# Patient Record
Sex: Female | Born: 1970 | Race: White | Hispanic: No | Smoking: Former smoker
Health system: Southern US, Community
[De-identification: ages and names within clinical notes are randomized; demographics above are authoritative.]

## PROBLEM LIST (undated history)

## (undated) DIAGNOSIS — R55 Syncope and collapse: Secondary | ICD-10-CM

## (undated) DIAGNOSIS — M1990 Unspecified osteoarthritis, unspecified site: Secondary | ICD-10-CM

## (undated) DIAGNOSIS — F32A Depression, unspecified: Secondary | ICD-10-CM

## (undated) DIAGNOSIS — R413 Other amnesia: Secondary | ICD-10-CM

## (undated) DIAGNOSIS — J449 Chronic obstructive pulmonary disease, unspecified: Secondary | ICD-10-CM

## (undated) DIAGNOSIS — R519 Headache, unspecified: Secondary | ICD-10-CM

## (undated) DIAGNOSIS — G9050 Complex regional pain syndrome I, unspecified: Secondary | ICD-10-CM

## (undated) DIAGNOSIS — K449 Diaphragmatic hernia without obstruction or gangrene: Secondary | ICD-10-CM

## (undated) DIAGNOSIS — J189 Pneumonia, unspecified organism: Secondary | ICD-10-CM

## (undated) DIAGNOSIS — R32 Unspecified urinary incontinence: Secondary | ICD-10-CM

## (undated) DIAGNOSIS — I517 Cardiomegaly: Secondary | ICD-10-CM

## (undated) DIAGNOSIS — F4310 Post-traumatic stress disorder, unspecified: Secondary | ICD-10-CM

## (undated) DIAGNOSIS — J45909 Unspecified asthma, uncomplicated: Secondary | ICD-10-CM

## (undated) DIAGNOSIS — M179 Osteoarthritis of knee, unspecified: Secondary | ICD-10-CM

## (undated) DIAGNOSIS — Z8709 Personal history of other diseases of the respiratory system: Secondary | ICD-10-CM

## (undated) DIAGNOSIS — E785 Hyperlipidemia, unspecified: Secondary | ICD-10-CM

## (undated) DIAGNOSIS — G4730 Sleep apnea, unspecified: Secondary | ICD-10-CM

## (undated) DIAGNOSIS — R296 Repeated falls: Secondary | ICD-10-CM

## (undated) DIAGNOSIS — F0781 Postconcussional syndrome: Secondary | ICD-10-CM

## (undated) DIAGNOSIS — K589 Irritable bowel syndrome without diarrhea: Secondary | ICD-10-CM

## (undated) DIAGNOSIS — M1710 Unilateral primary osteoarthritis, unspecified knee: Secondary | ICD-10-CM

## (undated) DIAGNOSIS — Z87442 Personal history of urinary calculi: Secondary | ICD-10-CM

## (undated) DIAGNOSIS — G4700 Insomnia, unspecified: Secondary | ICD-10-CM

## (undated) DIAGNOSIS — R0600 Dyspnea, unspecified: Secondary | ICD-10-CM

## (undated) DIAGNOSIS — F329 Major depressive disorder, single episode, unspecified: Secondary | ICD-10-CM

## (undated) DIAGNOSIS — G319 Degenerative disease of nervous system, unspecified: Secondary | ICD-10-CM

## (undated) DIAGNOSIS — E039 Hypothyroidism, unspecified: Secondary | ICD-10-CM

## (undated) DIAGNOSIS — R569 Unspecified convulsions: Secondary | ICD-10-CM

## (undated) HISTORY — PX: ABDOMINAL HYSTERECTOMY: SHX81

## (undated) HISTORY — PX: KNEE ARTHROSCOPY: SUR90

## (undated) HISTORY — PX: APPENDECTOMY: SHX54

## (undated) HISTORY — PX: REPAIR TENDONS LEG: SUR1210

---
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
## 1998-01-22 HISTORY — PX: LAPAROSCOPIC HYSTERECTOMY: SHX1926

## 1998-04-13 ENCOUNTER — Other Ambulatory Visit: Admission: RE | Admit: 1998-04-13 | Discharge: 1998-04-13 | Payer: Self-pay | Admitting: Obstetrics and Gynecology

## 1998-05-27 ENCOUNTER — Other Ambulatory Visit: Admission: RE | Admit: 1998-05-27 | Discharge: 1998-05-27 | Payer: Self-pay | Admitting: Obstetrics and Gynecology

## 1998-06-14 ENCOUNTER — Encounter: Payer: Self-pay | Admitting: Surgery

## 1998-06-14 ENCOUNTER — Ambulatory Visit (HOSPITAL_COMMUNITY): Admission: RE | Admit: 1998-06-14 | Discharge: 1998-06-14 | Payer: Self-pay | Admitting: Surgery

## 1998-06-15 ENCOUNTER — Ambulatory Visit (HOSPITAL_COMMUNITY): Admission: RE | Admit: 1998-06-15 | Discharge: 1998-06-16 | Payer: Self-pay | Admitting: Surgery

## 1998-09-05 ENCOUNTER — Other Ambulatory Visit: Admission: RE | Admit: 1998-09-05 | Discharge: 1998-09-05 | Payer: Self-pay | Admitting: Obstetrics and Gynecology

## 1998-09-28 ENCOUNTER — Other Ambulatory Visit: Admission: RE | Admit: 1998-09-28 | Discharge: 1998-09-28 | Payer: Self-pay | Admitting: Obstetrics and Gynecology

## 1998-09-28 ENCOUNTER — Encounter (INDEPENDENT_AMBULATORY_CARE_PROVIDER_SITE_OTHER): Payer: Self-pay

## 1999-03-10 ENCOUNTER — Other Ambulatory Visit: Admission: RE | Admit: 1999-03-10 | Discharge: 1999-03-10 | Payer: Self-pay | Admitting: Obstetrics and Gynecology

## 1999-05-02 ENCOUNTER — Encounter (INDEPENDENT_AMBULATORY_CARE_PROVIDER_SITE_OTHER): Payer: Self-pay | Admitting: Specialist

## 1999-05-02 ENCOUNTER — Other Ambulatory Visit: Admission: RE | Admit: 1999-05-02 | Discharge: 1999-05-02 | Payer: Self-pay | Admitting: *Deleted

## 1999-07-02 ENCOUNTER — Emergency Department (HOSPITAL_COMMUNITY): Admission: EM | Admit: 1999-07-02 | Discharge: 1999-07-02 | Payer: Self-pay | Admitting: Emergency Medicine

## 1999-08-10 ENCOUNTER — Observation Stay (HOSPITAL_COMMUNITY): Admission: RE | Admit: 1999-08-10 | Discharge: 1999-08-11 | Payer: Self-pay | Admitting: Obstetrics and Gynecology

## 1999-08-10 ENCOUNTER — Encounter (INDEPENDENT_AMBULATORY_CARE_PROVIDER_SITE_OTHER): Payer: Self-pay

## 1999-10-26 ENCOUNTER — Other Ambulatory Visit: Admission: RE | Admit: 1999-10-26 | Discharge: 1999-10-26 | Payer: Self-pay | Admitting: Obstetrics and Gynecology

## 2000-01-23 HISTORY — PX: FOOT SURGERY: SHX648

## 2000-05-02 ENCOUNTER — Encounter: Admission: RE | Admit: 2000-05-02 | Discharge: 2000-05-02 | Payer: Self-pay | Admitting: General Practice

## 2000-05-02 ENCOUNTER — Encounter: Payer: Self-pay | Admitting: General Practice

## 2000-05-10 ENCOUNTER — Encounter: Payer: Self-pay | Admitting: Urology

## 2000-05-10 ENCOUNTER — Encounter: Admission: RE | Admit: 2000-05-10 | Discharge: 2000-05-10 | Payer: Self-pay | Admitting: Urology

## 2000-12-06 ENCOUNTER — Other Ambulatory Visit: Admission: RE | Admit: 2000-12-06 | Discharge: 2000-12-06 | Payer: Self-pay | Admitting: Obstetrics and Gynecology

## 2001-01-16 ENCOUNTER — Encounter: Admission: RE | Admit: 2001-01-16 | Discharge: 2001-01-16 | Payer: Self-pay | Admitting: Family Medicine

## 2001-01-16 ENCOUNTER — Encounter: Payer: Self-pay | Admitting: Family Medicine

## 2002-05-26 ENCOUNTER — Ambulatory Visit (HOSPITAL_BASED_OUTPATIENT_CLINIC_OR_DEPARTMENT_OTHER): Admission: RE | Admit: 2002-05-26 | Discharge: 2002-05-26 | Payer: Self-pay | Admitting: Orthopedic Surgery

## 2005-01-26 ENCOUNTER — Ambulatory Visit (HOSPITAL_BASED_OUTPATIENT_CLINIC_OR_DEPARTMENT_OTHER): Admission: RE | Admit: 2005-01-26 | Discharge: 2005-01-26 | Payer: Self-pay | Admitting: Orthopedic Surgery

## 2005-01-31 ENCOUNTER — Ambulatory Visit (HOSPITAL_COMMUNITY): Admission: RE | Admit: 2005-01-31 | Discharge: 2005-01-31 | Payer: Self-pay | Admitting: Orthopedic Surgery

## 2005-02-15 ENCOUNTER — Ambulatory Visit (HOSPITAL_BASED_OUTPATIENT_CLINIC_OR_DEPARTMENT_OTHER): Admission: RE | Admit: 2005-02-15 | Discharge: 2005-02-16 | Payer: Self-pay | Admitting: Orthopedic Surgery

## 2006-05-12 ENCOUNTER — Emergency Department (HOSPITAL_COMMUNITY): Admission: EM | Admit: 2006-05-12 | Discharge: 2006-05-12 | Payer: Self-pay | Admitting: Emergency Medicine

## 2006-10-13 ENCOUNTER — Emergency Department (HOSPITAL_COMMUNITY): Admission: EM | Admit: 2006-10-13 | Discharge: 2006-10-13 | Payer: Self-pay | Admitting: Emergency Medicine

## 2006-11-05 ENCOUNTER — Ambulatory Visit: Payer: Self-pay | Admitting: Physical Medicine & Rehabilitation

## 2006-11-05 ENCOUNTER — Encounter
Admission: RE | Admit: 2006-11-05 | Discharge: 2007-02-03 | Payer: Self-pay | Admitting: Physical Medicine & Rehabilitation

## 2006-12-09 ENCOUNTER — Encounter: Admission: RE | Admit: 2006-12-09 | Discharge: 2007-03-09 | Payer: Self-pay | Admitting: Anesthesiology

## 2006-12-10 ENCOUNTER — Ambulatory Visit: Payer: Self-pay | Admitting: Anesthesiology

## 2006-12-23 ENCOUNTER — Ambulatory Visit: Payer: Self-pay | Admitting: Physical Medicine & Rehabilitation

## 2007-01-29 ENCOUNTER — Encounter
Admission: RE | Admit: 2007-01-29 | Discharge: 2007-04-07 | Payer: Self-pay | Admitting: Physical Medicine & Rehabilitation

## 2007-01-29 ENCOUNTER — Ambulatory Visit: Payer: Self-pay | Admitting: Physical Medicine & Rehabilitation

## 2007-02-11 ENCOUNTER — Ambulatory Visit: Payer: Self-pay | Admitting: Anesthesiology

## 2008-03-20 ENCOUNTER — Encounter: Admission: RE | Admit: 2008-03-20 | Discharge: 2008-03-20 | Payer: Self-pay | Admitting: Family Medicine

## 2008-10-04 ENCOUNTER — Encounter: Admission: RE | Admit: 2008-10-04 | Discharge: 2008-10-04 | Payer: Self-pay | Admitting: Emergency Medicine

## 2009-03-02 ENCOUNTER — Emergency Department (HOSPITAL_COMMUNITY): Admission: EM | Admit: 2009-03-02 | Discharge: 2009-03-02 | Payer: Self-pay | Admitting: Emergency Medicine

## 2009-12-28 ENCOUNTER — Emergency Department (HOSPITAL_COMMUNITY)
Admission: EM | Admit: 2009-12-28 | Discharge: 2009-12-29 | Payer: Self-pay | Source: Home / Self Care | Admitting: Emergency Medicine

## 2010-01-22 HISTORY — PX: HIP SURGERY: SHX245

## 2010-02-12 ENCOUNTER — Encounter: Payer: Self-pay | Admitting: Orthopaedic Surgery

## 2010-04-04 LAB — POCT I-STAT, CHEM 8
Chloride: 103 mEq/L (ref 96–112)
Creatinine, Ser: 0.7 mg/dL (ref 0.4–1.2)
HCT: 40 % (ref 36.0–46.0)
Potassium: 3.3 mEq/L — ABNORMAL LOW (ref 3.5–5.1)
Sodium: 139 mEq/L (ref 135–145)

## 2010-04-14 LAB — URINALYSIS, ROUTINE W REFLEX MICROSCOPIC
Glucose, UA: NEGATIVE mg/dL
Specific Gravity, Urine: 1.025 (ref 1.005–1.030)
Urobilinogen, UA: 0.2 mg/dL (ref 0.0–1.0)
pH: 6.5 (ref 5.0–8.0)

## 2010-04-14 LAB — DIFFERENTIAL
Basophils Absolute: 0 10*3/uL (ref 0.0–0.1)
Basophils Relative: 0 % (ref 0–1)
Eosinophils Absolute: 0.3 10*3/uL (ref 0.0–0.7)
Eosinophils Relative: 2 % (ref 0–5)
Lymphs Abs: 2 10*3/uL (ref 0.7–4.0)
Neutrophils Relative %: 73 % (ref 43–77)

## 2010-04-14 LAB — CBC
HCT: 45.3 % (ref 36.0–46.0)
MCHC: 34.7 g/dL (ref 30.0–36.0)
Platelets: 158 10*3/uL (ref 150–400)
RDW: 13.5 % (ref 11.5–15.5)

## 2010-04-14 LAB — URINE MICROSCOPIC-ADD ON

## 2010-04-14 LAB — BASIC METABOLIC PANEL
BUN: 5 mg/dL — ABNORMAL LOW (ref 6–23)
Chloride: 104 mEq/L (ref 96–112)
Potassium: 3.4 mEq/L — ABNORMAL LOW (ref 3.5–5.1)
Sodium: 136 mEq/L (ref 135–145)

## 2010-06-06 NOTE — Procedures (Signed)
NAMEREVIA, NGHIEM NO.:  [id number]   MEDICAL RECORD NO.:  [id number]          PATIENT TYPE:  REC   LOCATION:  [facility]                          FACILITY:  [facility]   PHYSICIAN:  Celene Kras, MD        DATE OF BIRTH:  [birth year]   DATE OF PROCEDURE:  02/11/2007  DATE OF DISCHARGE:                               OPERATIVE REPORT   PATIENT:  Katie Neal   DATE OF BIRTH:  [birth year]   SURGEON:  Jewel Baize. Stevphen Rochester, M.D.   [facility].  I evaluated her  via health and history form and 14-point review of systems.   I spent a pretty considerable amount of time discussing treatment  limitations and alternative options with her.  I reviewed the chart,  progress to date, and overall directed care approach.  We had trialled a  cervical facet medial branch intervention with modest relief cycling,  although she does have evidence of mechanical pain, exam and historical  features suggest.  I do not think that that was a rewarding injection  for [place], and I now going to go to the central compartment, that being  cervical epidural, to attenuate the suprascapular, levator scapular and  paracervical myofascial disorder, which I think is probably contributory  to her oral and neck pain.  She has no advancing neurological features.  I have used models and discussed in lay terms.  Questions were answered  with no barrier to communication.  I do not plan another intervention  after this.  I have discussed it with Dr. Wynn Banker, and he will follow  up with her.  She is requesting substantial escalation of controlled  substances, fentanyl, OxyContin and the like.  I will defer to Dr.  Wynn Banker, but as I relate to her and we have a conversation about this,  narcotics at her age a pretty much a dead end, particularly with this  issue.  We want to emphasize nonnarcotic medication alternatives and  multimodality approaches to treating her  pain, and she is an individual  I think would benefit from psychiatric or psychological support system.  States no wish to harm self or others.  She and her husband are  attempting to quit smoking as a positive outcome-predictive element.  That will be mandatory for best outcome.  Questions were answered,  discussed in lay terms.  I have used models.  No barrier to  communication.   OBJECTIVE:  Diffuse paracervical, suprascapular myofascial with a  positive cervical facetal compression test left to right.  Levator  scapular pain noted.  Her typical pain without any significant change in  neurological or musculoskeletal perspective.   IMPRESSION:  1. 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  3. TMJ.  Situational impression.   PROCEDURE:  Cervical epidural, fluoroscopically guided.  She is  consented.   The patient taken to the fluoroscopy suite and placed in the prone  position, the back prepped and draped in  the usual fashion.  Using a  Hustead needle I advance to the C7-T1 interspace without any evidence of  CSF, heme or paresthesia.  Test block uneventfully.  I follow with 40 mg  of Aristocort and flush the needle.   Tolerated the procedure well.  No complications from our procedure.   PLAN:  Will assess within the context of activities of daily living.  Expectations reviewed.  Questions were answered.           ______________________________  Celene Kras, MD     HH/MEDQ  D:  02/11/2007 11:05:33  T:  02/11/2007 13:33:15  Job:  [id number]

## 2010-06-06 NOTE — Assessment & Plan Note (Signed)
Katie Neal returns today, 40 year old female, with chronic  postoperative pain.  She has rather diffuse pain but no significant  fibromyalgia tender points or trigger points.  She has had a C-spine MRI  showing no evidence of compressive lesion or other significant  abnormalities.  She did have cervical facet injections which really did  not help her.  She has had some exacerbation with pain but this started  more than a week after the injections.  She has had some relief with  trigger point injections in the left upper trapezius area.  She has had  jaw pain and has been recently referred to [facility] for further  evaluation.   Her pain distribution is in the lower jaw area, posterior occiput, mid  back, bilateral knees ankles.  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  She  does indicate need for assistance with ADLs, bathing, meal preparation,  household duties and shopping.  Her review of systems is positive for  numbness, trouble walking, confusion, depression, anxiety, diarrhea,  constipation, abdominal pain.   VITAL SIGNS:  Her blood pressure is 134/75, pulse 107, respiratory rate  18, O2 saturation is 96% on room air.  GENERAL:  In no acute distress.  Mood and affect appropriate but mildly  anxious.  Gait shows no toe drag or knee instability.  She is able to  toe-walk and heel-walk.  Deep tendon reflexes are normal. Cranial nerves  II-XII are normal.  NECK:  Tenderness to palpation to left base of occiput as well as upper  trapezius more towards the medial aspect 3-4 cm lateral C7 spinous  process.  NEURO:  Deep tendon reflexes are 3+ bilateral upper and lower  extremities.   IMPRESSION:  1. Myofascial pain syndrome.  2. Diffuse complaints of widespread body pain. Will check arthritis      panel and TSH to see if she has any evidence  of hypothyroidism or      inflammatory arthropathy although I have never seen any type of hot      or swollen joints.  3. weakness, episodic in the legs.  Will check EMG.  No obvious      findings on neuro exam.  C-spine MRI is normal.   Will wean off of Lyrica given she does not feel any significant relief  with that. Will initiate Topamax, she does have some headaches, this may  be helpful and some prophylaxis, start at 25 q.h.s. while she is weaning  down on her Lyrica and then we can go up higher to 25 b.i.d.      [name], M.D.  Electronically Signed     AEK/MedQ  D:  12/24/2006 16:17:59  T:  12/24/2006 20:24:55  Job #:  [id number]   cc:   Celene Kras, MD  Fax: [phone]   Marcos Eke. Hal Hope, M.D.  Fax: [phone]

## 2010-06-06 NOTE — Assessment & Plan Note (Signed)
HISTORY:  [name] is a 40 year old female who returns today with  pain primarily in the back, headache pain and right knee pain at this  point.  She has had pain at other visits in other areas such as the  hands and the feet, but these are not as problematic at this point.  She  has had an ACL repair of the right knee on February 15, 2005 and she  feels like she is having some increased clicking on that area.   At last visit, I performed an EMG of the bilateral peroneal and tibial  nerves, as well as sural and superficial peroneal nerves and left lower  extremity needle EMG which was normal.  Because of the weakness type of  symptoms that she has in her lower extremities, we went ahead and did an  MRI of the lumbar spine which was normal on January 20, 2007.   She has followed up with Dr. Vickey Huger in the interval time and she is  recommending a fentanyl patch per the patient's report, as well as  continuation of Topamax and Robaxin.   On the last visit, we also tried switching her off of the Effexor and  putting her on the Cymbalta.  We weaned down on the Effexor dose as we  were going up on the Cymbalta dose.  She was on 30 mg of Cymbalta and 75  of Effexor.  She states that the Cymbalta helped some with the pain,  however, did not help with the anxiety as much as the Effexor.  She has  also seen Dr. Stevphen Rochester who added some Klonopin last visit.   Furthermore, she is scheduled for a sleepy study this month with Dr.  Vickey Huger.   CURRENT MEDICATIONS:  1. She went back on her Effexor 25 mg by mouth daily per her primary      care physician.  She claims to have called here five times, but I      only have one recorded and this is in regards to another issue.  2. Lyrica 150 mg 3 times a day.  3. Topamax 25 mg twice a day.  4. Darvocet-N 100 twice a day.  5. Robaxin 500 mg 4 times a day.   Her average pain is 4 out of 10, and mainly headache, low back and right  knee.  The pain is  worse with both inactivity and activity.  The relief  with medications is fair to good.  She will walk for 20 minutes at a  time.  She avoids steps.  She drives.  She works 55-60 hours a week.  She indicates that she needs dressing, bathing, meal prep, household  duty and shopping help, but this is intermittent and depends on the day.   She has numbness, trouble walking, spasms, tingling,__________  anxiety  and diarrhea, as well as some weight loss over the last couple of  months.   PHYSICAL EXAMINATION:  VITAL SIGNS:  Blood pressure is 111/75, pulse 86,  respirations 18, O2 saturation 98% on room air.  GENERAL:  The patient is in no acute distress.  Mood and affect are  flat.  NECK:  Neck range of motion is full.  She has some mild tenderness to palpation over the bilateral upper  trapezius and lumbar and thoracic paraspinals, as well as over the  gluteus medius and greater trochanter area.  Her upper and lower  extremity range of motion is good.  I do not palpate any  Baker's cysts  in the popliteal fossa and there is no evidence of knee effusion.  Deep tendon reflexes are 2+ in the bilateral biceps, triceps, brachial  radialis, patellar and Achilles.  Her strength is 5-/5 in the bilateral  deltoid, biceps, triceps, wrists, hip flexion, ankle dorsiflexion, knee  extension.   IMPRESSION:  1. Chronic pain syndrome.  She has features consistent with      fibromyalgia syndrome, although not all of the classic tender      points are active.  Certainly, there is a central sensitization of      pain and I think that she should be treated similarly.  2. Anxiety/depression.  I think that this is impacting her pain and      certainly intertwined and she can benefit from Psychiatry      evaluation for that.  3. Poor sleep, awaiting sleep study.  She will follow up with Dr.      Vickey Huger.  In addition, the patient would like to follow the      suggestions of some medication changes with Dr.  Vickey Huger and she      will follow up with her on that.  4. In terms of her exercise level, I think that she would benefit from      aquatic therapy, fibromyalgia program or low impact program that      she could do at her local [facility] in [place].   We will continue the following medications:  1. Lyrica 150 mg 3 times a day.  2. Topamax 25 mg twice a day.  3. Darvocet-N 100 twice a day.  4. Robaxin 500 mg 4 times a day.   I have given her three refills on each, with the exception of the  Darvocet.  I will see her on a p.r.n. basis.  She will see how she does  with the other physicians who will see her.  I am also awaiting input  from Dentistry.      [name], M.D.  Electronically Signed     AEK/MedQ  D:  01/30/2007 11:27:56  T:  01/30/2007 12:45:49  Job #:  [id number]   cc:   Melvyn Novas, M.D.  Fax: [phone]   Marcos Eke. Hal Hope, M.D.  Fax: [phone]   Marlyn Corporal, DDS  [facility]  [address]  [place], [name] [address]

## 2010-06-06 NOTE — Procedures (Signed)
Katie Neal, Katie Neal            ACCOUNT NO.:  [id number]   MEDICAL RECORD NO.:  [id number]          PATIENT TYPE:  REC   LOCATION:  [facility]                          FACILITY:  [facility]   PHYSICIAN:  [name], M.D.DATE OF BIRTH:  1970/02/22   DATE OF PROCEDURE:  DATE OF DISCHARGE:                               OPERATIVE REPORT   This is a right upper trapezius injection as well as right splenius  capitis injection.   The area was marked and prepped with Betadine.  Entered with a 25 gauge  1-1/2 inch needle, inserted to 1/2 inch depth, and 0.5 cc of 1%  lidocaine injected in each site.  Patient tolerated the procedure well.  Injection was done after negative drawback of blood.   Post injection instructions given.      [name], M.D.  Electronically Signed     AEK/MEDQ  D:  12/24/2006 16:19:09  T:  12/24/2006 22:11:05  Job:  [id number]

## 2010-06-06 NOTE — Assessment & Plan Note (Signed)
Katie Neal returns today.  I saw her in initial consultation November 05, 2006.  She is a 40 year old female, who has had Achilles tendon  rupture in the past, history of peroneus longus tendinitis, status post  tenosynovectomy per Dr. Lestine Box, left sural neurolysis, May 26, 2002.  She has had chondroplasty, left medial femoral condyle, right knee.  She  has had ACL repair of right knee, as well.   She also has TMJ syndrome.  We increased her Lyrica to 150 t.i.d.  She  is not sure whether this really helped her much.  We sent her for a C-  spine MRI, given the history of neck pain and left-arm pain.  She has  had some increased deep tendon reflexes as well.   Her pain is about 8/10 in the head area, as well as neck, less so in the  hands and feet, but probably next worst part is her knees.  Her sleep is  poor.  Her pain is worse with walking, prolonged bending, sitting and  standing.  She can walk five minutes at a time.  She drives.  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  She has some difficulty on  certain days with dressing, household duties and shopping.   REVIEW OF SYSTEMS:  Positive for numbness, tremor, tingling, trouble  walking, spasms, dizziness, confusion, depression, anxiety, nausea,  diarrhea, constipation.  She states she has sleep apnea.  She has  frequent daytime falling-asleep episodes, but has never had a sleep  study.   EXAMINATION:  Blood pressure 120/69, pulse 100, respiratory rate is 18,  O2 sat 97% on room air.  GENERAL:  No acute distress.  MOOD AND AFFECT:  Flat and tired.  She walks slowly, but has no evidence of toe-drag or knee instability.  Upper extremity and lower extremity strength are normal.  Deep tendon  reflexes are normal.  Sensation diminished, left lateral ankle.  Spine shows no evidence of scoliosis.  Lumbar range of motion is good,  but she does have pain at end-range flexion/extension of the lumbar  spine.  She has some  tenderness over the left TMJ, not over the right TMJ.  She  can open up her jaw somewhat asymmetrically.  Dentition appears intact.   IMPRESSION:  1. Chronic diffuse pain.  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  Today, she      has more signs of fibromyalgia syndrome in terms of being painful      in all the fibromyalgia tender points, but also tender in other      areas.  2. TMJ type pain.  3. Reviewed C-spine MRI.  Basically no evidence of disc herniation or      central stenosis.  She has some mild spondylosis at C4-5 and C5-6,      but overall not too impressive, and I impressed upon her that this      is really fairly normal for her age.   She has not followed through with physical therapy yet.  I have asked  her to go there for TENS evaluation.   In terms of medication, would avoid narcotic analgesics.  She already  has problems with falling asleep during the daytime hours.  I will send  her for a sleep study to neurology.  She is okay on her medications  right now.  I have recommended we switch from Robaxin to Skelaxin and  she will be on 800 t.i.d.   I will see  her back in followup and see how she is doing on Skelaxin and  then, at that point, start weaning down on the diazepam.      [name], M.D.  Electronically Signed     AEK/MedQ  D:  11/19/2006 16:53:07  T:  11/20/2006 09:48:33  Job #:  [id number]   cc:   Clydie Braun L. Hal Hope, M.D.  Fax: [phone]   Melvyn Novas, M.D.  Fax: [phone]

## 2010-06-06 NOTE — Procedures (Signed)
[name], CHIM NO.:  [id number]   MEDICAL RECORD NO.:  [id number]          PATIENT TYPE:  REC   LOCATION:  [facility]                          FACILITY:  [facility]   PHYSICIAN:  Celene Kras, MD        DATE OF BIRTH:  1970-08-01   DATE OF PROCEDURE:  DATE OF DISCHARGE:                               OPERATIVE REPORT   1. [facility]. I      evaluated and reviewed her health and history form and 14-point      review of systems.  I reviewed the chart, progress to date, imaging      reports and overall directive care approach.  She is complaining of      right lateralizing paracervical discomfort that extends up to the      jaw region, with a mixed mechanical diskogenic overlay modest      spondylosis, but I do think exam and historical features support a      facetal overlay, that could explain her paracervical and myofascial      pain, and possibly some of her facial pain.  I think the least      invasive, in which the risk reward benefit remains in her favor      would be to block diagnostic therapeutic facet medial branch.  This      would be at 3, 4, 5 and 6 with contributory innervation addressed.      She has a suprascapular, levator scapular extension, and pain with      side bending that suggests this.  2. Other modifiable features in the health profile are going to be      mandatory for best outcome and that includes cigarette cessation      and she will see her family doctor regarding Chantix and other      options.  3. Assessment in context of activities of daily living.  Consider      adding adjuncts such as Lyrica and Lidoderm, and she may have tried      these,  she is not sure.  We will see how she does here.  She will      maintain contact with primary care and our physiatry colleagues.      We may go on to a facial intervention, as the surgery is being      contemplated at [facility] and she wants to exhaust  conservative      management.  That is reasonable, we will do what we can to help      her.   Objectively diffuse paracervical myofascial discomfort with positive  cervical facetal compression test right greater than left, suboccipital  compression test positive.  Suprascapular and levator scapular  extension.  She has a modest trigger to her TMJ affected side, right  side, but nothing new neurologically.   IMPRESSION:  Degenerative spine disease cervical spine and cervical  facet syndrome, probable temporomandibular joint with myofascial  extension from both components.   PLAN:  As outlined above.  Discharge instructions reviewed.  I  have used  models and discussed in lay terms.  Questions were answered, and we will  go ahead and proceed today diagnostic and therapeutic. She will let us  know how she does and consider further intervention based on need and  overall response.  She is consented for today's procedure.   The patient taken to the fluoroscopy suite and placed in the supine  position, neck prepped and draped in the usual fashion. Using a 25-gauge  needle, I advanced to the cervical facet at the medial branch and third  occipital nerve, 3, 4, 5 and 6 with contributory innervation addressed.  Right side.  Under local anesthetic, no CSF, heme or paresthesia.  Test  block uneventfully followed with 1/2 mL lidocaine 1% MPF at each level  with a total of 40 mg of Aristocort in divided dose.   She tolerated the procedure well. No complications from our procedure.  Discharge instructions given. Assess within context of activities of  daily living.           ______________________________  Celene Kras, MD     HH/MEDQ  D:  12/10/2006 10:47:04  T:  12/10/2006 16:14:33  Job:  [id number]

## 2010-06-06 NOTE — Assessment & Plan Note (Signed)
[facility].  I evaluated her via  health and  history form , 14-point review of systems.  1. We considered going onto another facet medial branch intervention,      but I am going to hold off.  I think I next logical step would be      to consider cervical epidural as a broad brush stroke to diminish      the amplification of her pain, but at this time she has quite a bit      of situational anxiety, many questions about her pain control, and      I have a discussion with her with Dr. Wynn Banker.  I am going to      defer to his judgement regarding pain control, and he has sent a      letter to our colleagues at [facility], we await evaluation for her TMJ      intervention.  2. I will go ahead and give her some Klonopin to use sparingly.  She      is to use 1, at b.i.d. dosing interval or 1 q.day, based on need,      and I think anxiety seems to be our overlying component.  3. I talked frankly and openly about modifiable features and health      profile such as cigarette cessation which is going to be mandatory      for best outcome.   OBJECTIVELY:  GENERAL:  She does have her triggers, diffuse paracervical  myofascial, particularly left greater than right with side bending,  super scapular, beta scapular mixed with mechanical pain.  TMJ pain she  has an oral device in place.  NEURO:  I do not see anything new neurologically.   IMPRESSION:  Degenerative spine disease, cervical spine with facet  overlay.  Mechanical pain, temporomandibular joint.   PLAN:  Conservative management.  Consider epidural in followup.  We want  her to see the folks at [facility].  I will add Klonopin to assist in  anxiolysis.  Maintain contact with Dr. Wynn Banker.  Modifiable features  in health profile will also be discussed with primary care.           ______________________________  Katie Kras, MD     HH/MedQ  D:  01/07/2007 10:46:39  T:  01/07/2007 11:32:32  Job #:   [id number]
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
## 2010-06-09 NOTE — Op Note (Signed)
[facility]  Patient:    Katie Neal, Katie Neal                   MRN: [id number] Proc. Date: 08/10/99 Adm. Date:  [id number] Disc. Date: [id number] Attending:  Lenoard Aden CC:         Lenoard Aden, M.D.                           Operative Report  PREOPERATIVE DIAGNOSIS:       Chronic right lower quadrant pain, with                               persistent dysplasia, secondary dysmenorrhea                               and menorrhagia, refractory to hormonal therapy,                               and a desire for elective sterilization.  POSTOPERATIVE DIAGNOSIS:      Chronic right lower quadrant pain, with                               persistent dysplasia, secondary to dysmenorrhea,                               and menorrhagia, refractory to hormonal therapy,                               and a desire for elective sterilization.  PROCEDURE:                    Laparoscopically-assisted vaginal hysterectomy.  SURGEON:                      Lenoard Aden, M.D.  ASSISTANT:                    Marina Gravel, M.D.  ANESTHESIA:                   General.  ESTIMATED BLOOD LOSS:         100 cc.  COMPLICATIONS:                None.  URINE OUTPUT:                 400 cc.  CRYSTALLOID:                  2400 cc in.  The patient to the recovery room in good condition.  All counts are correct.  DESCRIPTION OF PROCEDURE:     The risks of anesthesia, infection, bleeding, injury to the abdominal wall, with need for repair.  Possible risks to include bowel and bladder injury with need for repair.  The patient is brought to the operating room. She is also noted a possible inability to cure pain.  At this time she is brought to the operating room where she is administered a general anesthetic without complications.  She is prepped and draped in the usual  sterile fashion.  Her feet are placed in the Vernon stirrups.  Examination under anesthesia  reveals a boggy  retroflexed uterus.  No adnexal masses.  The Hulka tenaculum is placed per vagina after the patient is prepped and draped in the usual sterile fashion.  The Foley catheter is placed.  The infraumbilical incision is made with the scalpel.  The  Veress needle placed.  The opening pressure of -2 noted.  Three L of CO2 insufflated without difficulty.  The trocar entry is atraumatic.  Pictures are taken.  Absent appendix.  Normal liver, gallbladder bed noted.  The pelvis appears to have a boggy retroflexed uterus.  Normal tubes and ovaries.  At this time the 5 mm trocar entry is made bilaterally in the lower quadrants atraumatically under  direct visualization.  Tripolar cautery.  Entered, and the round ligaments are taken down bilaterally, coagulated, and cut.  The tubo-ovarian round ligament complexes bilaterally cut and coagulated.  Progressive desiccation, coagulation, and cutting of tissue along the broad ligaments down to the area of the uterine  vessels is taken after identifying both ureters bilaterally.  The bladder flap s developed sharply.  Attention is turned to the vagina whereby Christella Hartigan tenaculum is placed on the anterior and posterior lip of the cervix.  The cervical-vaginal junction is infiltrated using a dilute Pitressin solution, and scored using a scalpel.  The  posterior cul-de-sac is entry made atraumatically.  A long weighted speculum is  placed.  The uterosacral ligaments are bilaterally clamped, suture ligated, and  transfixed to the vaginal cuff.  The anterior cul-de-sac entry made atraumatically. The Deaver retractor placed.   The uterine vessels bilaterally clamped and suture ligated.  The Cardinal ligaments bilaterally clamped and suture ligated.  The specimen is removed.  The McCullough culdoplasty stitch placed using a #0 Vicryl suture and tied.  The vagina was closed using #0 Vicryl sutures in an interrupted fashion.  Good  hemostasis established.  Packing coated with Premarin cream placed, and attention then turned to the abdominal portion of the procedure.   All pedicles were inspected and found to be hemostatic.  Pictures were taken.  The instruments were removed.  The incisions were closed using #0 and #4-0 Vicryl.  Dilute Marcaine solution placed.  The patient tolerated the procedure well and went to the recovery room in good condition. DD:  08/10/99 TD:  08/12/99 Job: [id number]

## 2010-06-09 NOTE — Op Note (Signed)
NAMEDELEAH, Katie Neal            ACCOUNT NO.:  [id number]   MEDICAL RECORD NO.:  [id number]          PATIENT TYPE:  AMB   LOCATION:  DSC                          FACILITY:  [facility]   PHYSICIAN:  Rodney A. Mortenson, M.D.DATE OF BIRTH:  [birth year]   DATE OF PROCEDURE:  01/26/2005  DATE OF DISCHARGE:                                 OPERATIVE REPORT   PREOPERATIVE DIAGNOSIS:  Painful right knee.   POSTOPERATIVE DIAGNOSIS:  Loose  anterior cruciate ligament, chondromalacia  medial femoral condyle.   OPERATION:  Diagnostic arthroscopy; chondroplasty medial femoral condyle of  the right knee.   SURGEON:  Lenard Galloway. Chaney Malling, M.D.   ANESTHESIA:  MAC.   PATHOLOGY:  With patient asleep, stability of the knee was tested.  Patient  does have a positive Lachman on the right, not on the left.  Pivot shift in  AP drawer, however, are negative.   With the arthroscope in the knee, very careful examination of the  knee was  undertaken.  The articular cartilage on the posterior aspect of the patella  and the femoral notch absolutely normal.  Both medial and lateral  compartments were visualized and the articular cartilage and tibia plateau  on both sides was normal. Both medial and lateral meniscus was normal.  Articular cartilage over the lateral femoral condyle was normal.  On the  medial femoral condyle was some grade I cartilage changes.  Initially the  ACL appeared very normal but it was tapered at its origin on the lateral  femoral condyle with traction on the ACL with a probe.  There was some  slight looseness but when a drawer sign was done, the tibia definitely slid  forward significantly.  It appeared this ACL has been stretched, although  still has some functional ability but is not normal.   DESCRIPTION OF PROCEDURE:  Patient placed on the operating table in the  supine position.  Pneumatic tourniquet about the right upper thigh.  Right  leg is placed in leg holder.  Right lower  extremity is prepped with DuraPrep  and draped out in the usual manner.  An infusion cannula is placed in the  superior medial pouch.  An anterior medial and anterior lateral portal is  made and the arthroscope was introduced.  The findings described as above.  The only significant surgical pathology was some chondromalacia over the  anterior segment of medial femoral condyle.  This debrided lightly with the  chondroplasty shaver.  Again, the drawer sign was quite positive on the  right and the diameter of the ACL at its origin over the lateral femoral  condyle was about half its normal diameter.  There is a definite drawer  present when the tibia was pulled forward on the femur.  No other  significant pathology was seen.  Again, both menisci appeared normal.  Marcaine was then placed in the knee and a large bulky  pressure dressing applied.  The patient then returned to the recovery room  in excellent condition.  Technically, this procedure went extremely well.   FOLLOW UP CARE:  To my office on Wednesday.  ______________________________  Lenard Galloway Chaney Malling, M.D.     RAM/MEDQ  D:  01/26/2005  T:  01/26/2005  Job:  [id number]

## 2010-06-09 NOTE — H&P (Signed)
[facility]  Patient:    Katie Neal, Katie Neal                     MRN: [id number] Adm. Date:  [id number] Disc. Date: [id number] Attending:  Annamarie Dawley CC:         [facility]                         History and Physical  CHIEF COMPLAINT:              Persistent dysplasia, worsening menorrhagia, dysmenorrhea, chronic right-sided pain.  HISTORY OF PRESENT ILLNESS:   The patient is a 40 year old white female, G2, P2, who has completed childbearing who presents with aforementioned symptoms. She has a history of persistent dysplasia documented on colposcopy.  She is status post laparoscopic appendectomy in May 2000 at which time she had a negative pelvic visualization by Dr. Ovidio Kin.  She has had chronic dysfunctional uterine bleeding with a normal work-up unresponsive to therapy and in addition she wishes definitive therapy at this time.  PAST MEDICAL HISTORY:         Her past medical history is remarkable for a history of two spontaneous vaginal deliveries, an Achilles tendon repair.  No history of PID or ectopic pregnancies.  MEDICATIONS:                  Include Ventolin, Vancenase p.r.n., Claritin for allergies.  ALLERGIES:                    She is allergic to CODEINE as well as various environmental agents to include pollen environmental allergies.  FAMILY HISTORY:               She has a family history of heart disease, cardiovascular disease, hypertension and lung cancer.  SOCIAL HISTORY:               She is a smoker.  She denies domestic or violence.  She denies alcohol abuse.  PHYSICAL EXAMINATION:  GENERAL:                      She is a well-developed, well-nourished white female in no apparent distress.  HEENT:                        Normal.  LUNGS:                        Clear.  HEART:                        Regular rhythm.  ABDOMEN:                      Soft, scaphoid and nontender.  PELVIC:                        Examination reveals a normal sized but retroflexed uterus.   No adnexal masses are noted.  EXTREMITIES:                  Reveals no cords.  NEUROLOGICAL:                 Examination is nonfocal.  IMPRESSION:  1. Persistent dysplasia, for definitive                                  therapy.                               2. Chronic right lower quadrant pain,                                  status post normal laparoscopy.                               3. Worsening dysmenorrhea and menorrhagia with                                  no response to hormonal medication.                               4. Dysfunctional uterine bleeding.  PLAN:                         To proceed with laparoscopic-assisted vaginal hysterectomy.  Risks of anesthesia including, infection, bleeding, injury to abdominal organs and need for repair is discussed.  Failure or inability to cure pain are noted.  Delayed versus immediate complications to include bowel or bladder injury with possible need for repair is noted.  The patient acknowledges and desires to proceed. DD:  08/10/99 TD:  08/10/99 Job: [id number]

## 2010-06-09 NOTE — Op Note (Signed)
Katie Neal, Katie Neal            ACCOUNT NO.:  [id number]   MEDICAL RECORD NO.:  [id number]          PATIENT TYPE:  AMB   LOCATION:  DSC                          FACILITY:  [facility]   PHYSICIAN:  Rodney A. Mortenson, M.D.DATE OF BIRTH:  1970-04-10   DATE OF PROCEDURE:  02/15/2005  DATE OF DISCHARGE:                                 OPERATIVE REPORT   PREOPERATIVE DIAGNOSIS:  Ruptured anterior cruciate ligament, right knee.   POSTOPERATIVE DIAGNOSIS:  Ruptured anterior cruciate ligament, right knee.   PROCEDURE:  Allograft ACL reconstruction, right knee.   SURGEON:  Lenard Galloway. Chaney Malling, M.D.   ASSISTANT:  Arlys John D. Petrarca, P.A.-C.   ANESTHESIA:  General.   PATHOLOGY:  With the scope in the knee, very careful examination of the knee  was undertaken.  The patellofemoral junction, both femoral condyles, and  both tibial plateaus were absolutely normal.  Both medial and lateral  meniscus were visualized very carefully, and these were normal.  The ACL was  torn off of its femoral origin laterally.  It was thinned down and loose  proximally and nonfunctional.   DESCRIPTION OF PROCEDURE:  The patient was placed on the operating table in  the supine position with a pneumatic tourniquet about the right upper thigh.  The right leg was placed in a leg holder and the entire right lower  extremity prepped with DuraPrep and draped out in the usual manner.  The leg  was wrapped out with an Esmarch, and an infusion cannula was placed in the  superomedial pouch.   Anteromedial, anterolateral, and midline patella portals were made, and the  arthroscope was introduced.  The findings were as described above.  The  interarticular shaver was introduced, and the ACL was then debrided back  down to a stump on the tibial spine.  Soft tissue of the lateral femoral  condyle was cleaned off meticulously.  A high-speed bur was then inserted,  and a generous lateral notchplasty was done.  The ArthroCare  wand was also  used to control some of the soft tissue.  A great deal of time was spent  doing a very nice notchplasty.  This was extended laterally and anteriorly.  On full extension, there was still room for the graft to fit through.  This  was smoothed off with a rasp.  Simultaneously on the back table, an  allograft was fashioned with 10-mm bony plugs at either end which measured  30 mm, and the graft itself was tubed using absorbable sutures.  Drill holes  were placed through the bony plugs and then placed in a wet saline sponge.  Once the prep of the knee was finished, the external tibial guide was  inserted up against the posterior cruciate ligament.  This was held in  position, and a guidepin was passed up through the external guide and  brought out dead center in the tibial spine in the middle of the anterior  cruciate ligament stump.  This was felt to be anatomic.  An incision was  made about the guidepin.  A 10-mm cannulated reamer was then passed up  through the  tibia.  The interarticular shaver was introduced, and all debris  was removed from the tibial tunnel.  With the knee flexed about 90 degrees,  the femoral guide was passed through the tibial tunnel and placed through  the posterolateral femoral condyle and held in position.  A long Beath  needle was then passed up through the femoral guide and brought out the  anterior aspect of the femur.  In a similar manner, a 10-mm cannulated  reamer was passed over the guidepin through the tibial tunnel up against the  lateral femoral condyle.  A hole was then made which measured 35 mm in  length.  This cannulated reamer was then removed, and the arthroscope was  passed up in the femoral tunnel.  There was good capture of bone at 360  degrees.  No debris was found in the femoral tunnel.  The arthroscope was  then removed.  The graft was taken from the back table, sutures passed  through the eye of the Beath needle, and the graft was  pulled up through the  tibial tunnel up into the femoral tunnel, and this seated very nicely.  The  graft was the perfect length.  Through the anterior midline patella portal,  the flexible guidewire was passed up along the bony plug in the femur.  A 7  x 25-mm cannulated metal screw with a shield was then passed up through the  midline portal over the guide, and this was driven into the lateral femoral  condyle.  An excellent interference fit of the bony plug was achieved.  In a  similar manner, with the knee flexed about 30 degrees, traction placed on  the tibial bony plug.  The guidepin was passed up along the bony plug, and a  7 x 25-mm metal screw was driven in.  Again, an excellent interference fit  was achieved.  There was an extra piece of bone sticking out of the tibial  tunnel, and an ACL saw was then used to remove this bony piece.  Once this  was accomplished, there was good stability of the graft throughout full  range of motion.  Palpation with the probe showed the graft to be very  stable an snug.  The skin was closed with subcuticular stitches.  Sterile  dressings were applied.   The patient returned to the recovery room in excellent condition.  He  tolerated the procedure extremely well.  There were no drains and no  complications.  I was very pleased with the final surgical outcome.           ______________________________  Lenard Galloway. Chaney Malling, M.D.     RAM/MEDQ  D:  02/15/2005  T:  02/15/2005  Job:  [id number]

## 2010-06-09 NOTE — Op Note (Signed)
Katie Neal, Katie Neal                      ACCOUNT NO.:  [id number]   MEDICAL RECORD NO.:  [id number]                   PATIENT TYPE:  AMB   LOCATION:  DSC                                  FACILITY:  [facility]   PHYSICIAN:  Leonides Grills, M.D.                  DATE OF BIRTH:  1970/07/18   DATE OF PROCEDURE:  05/26/2002  DATE OF DISCHARGE:                                 OPERATIVE REPORT   PREOPERATIVE DIAGNOSES:  1. Left peroneus longus tendinitis.  2. Left peroneus brevis tendinitis.  3. Left calcaneal spur.   POSTOPERATIVE DIAGNOSES:  1. Left peroneus longus tendinitis.  2. Left peroneus brevis tendinitis.  3. Left calcaneal spur.   OPERATION:  1. Left peroneus longus tenosynovectomy.  2. Left peroneus  brevis tenosynovectomy.  3. Left excision of peroneal tubercle calcaneous.  4. Left sural nerve neurolysis.   ANESTHESIA:  General endotracheal anesthesia.   SURGEON:  Leonides Grills, M.D.   ASSISTANT:  Lianne Cure, P.A.-C.   ESTIMATED BLOOD LOSS:  Minimal.   TOURNIQUET TIME:  Approximately 45 minutes.   COMPLICATIONS:  None.   DISPOSITION:  Stable to the recovery room.   INDICATIONS FOR PROCEDURE:  This is a 40 year old female who has had  persistent pain over the lateral aspect of her left hindfoot over the  peroneal tubercle. She  has tried several methods of conservative management  without any relief. She was consented for  the above procedure. All  risks  which include infection, neurovascular injury, persistent pain, worsening  pain, neuroma, stiffness were all explained. Questions were encouraged and  answered.   DESCRIPTION OF PROCEDURE:  The patient was brought to the operating room and  placed initially in the supine position. After adequate general endotracheal  tube anesthesia was administered as well as Ancef 1 gm IV piggyback, the  patient was placed in the lateral decubitus position with the left  side up  on a bean bag. An axillary roll was  placed. All bony prominences were well  padded. The left lower extremity was then prepped and draped in the sterile  manner over a proximally placed thigh tourniquet. The limb was gravity  exsanguinated and the tourniquet was elevated to 290 mmHg.   A large dermal incision in the line of the peroneal tendons was then  made  over the peroneal tubercle. Dissection was carried through the skin. The  sural nerve was identified and neurolysed over this area and protected for  the main portion of the procedure.   The retinaculum over the peroneus brevis had been opened. There was a large  amount of synovitis in this area. The synovectomy was then performed. The  retinaculum over the peroneus  longus was then opened and again a formal  tenosynovectomy was performed through this tendon as well.   The tendons were inspected and both were without any evidence of tear. There  was a very large  prominent peroneal tubercle. The soft tissues were sharply  elevated over the peroneal tubercle with a curved 1/4 inch osteotome. The  tubercle was then removed. With a rongeur this was then rounded off.  It was  nice and smooth at the end of the procedure.   The wound was copiously irrigated with normal saline. The subcu was closed  with 3-0 Vicryl. The skin was closed with 4-0 nylon. A sterile dressing was  applied. A Cam Walker boot was applied. The patient was stable to the  recovery room.                                               Leonides Grills, M.D.    PB/MEDQ  D:  05/26/2002  T:  05/26/2002  Job:  [id number]

## 2010-11-02 LAB — SEDIMENTATION RATE: Sed Rate: 14

## 2010-11-02 LAB — POCT CARDIAC MARKERS
CKMB, poc: <1 — ABNORMAL LOW
Myoglobin, poc: 41.8
Troponin i, poc: <0.05

## 2010-11-02 LAB — CK: Total CK: 58

## 2011-01-09 ENCOUNTER — Ambulatory Visit (INDEPENDENT_AMBULATORY_CARE_PROVIDER_SITE_OTHER): Payer: Self-pay | Admitting: Surgery

## 2011-01-11 ENCOUNTER — Encounter (HOSPITAL_COMMUNITY): Payer: Self-pay | Admitting: Pharmacist

## 2011-01-24 ENCOUNTER — Encounter (HOSPITAL_COMMUNITY)
Admission: RE | Admit: 2011-01-24 | Discharge: 2011-01-24 | Disposition: A | Discharge status: Home / Self Care | Payer: Managed Care, Other (non HMO) | Source: Ambulatory Visit | Attending: Obstetrics and Gynecology | Admitting: Obstetrics and Gynecology

## 2011-01-24 ENCOUNTER — Encounter (HOSPITAL_COMMUNITY): Payer: Self-pay

## 2011-01-24 LAB — CBC
HCT: 42.4 % (ref 36.0–46.0)
MCH: 32.7 pg (ref 26.0–34.0)
MCHC: 34.2 g/dL (ref 30.0–36.0)
RDW: 13 % (ref 11.5–15.5)

## 2011-01-24 LAB — SURGICAL PCR SCREEN: MRSA, PCR: NEGATIVE

## 2011-01-24 NOTE — Patient Instructions (Addendum)
20 Katie Neal  01/24/2011   Your procedure is scheduled on:  02/02/11  Enter through the Main Entrance of [facility] at 1130 AM.  Pick up the phone at the desk and dial [phone].   Call this number if you have problems the morning of surgery: [phone]   Remember:   Do not eat food:After Midnight.  Do not drink clear liquids: After Midnight.  Take these medicines the morning of surgery with A SIP OF WATER: Effexor   Do not wear jewelry, make-up or nail polish.  Do not wear lotions, powders, or perfumes. You may wear deodorant.  Do not shave 48 hours prior to surgery.  Do not bring valuables to the hospital.  Contacts, dentures or bridgework may not be worn into surgery.  Leave suitcase in the car. After surgery it may be brought to your room.  For patients admitted to the hospital, checkout time is 11:00 AM the day of discharge.   Patients discharged the day of surgery will not be allowed to drive home.  Name and phone number of your driver: NA  Special Instructions: CHG Shower Use Special Wash: 1/2 bottle night before surgery and 1/2 bottle morning of surgery.   Please read over the following fact sheets that you were given: MRSA Information

## 2011-01-26 ENCOUNTER — Ambulatory Visit (INDEPENDENT_AMBULATORY_CARE_PROVIDER_SITE_OTHER): Payer: Self-pay | Admitting: Surgery

## 2011-01-29 ENCOUNTER — Other Ambulatory Visit: Payer: Self-pay | Admitting: Obstetrics and Gynecology

## 2011-01-29 ENCOUNTER — Encounter (INDEPENDENT_AMBULATORY_CARE_PROVIDER_SITE_OTHER): Payer: Self-pay | Admitting: Surgery

## 2011-02-02 ENCOUNTER — Ambulatory Visit (HOSPITAL_COMMUNITY)
Admission: RE | Admit: 2011-02-02 | Discharge: 2011-02-02 | Disposition: A | Discharge status: Home / Self Care | Payer: Managed Care, Other (non HMO) | Source: Ambulatory Visit | Attending: Obstetrics and Gynecology | Admitting: Obstetrics and Gynecology

## 2011-02-02 ENCOUNTER — Encounter (HOSPITAL_COMMUNITY): Payer: Self-pay | Admitting: Anesthesiology

## 2011-02-02 ENCOUNTER — Other Ambulatory Visit: Payer: Self-pay | Admitting: Obstetrics and Gynecology

## 2011-02-02 ENCOUNTER — Encounter (HOSPITAL_COMMUNITY): Payer: Self-pay | Admitting: *Deleted

## 2011-02-02 ENCOUNTER — Ambulatory Visit (HOSPITAL_COMMUNITY): Payer: Managed Care, Other (non HMO) | Admitting: Anesthesiology

## 2011-02-02 ENCOUNTER — Encounter (HOSPITAL_COMMUNITY)
Admission: RE | Disposition: A | Discharge status: Home / Self Care | Payer: Self-pay | Source: Ambulatory Visit | Attending: Obstetrics and Gynecology

## 2011-02-02 DIAGNOSIS — N831 Corpus luteum cyst of ovary, unspecified side: Secondary | ICD-10-CM | POA: Insufficient documentation

## 2011-02-02 DIAGNOSIS — N9489 Other specified conditions associated with female genital organs and menstrual cycle: Secondary | ICD-10-CM | POA: Insufficient documentation

## 2011-02-02 DIAGNOSIS — N803 Endometriosis of pelvic peritoneum, unspecified: Secondary | ICD-10-CM | POA: Insufficient documentation

## 2011-02-02 DIAGNOSIS — N949 Unspecified condition associated with female genital organs and menstrual cycle: Secondary | ICD-10-CM | POA: Insufficient documentation

## 2011-02-02 DIAGNOSIS — N8300 Follicular cyst of ovary, unspecified side: Secondary | ICD-10-CM | POA: Insufficient documentation

## 2011-02-02 SURGERY — ROBOTIC ASSISTED BILATERAL SALPINGO OOPHORECTOMY
Anesthesia: General | Site: Abdomen | Wound class: Clean Contaminated

## 2011-02-02 MED ORDER — CEFAZOLIN SODIUM 1-5 GM-% IV SOLN
INTRAVENOUS | Status: AC
  Administered 2011-02-02: 1 g via INTRAVENOUS
  Filled 2011-02-02: qty 50

## 2011-02-02 MED ORDER — LIDOCAINE HCL (CARDIAC) 20 MG/ML IV SOLN
INTRAVENOUS | Status: AC
  Filled 2011-02-02: qty 5

## 2011-02-02 MED ORDER — TRAMADOL HCL 50 MG PO TABS: 50.0000 mg | ORAL_TABLET | Freq: Four times a day (QID) | ORAL | Status: AC | PRN

## 2011-02-02 MED ORDER — FENTANYL CITRATE 0.05 MG/ML IJ SOLN
INTRAMUSCULAR | Status: DC | PRN
  Administered 2011-02-02: 100 ug via INTRAVENOUS
  Administered 2011-02-02: 50 ug via INTRAVENOUS
  Administered 2011-02-02: 100 ug via INTRAVENOUS

## 2011-02-02 MED ORDER — MIDAZOLAM HCL 5 MG/5ML IJ SOLN
INTRAMUSCULAR | Status: DC | PRN
  Administered 2011-02-02: 2 mg via INTRAVENOUS

## 2011-02-02 MED ORDER — NEOSTIGMINE METHYLSULFATE 1 MG/ML IJ SOLN
INTRAMUSCULAR | Status: AC
  Filled 2011-02-02: qty 10

## 2011-02-02 MED ORDER — CEFAZOLIN SODIUM 1-5 GM-% IV SOLN
INTRAVENOUS | Status: AC
  Filled 2011-02-02: qty 50

## 2011-02-02 MED ORDER — MIDAZOLAM HCL 2 MG/2ML IJ SOLN
INTRAMUSCULAR | Status: AC
  Filled 2011-02-02: qty 2

## 2011-02-02 MED ORDER — ROCURONIUM BROMIDE 100 MG/10ML IV SOLN
INTRAVENOUS | Status: DC | PRN
  Administered 2011-02-02: 10 mg via INTRAVENOUS
  Administered 2011-02-02: 40 mg via INTRAVENOUS

## 2011-02-02 MED ORDER — ONDANSETRON HCL 4 MG/2ML IJ SOLN
INTRAMUSCULAR | Status: DC | PRN
  Administered 2011-02-02: 4 mg via INTRAVENOUS

## 2011-02-02 MED ORDER — GLYCOPYRROLATE 0.2 MG/ML IJ SOLN
INTRAMUSCULAR | Status: AC
  Filled 2011-02-02: qty 3

## 2011-02-02 MED ORDER — LIDOCAINE HCL (CARDIAC) 20 MG/ML IV SOLN
INTRAVENOUS | Status: DC | PRN
  Administered 2011-02-02: 80 mg via INTRAVENOUS

## 2011-02-02 MED ORDER — PHENYLEPHRINE HCL 10 MG/ML IJ SOLN
INTRAMUSCULAR | Status: DC | PRN
  Administered 2011-02-02: 80 ug via INTRAVENOUS

## 2011-02-02 MED ORDER — KETOROLAC TROMETHAMINE 60 MG/2ML IM SOLN
INTRAMUSCULAR | Status: AC
  Filled 2011-02-02: qty 2

## 2011-02-02 MED ORDER — KETOROLAC TROMETHAMINE 30 MG/ML IJ SOLN
INTRAMUSCULAR | Status: DC | PRN
  Administered 2011-02-02: 30 mg via INTRAVENOUS

## 2011-02-02 MED ORDER — FENTANYL CITRATE 0.05 MG/ML IJ SOLN: 25.0000 ug | INTRAMUSCULAR | Status: DC | PRN

## 2011-02-02 MED ORDER — BUPIVACAINE HCL (PF) 0.25 % IJ SOLN
INTRAMUSCULAR | Status: DC | PRN
  Administered 2011-02-02: 5 mL

## 2011-02-02 MED ORDER — DEXAMETHASONE SODIUM PHOSPHATE 10 MG/ML IJ SOLN
INTRAMUSCULAR | Status: AC
  Filled 2011-02-02: qty 1

## 2011-02-02 MED ORDER — PROPOFOL 10 MG/ML IV EMUL
INTRAVENOUS | Status: AC
  Filled 2011-02-02: qty 20

## 2011-02-02 MED ORDER — ROCURONIUM BROMIDE 50 MG/5ML IV SOLN
INTRAVENOUS | Status: AC
  Filled 2011-02-02: qty 2

## 2011-02-02 MED ORDER — ACETAMINOPHEN 10 MG/ML IV SOLN
1000.0000 mg | Freq: Once | INTRAVENOUS | Status: DC
  Filled 2011-02-02: qty 100

## 2011-02-02 MED ORDER — KETOROLAC TROMETHAMINE 60 MG/2ML IM SOLN
INTRAMUSCULAR | Status: DC | PRN
  Administered 2011-02-02: 30 mg via INTRAMUSCULAR

## 2011-02-02 MED ORDER — SODIUM CHLORIDE 0.9 % IJ SOLN
INTRAMUSCULAR | Status: DC | PRN
  Administered 2011-02-02: 10 mL

## 2011-02-02 MED ORDER — KETOROLAC TROMETHAMINE 30 MG/ML IJ SOLN: 15.0000 mg | Freq: Once | INTRAMUSCULAR | Status: DC | PRN

## 2011-02-02 MED ORDER — PROPOFOL 10 MG/ML IV EMUL
INTRAVENOUS | Status: DC | PRN
  Administered 2011-02-02: 200 mg via INTRAVENOUS

## 2011-02-02 MED ORDER — ARTIFICIAL TEARS OP OINT
TOPICAL_OINTMENT | OPHTHALMIC | Status: DC | PRN
  Administered 2011-02-02: 1 via OPHTHALMIC

## 2011-02-02 MED ORDER — MEPERIDINE HCL 25 MG/ML IJ SOLN: 6.2500 mg | INTRAMUSCULAR | Status: DC | PRN

## 2011-02-02 MED ORDER — NEOSTIGMINE METHYLSULFATE 1 MG/ML IJ SOLN
INTRAMUSCULAR | Status: DC | PRN
  Administered 2011-02-02: 4 mg via INTRAVENOUS

## 2011-02-02 MED ORDER — ONDANSETRON HCL 4 MG/2ML IJ SOLN
INTRAMUSCULAR | Status: AC
  Filled 2011-02-02: qty 2

## 2011-02-02 MED ORDER — METOCLOPRAMIDE HCL 5 MG/ML IJ SOLN: 10.0000 mg | Freq: Once | INTRAMUSCULAR | Status: DC | PRN

## 2011-02-02 MED ORDER — ONDANSETRON HCL 4 MG/2ML IJ SOLN: 4.0000 mg | Freq: Once | INTRAMUSCULAR | Status: DC | PRN

## 2011-02-02 MED ORDER — HYDROMORPHONE HCL PF 1 MG/ML IJ SOLN
INTRAMUSCULAR | Status: AC
  Filled 2011-02-02: qty 1

## 2011-02-02 MED ORDER — OXYCODONE-ACETAMINOPHEN 5-325 MG PO TABS: 1.0000 | ORAL_TABLET | ORAL | Status: AC | PRN

## 2011-02-02 MED ORDER — LACTATED RINGERS IV SOLN
INTRAVENOUS | Status: DC
  Administered 2011-02-02: 125 mL/h via INTRAVENOUS

## 2011-02-02 MED ORDER — FENTANYL CITRATE 0.05 MG/ML IJ SOLN
INTRAMUSCULAR | Status: AC
  Filled 2011-02-02: qty 5

## 2011-02-02 MED ORDER — PHENYLEPHRINE 40 MCG/ML (10ML) SYRINGE FOR IV PUSH (FOR BLOOD PRESSURE SUPPORT)
PREFILLED_SYRINGE | INTRAVENOUS | Status: AC
  Filled 2011-02-02: qty 5

## 2011-02-02 MED ORDER — LACTATED RINGERS IV SOLN
INTRAVENOUS | Status: DC | PRN
  Administered 2011-02-02 (×2): via INTRAVENOUS

## 2011-02-02 MED ORDER — DEXAMETHASONE SODIUM PHOSPHATE 10 MG/ML IJ SOLN
INTRAMUSCULAR | Status: DC | PRN
  Administered 2011-02-02: 10 mg via INTRAVENOUS

## 2011-02-02 MED ORDER — SCOPOLAMINE 1 MG/3DAYS TD PT72
1.0000 | MEDICATED_PATCH | Freq: Once | TRANSDERMAL | Status: DC
  Administered 2011-02-02: 1.5 mg via TRANSDERMAL

## 2011-02-02 MED ORDER — HYDROMORPHONE HCL PF 1 MG/ML IJ SOLN
INTRAMUSCULAR | Status: DC | PRN
  Administered 2011-02-02: 1 mg via INTRAVENOUS

## 2011-02-02 MED ORDER — ACETAMINOPHEN 10 MG/ML IV SOLN
INTRAVENOUS | Status: DC | PRN
  Administered 2011-02-02: 1000 mg via INTRAVENOUS

## 2011-02-02 MED ORDER — GLYCOPYRROLATE 0.2 MG/ML IJ SOLN
INTRAMUSCULAR | Status: DC | PRN
  Administered 2011-02-02: .8 mg via INTRAVENOUS
  Administered 2011-02-02: 0.1 mg via INTRAVENOUS

## 2011-02-02 MED ORDER — SCOPOLAMINE 1 MG/3DAYS TD PT72
MEDICATED_PATCH | TRANSDERMAL | Status: AC
  Administered 2011-02-02: 1.5 mg via TRANSDERMAL
  Filled 2011-02-02: qty 1

## 2011-02-02 SURGICAL SUPPLY — 72 items
BAG URINE DRAINAGE (UROLOGICAL SUPPLIES) ×4 IMPLANT
BARRIER ADHS 3X4 INTERCEED (GAUZE/BANDAGES/DRESSINGS) IMPLANT
BLADELESS LONG 8MM (BLADE) IMPLANT
CABLE HIGH FREQUENCY MONO STRZ (ELECTRODE) ×4 IMPLANT
CATH FOLEY 3WAY  5CC 16FR (CATHETERS) ×1
CATH FOLEY 3WAY 5CC 16FR (CATHETERS) ×3 IMPLANT
CHLORAPREP W/TINT 26ML (MISCELLANEOUS) ×4 IMPLANT
CLOTH BEACON ORANGE TIMEOUT ST (SAFETY) ×4 IMPLANT
CONT PATH 16OZ SNAP LID 3702 (MISCELLANEOUS) ×4 IMPLANT
COVER MAYO STAND STRL (DRAPES) ×4 IMPLANT
COVER TABLE BACK 60X90 (DRAPES) ×8 IMPLANT
COVER TIP SHEARS 8 DVNC (MISCELLANEOUS) ×3 IMPLANT
COVER TIP SHEARS 8MM DA VINCI (MISCELLANEOUS) ×1
DECANTER SPIKE VIAL GLASS SM (MISCELLANEOUS) ×4 IMPLANT
DERMABOND ADVANCED (GAUZE/BANDAGES/DRESSINGS) ×4
DERMABOND ADVANCED .7 DNX12 (GAUZE/BANDAGES/DRESSINGS) ×12 IMPLANT
DRAPE HUG U DISPOSABLE (DRAPE) ×4 IMPLANT
DRAPE HYSTEROSCOPY (DRAPE) IMPLANT
DRAPE LG THREE QUARTER DISP (DRAPES) ×8 IMPLANT
DRAPE MONITOR DA VINCI (DRAPE) IMPLANT
DRAPE WARM FLUID 44X44 (DRAPE) ×4 IMPLANT
ELECT REM PT RETURN 9FT ADLT (ELECTROSURGICAL) ×4
ELECTRODE REM PT RTRN 9FT ADLT (ELECTROSURGICAL) ×3 IMPLANT
EVACUATOR SMOKE 8.L (FILTER) ×4 IMPLANT
GAUZE VASELINE 3X9 (GAUZE/BANDAGES/DRESSINGS) IMPLANT
GLOVE BIO SURGEON STRL SZ7.5 (GLOVE) ×12 IMPLANT
GOWN PREVENTION PLUS XLARGE (GOWN DISPOSABLE) ×4 IMPLANT
GOWN STRL REIN XL XLG (GOWN DISPOSABLE) ×24 IMPLANT
GYRUS RUMI II 2.5CM BLUE (DISPOSABLE)
GYRUS RUMI II 3.5CM BLUE (DISPOSABLE)
GYRUS RUMI II 4.0CM BLUE (DISPOSABLE)
KIT ACCESSORY DA VINCI DISP (KITS) ×1
KIT ACCESSORY DVNC DISP (KITS) ×3 IMPLANT
KIT DISP ACCESSORY 4 ARM (KITS) IMPLANT
NEEDLE INSUFFLATION 14GA 120MM (NEEDLE) ×4 IMPLANT
PACK LAVH (CUSTOM PROCEDURE TRAY) ×4 IMPLANT
PAD PREP 24X48 CUFFED NSTRL (MISCELLANEOUS) ×8 IMPLANT
PLUG CATH AND CAP STER (CATHETERS) ×4 IMPLANT
POSITIONER SURGICAL ARM (MISCELLANEOUS) ×8 IMPLANT
POUCH SPECIMEN RETRIEVAL 10MM (ENDOMECHANICALS) ×4 IMPLANT
RUMI II 3.0CM BLUE KOH-EFFICIE (DISPOSABLE) IMPLANT
RUMI II GYRUS 2.5CM BLUE (DISPOSABLE) IMPLANT
RUMI II GYRUS 3.5CM BLUE (DISPOSABLE) IMPLANT
RUMI II GYRUS 4.0CM BLUE (DISPOSABLE) IMPLANT
SET IRRIG TUBING LAPAROSCOPIC (IRRIGATION / IRRIGATOR) ×4 IMPLANT
SOLUTION ELECTROLUBE (MISCELLANEOUS) ×4 IMPLANT
SPONGE LAP 18X18 X RAY DECT (DISPOSABLE) IMPLANT
SUT VIC AB 0 CT1 27 (SUTURE)
SUT VIC AB 0 CT1 27XBRD ANBCTR (SUTURE) IMPLANT
SUT VIC AB 0 CT1 27XBRD ANTBC (SUTURE) IMPLANT
SUT VIC AB 0 CT2 27 (SUTURE) IMPLANT
SUT VICRYL 0 27 CT2 27 ABS (SUTURE) IMPLANT
SUT VICRYL 0 UR6 27IN ABS (SUTURE) ×4 IMPLANT
SUT VICRYL RAPIDE 4/0 PS 2 (SUTURE) ×8 IMPLANT
SYR 50ML LL SCALE MARK (SYRINGE) ×4 IMPLANT
SYRINGE 10CC LL (SYRINGE) ×4 IMPLANT
SYSTEM CONVERTIBLE TROCAR (TROCAR) IMPLANT
TIP UTERINE 5.1X6CM LAV DISP (MISCELLANEOUS) IMPLANT
TIP UTERINE 6.7X10CM GRN DISP (MISCELLANEOUS) IMPLANT
TIP UTERINE 6.7X6CM WHT DISP (MISCELLANEOUS) IMPLANT
TIP UTERINE 6.7X8CM BLUE DISP (MISCELLANEOUS) IMPLANT
TOWEL OR 17X24 6PK STRL BLUE (TOWEL DISPOSABLE) ×12 IMPLANT
TROCAR DISP BLADELESS 8 DVNC (TROCAR) ×3 IMPLANT
TROCAR DISP BLADELESS 8MM (TROCAR) ×1
TROCAR XCEL 12X100 BLDLESS (ENDOMECHANICALS) IMPLANT
TROCAR XCEL NON-BLD 5MMX100MML (ENDOMECHANICALS) ×4 IMPLANT
TROCAR Z-THREAD 12X150 (TROCAR) ×4 IMPLANT
TROCAR Z-THREAD BLADED 12X100M (TROCAR) IMPLANT
TROCAR Z-THREAD FIOS 12X100MM (TROCAR) IMPLANT
TUBING FILTER THERMOFLATOR (ELECTROSURGICAL) ×4 IMPLANT
WARMER LAPAROSCOPE (MISCELLANEOUS) ×4 IMPLANT
WATER STERILE IRR 1000ML POUR (IV SOLUTION) ×12 IMPLANT

## 2011-02-02 NOTE — Transfer of Care (Signed)
Immediate Anesthesia Transfer of Care Note  Patient: Katie Neal  Procedure(s) Performed:  ROBOTIC ASSISTED BILATERAL SALPINGO OOPHERECTOMY  Patient Location: PACU  Anesthesia Type: General  Level of Consciousness: awake, alert  and oriented  Airway & Oxygen Therapy: Patient Spontanous Breathing and Patient connected to face mask oxygen  Post-op Assessment: Report given to PACU RN and Post -op Vital signs reviewed and stable  Post vital signs: Reviewed and stable Filed Vitals:   02/02/11 1500  BP: 117/76  Pulse: 90  Temp:   Resp: 16    Complications: No apparent anesthesia complications

## 2011-02-02 NOTE — Anesthesia Procedure Notes (Signed)
Procedure Name: Intubation Date/Time: 02/02/2011 1:32 PM Performed by: Karleen Dolphin Pre-anesthesia Checklist: Emergency Drugs available, Timeout performed, Suction available, Patient identified and Patient being monitored Patient Re-evaluated:Patient Re-evaluated prior to inductionOxygen Delivery Method: Circle System Utilized Preoxygenation: Pre-oxygenation with 100% oxygen Intubation Type: IV induction and Cricoid Pressure applied Ventilation: Mask ventilation with difficulty and Oral airway inserted - appropriate to patient size Laryngoscope Size: Mac and 3 Grade View: Grade I Tube type: Oral Tube size: 6.0 mm Number of attempts: 2 (succesfully intubated first attempt with 7.0 but too tight - couldn't advance cuff past cords. Mask venilated and reintubaletd with 6.0 ETT. Criciod pressure needed to visualize cords.) Airway Equipment and Method: stylet Placement Confirmation: ETT inserted through vocal cords under direct vision,  breath sounds checked- equal and bilateral and positive ETCO2 Secured at: 20 cm Tube secured with: Tape Dental Injury: Teeth and Oropharynx as per pre-operative assessment  Difficulty Due To: Difficulty was unanticipated

## 2011-02-02 NOTE — Anesthesia Postprocedure Evaluation (Signed)
Anesthesia Post Note  Patient: Katie Neal  Procedure(s) Performed:  ROBOTIC ASSISTED BILATERAL SALPINGO OOPHERECTOMY  Anesthesia type: General  Patient location: PACU  Post pain: Pain level controlled  Post assessment: Post-op Vital signs reviewed  Last Vitals:  Filed Vitals:   02/02/11 1530  BP: 106/67  Pulse: 80  Temp:   Resp: 16    Post vital signs: Reviewed  Level of consciousness: sedated  Complications: No apparent anesthesia complicationsfj

## 2011-02-02 NOTE — H&P (Signed)
Katie Neal, WATKIN NO.:  [id number]  MEDICAL RECORD NO.:  [id number]  LOCATION:  [facility]                         FACILITY:  [facility]  PHYSICIAN:  Lenoard Aden, M.D.DATE OF BIRTH:  [birth year]  DATE OF ADMISSION:  01/01/2011 DATE OF DISCHARGE:                             HISTORY & PHYSICAL   CHIEF COMPLAINT:  Pelvic pain with a history of endometriosis and history of LADH for definitive therapy.  HISTORY OF PRESENT ILLNESS:  She is a 41 year old white female, G3, P2, history of LADH in 2001 who presents now with persistent pelvic pain, ovarian cyst __________ chronic pain medications with a known history of endometriosis.  She has a history of 2 vaginal deliveries, 1 spontaneous abortion.  Surgical history for knee replacement, hip bursa reconstruction, appendectomy,  __________ repair, knee operations, and ACL replacement.  Medications include MS Contin p.r.n., Xanax p.r.n., gabapentin, doxepin, trazodone, Effexor, and Percocet p.r.n.  She is a pack per day smoker for 25 years.  She denies domestic or physical violence.  She has a family history oflymphoma, hypertension, MI  heart disease.  PHYSICAL EXAMINATION:  GENERAL:  She is a well-developed, well- nourished, white female, in no acute distress.  Height 65 inches, weight of 151 pounds. HEENT:  Normal. NECK:  Supple.  Full range of motion. LUNGS:  Clear. HEART:  Regular rhythm. ABDOMEN:  Soft, scaphoid, midline tender in bilateral lower quadrants. PELVIC:  Reveals a well supported vaginal cuff and tenderness in the adnexa on the right. EXTREMITIES:  There are no cords. NEUROLOGIC:  Nonfocal. SKIN:  Intact.  IMPRESSION:  Symptomatic pelvic pain, history of pelvic endometriosis, previous history ofovarian cyst.  PLAN:  To proceed with __________ BSO, possible lysis of adhesions, possible ablation, and excision of endometriosis.  Risks of anesthesia, infection, bleeding, injury to abdominal  organs, and need for repair was discussed, delayed versus immediate complications to include bowel and bladder injury noted, inability to cure all pelvic pain is discussed. The patient acknowledges, wishes to proceed.     Lenoard Aden, M.D.     RJT/MEDQ  D:  02/01/2011  T:  02/01/2011  Job:  [id number]

## 2011-02-02 NOTE — Op Note (Signed)
02/02/2011  2:39 PM  PATIENT:  Katie Neal  41 y.o. female  PRE-OPERATIVE DIAGNOSIS:  Ovarian cyst, Endometriosis Pelvic Adhesions  POST-OPERATIVE DIAGNOSIS:  Ovarian Cyst; Endometriosis Pelvic adhesions  PROCEDURE:  Procedure(s): ROBOTIC ASSISTED BILATERAL SALPINGO OOPHERECTOMY Lysis of Adhesions Ablation of endometriosis  SURGEON:  Surgeon(s): Lenoard Aden, MD Genia Del, MD  ASSISTANTS: Seymour Bars   ANESTHESIA:   local and general  ESTIMATED BLOOD LOSS: minimal  DRAINS: Urinary Catheter (Foley)   LOCAL MEDICATIONS USED:  MARCAINE 10CC  SPECIMEN:  Source of Specimen:  Bilateral ovaries and tubes  DISPOSITION OF SPECIMEN:  PATHOLOGY  COUNTS:  YES  DICTATION #: [id number]  PLAN OF CARE: DC home  PATIENT DISPOSITION:  PACU - hemodynamically stable.

## 2011-02-02 NOTE — Progress Notes (Signed)
Patient ID: Katie Neal, female   DOB: December 05, 1970, 41 y.o.   MRN: [id number] Patient seen and examined. Consent witnessed and signed. No changes noted. Update completed.

## 2011-02-02 NOTE — Anesthesia Preprocedure Evaluation (Signed)
Anesthesia Evaluation  Patient identified by MRN, date of birth, ID band Patient awake    Reviewed: Allergy & Precautions, H&P , NPO status , Patient's Chart, lab work & pertinent test results  Airway Mallampati: II TM Distance: >3 FB Neck ROM: Full    Dental No notable dental hx. (+) Teeth Intact   Pulmonary neg pulmonary ROS,  clear to auscultation  Pulmonary exam normal       Cardiovascular neg cardio ROS Regular Normal    Neuro/Psych  Headaches, Negative Psych ROS   GI/Hepatic negative GI ROS, Neg liver ROS,   Endo/Other  Negative Endocrine ROS  Renal/GU negative Renal ROS  Genitourinary negative   Musculoskeletal negative musculoskeletal ROS (+)   Abdominal   Peds  Hematology negative hematology ROS (+)   Anesthesia Other Findings   Reproductive/Obstetrics negative OB ROS                           Anesthesia Physical Anesthesia Plan  ASA: II  Anesthesia Plan: General   Post-op Pain Management:    Induction: Intravenous  Airway Management Planned: Oral ETT  Additional Equipment:   Intra-op Plan:   Post-operative Plan: Extubation in OR  Informed Consent: I have reviewed the patients History and Physical, chart, labs and discussed the procedure including the risks, benefits and alternatives for the proposed anesthesia with the patient or authorized representative who has indicated his/her understanding and acceptance.   Dental advisory given  Plan Discussed with: CRNA, Anesthesiologist and Surgeon  Anesthesia Plan Comments:         Anesthesia Quick Evaluation  

## 2011-02-04 NOTE — Op Note (Signed)
NAMERAHI, CHANDONNET            ACCOUNT NO.:  [id number]  MEDICAL RECORD NO.:  [id number]  LOCATION:  [facility]                          FACILITY:  [facility]  PHYSICIAN:  Lenoard Aden, M.D.DATE OF BIRTH:  05-26-1970  DATE OF PROCEDURE: DATE OF DISCHARGE:  02/02/2011                              OPERATIVE REPORT   PREOPERATIVE DIAGNOSES:  Pelvic pain, history of endometriosis.  POSTOPERATIVE DIAGNOSES:  Pelvic pain, history of endometriosis, pelvic endometriosis.  PROCEDURES:  Da Vinci-assisted laparoscopic lysis of adhesions, ablation of endometriosis, bilateral salpingo-oophorectomy.  SURGEON:  Lenoard Aden, MD  ASSISTANT:  Genia Del, MD  ANESTHESIA:  General and local.  PROCEDURE:  After being apprised of risks of anesthesia, infection, bleeding, injury to abdominal organs, need for repair, delayed versus immediate complications including bowel and bladder injury, possible need for repair, inability to cure pelvic pain with possible need for further intervention, the patient was brought to the operating room and was administered general anesthetic without complications, prepped and draped in a sterile fashion.  Foley catheter placed.  Bulb syringe was placed per vagina for vaginal distention.  At this time, the attention was turned to the abdominal portion of procedure whereby the feet were placed in Yellofin stirrups and dorsal lithotomy position previously assumed.  The infraumbilical incision was made with a scalpel.  Veress needle was placed to opening pressure of -2, 3 L of CO2 insufflated without difficulty, trocar placed.  Visualization revealed atraumatic trocar entry.  Visualization then showed evidence of omental and bowel adhesions to the left adnexa and to the right adnexa in addition to the vaginal cuff.  Deep Trendelenburg position was established after placement of robotic trocars on the right and left side 8 mm, 5 mm on the left.  Trocars then  docked in a standard fashion, PK forceps and Endo Shears placed.  Adhesions were then sharply lysed from the bowel to the left adnexa using PK and sharp dissection with the Endo Shears.  The left adnexa was then completely freed up exposing the left infundibulopelvic ligament and the ureters coursed, was seen along its entire course.  There was endometriosis in the left adnexa on the left ovary and adjacent peritoneum which was excised sharply.  Then, the infundibulopelvic ligament was cauterized, divided and progressive bites and cauterization done on the left mesosalpinx, freeze the left tube and ovary completely in addition to the excisional peritoneum from the endometriosis.  Good hemostasis was noted.  Ureter was identified peristalsing normally.  The same procedure then was attempted on the right whereby the right ovary was freed from its pelvic adhesions. Infundibulopelvic ligament and ureter were identified.  The infundibulopelvic ligament was cauterized and divided using the Endo Shears and the PK forceps.  The specimen was then completely detached from the pelvic sidewall and placed into the cul-de-sac.  Good hemostasis was noted.  Ablation of endometriosis along the peritoneal sidewalls also done without difficulty.  Of note, the rectovaginal space was scarred and completely obliterated.  At this time, good hemostasis was noted.  Irrigation was accomplished.  The robot was then undocked. The specimens were placed in Endocatch and removed through the 10 mm port without difficulty.  Good hemostasis was  noted.  All instruments were removed under direct visualization.  CO2 was released.  Incision was closed using 0 Vicryl, 4- 0 Vicryl, and Dermabond.  Instruments were removed from the vagina.  The patient tolerated the procedure well, was awakened and transferred to recovery in good condition.     Lenoard Aden, M.D.     RJT/MEDQ  D:  02/02/2011  T:  02/04/2011  Job:   [id number]

## 2011-02-12 IMAGING — CT CT ABDOMEN W/ CM
2 of 4 series · 16 of 46 positions shown, 18 images · IV contrast (CONTRAST)
Comparison: Report of pelvic ultrasound 05/02/2000.

CT ABDOMEN

CLINICAL DATA: Abdominal pain.  Nausea and vomiting.  Elevated
white blood counts.  Diarrhea.  Constipation.

CT ABDOMEN AND PELVIS WITH CONTRAST
TECHNIQUE: Multidetector CT imaging of the abdomen and pelvis was
performed using the standard protocol following bolus
administration of intravenous contrast.
Contrast: 9 ml Omnipaque 300

[Series 2: portal · axial · portal-venous · 0.68mm/px · z∈[+624,+1029]mm · 13 of 89 slices shown, 15 images]
[im 4/89  soft-tissue]
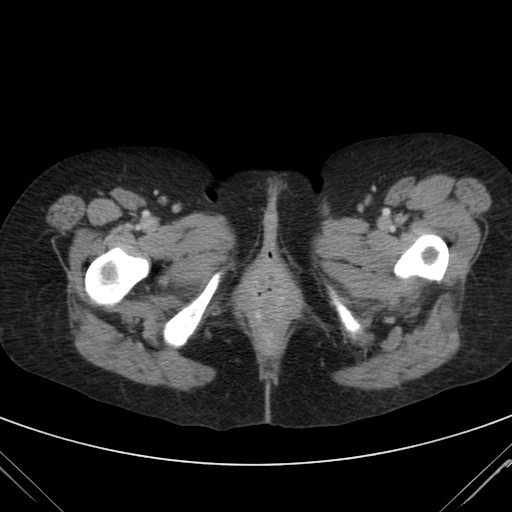
[im 4/89  bone]
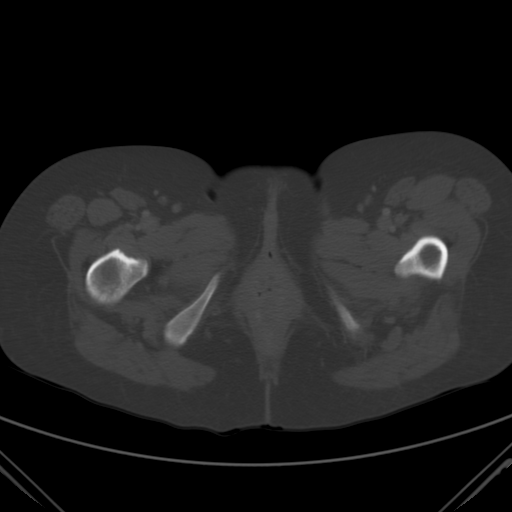
[im 11/89  soft-tissue]
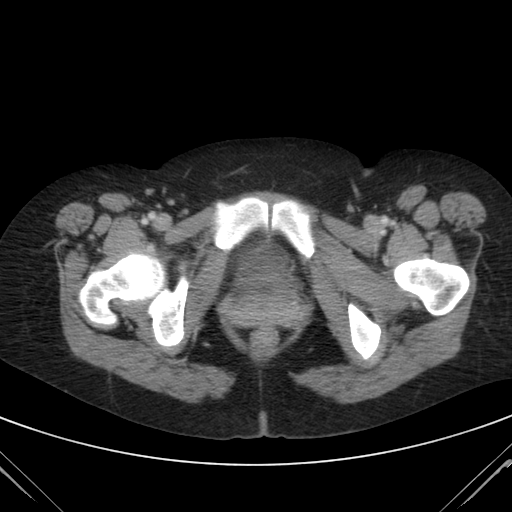
[im 18/89  soft-tissue]
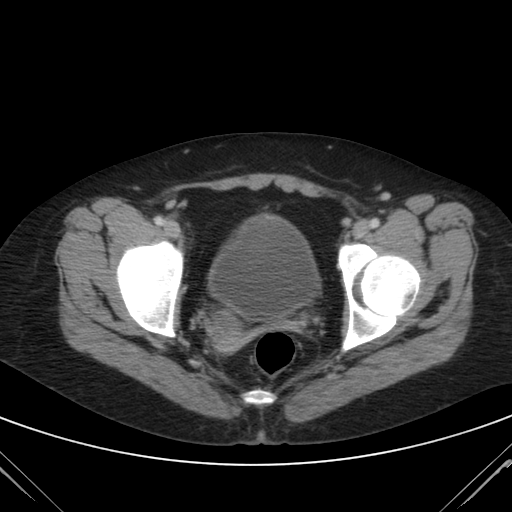
[im 25/89  soft-tissue]
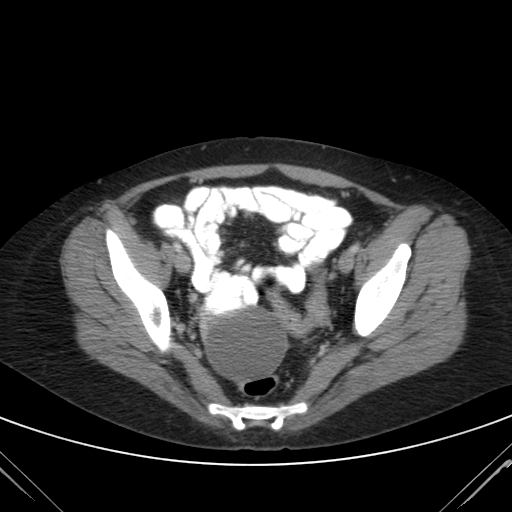
[im 32/89  soft-tissue]
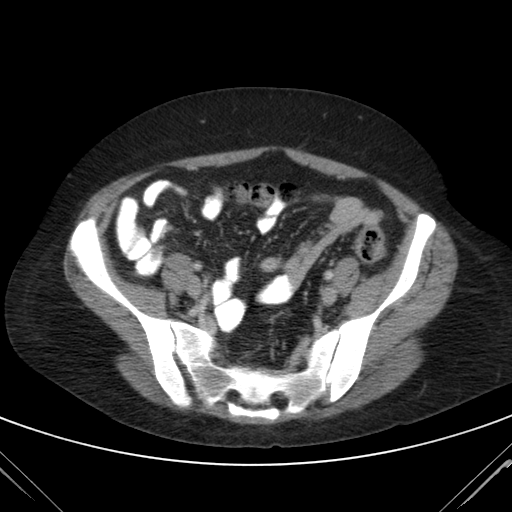
[im 39/89  soft-tissue]
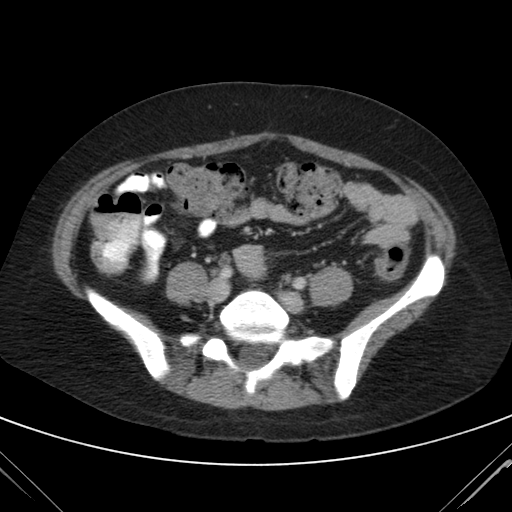
[im 46/89  soft-tissue]
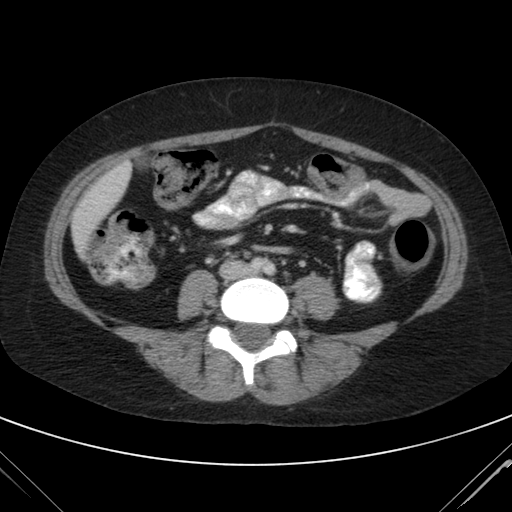
[im 50/89  soft-tissue]
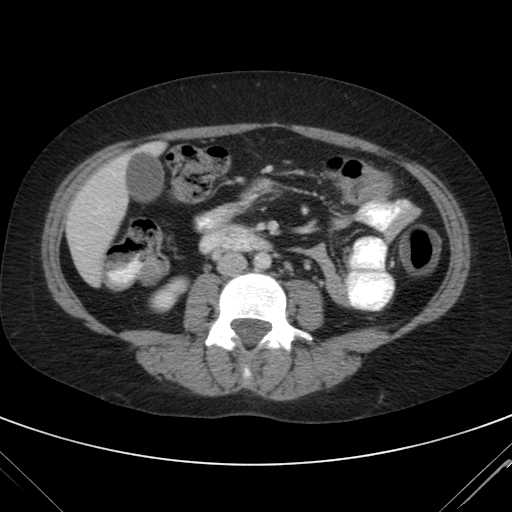
[im 57/89  soft-tissue]
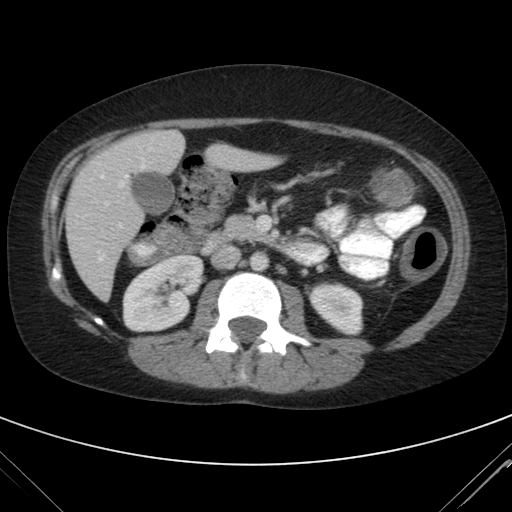
[im 57/89  bone]
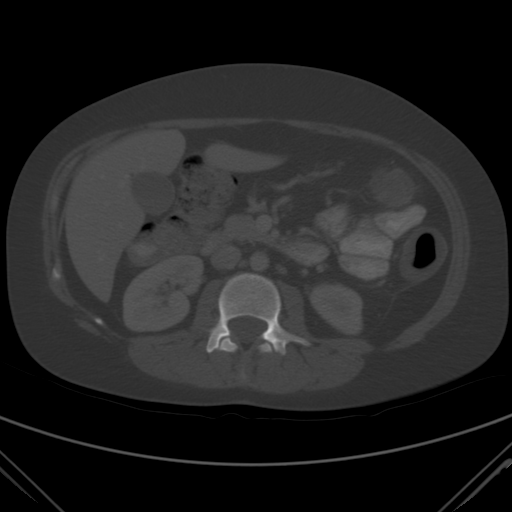
[im 64/89  soft-tissue]
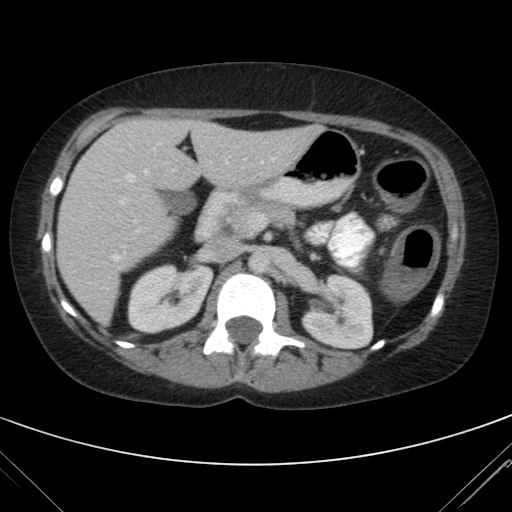
[im 71/89  soft-tissue]
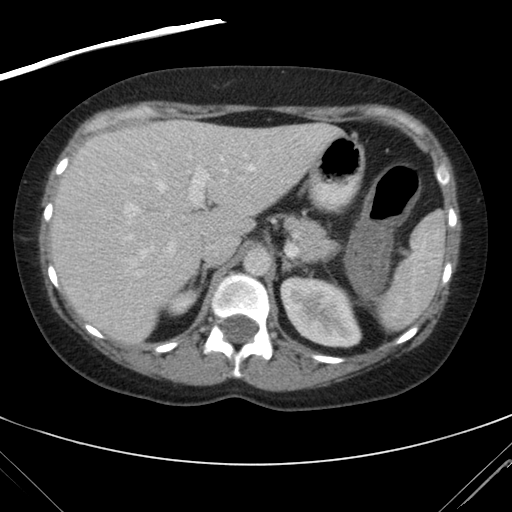
[im 78/89  soft-tissue]
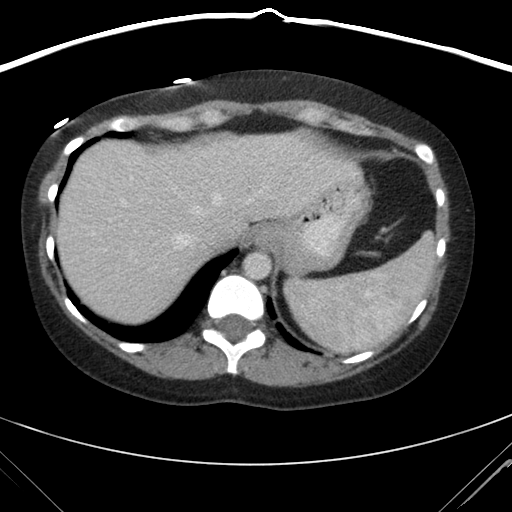
[im 85/89  soft-tissue]
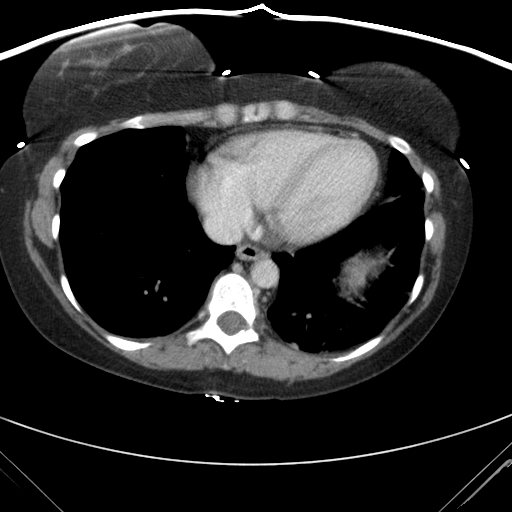

[coronals · coronal · 0.86mm/px · 3 of 69 slices shown]
[im 23/69  soft-tissue]
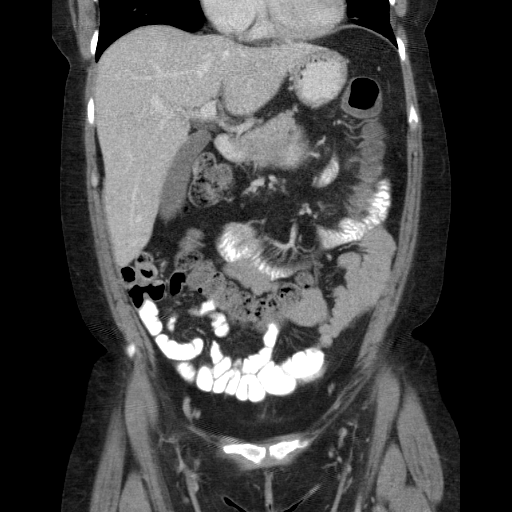
[im 31/69  soft-tissue]
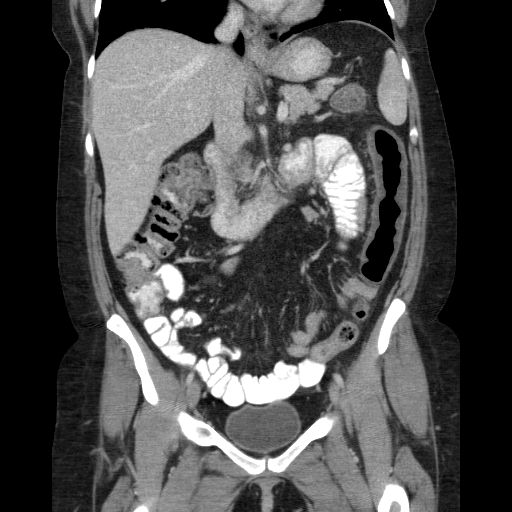
[im 38/69  soft-tissue]
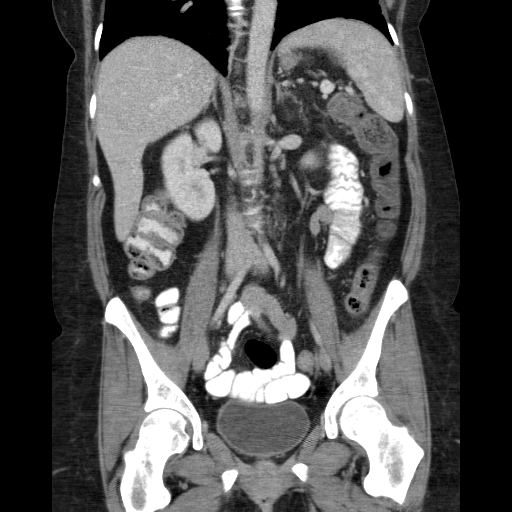

[16 of 46 positions shown; findings below may reference images not displayed]

FINDINGS: There is asymmetric atelectasis at the left lung base.
No other focal nodule, mass, or airspace disease present.  The
heart size is normal.  There is no significant pleural or
pericardial effusion.

The infused appearance of the liver and spleen is normal.  The
stomach, duodenum, and pancreas are also within normal limits.  The
common bile duct and gallbladder are unremarkable.  The adrenal
glands and kidneys are normal bilaterally.  Oral contrast can be
tracked into the ascending colon.  The small bowel is unremarkable.
There is no significant abdominal adenopathy or free fluid.

Bone windows are within normal limits.
IMPRESSION: 1.  No acute abnormality of the abdomen.

CT PELVIS
FINDINGS: The rectosigmoid colon is relatively collapsed.  There
is wall thickening and lack of significant haustral markings within
the proximal descending colon and at the splenic flexure.  There is
thickening and some pericolonic inflammation within the distal
transverse colon.  The more proximal colon is within normal limits.

The appendix is surgically absent.  The patient is status post
hysterectomy.  The left ovary is within normal limits.  There is a
5.7 x 5.3 x 4.9 cm dominant cyst within the right ovary.  A smaller
second to cystic lesion is also present within the right ovary
versus a septation from the larger lesion.  There is no significant
free fluid or pelvic adenopathy.  The urinary bladder is within
normal limits.  Bone windows are unremarkable.
IMPRESSION: 1.  Focal wall thickening and pericolonic inflammation within the
distal transverse colon with associated wall thickening and
dilation distal to this segment within the proximal descending
colon.  This is compatible with a nonspecific colitis.  This may be
infectious colitis.  Inflammatory colitis such as Crohn's disease
is also considered.
2.  Enlarged cystic lesion within the right ovary.  This appears to
have benign characteristics.  A follow-up ultrasound at 6 or 10
weeks would be useful to evaluate for resolution of this lesion.

These findings were discussed with Arsianti Rochaeni, PA for Dr. Quirijn at

## 2011-03-13 ENCOUNTER — Other Ambulatory Visit: Payer: Self-pay

## 2011-03-13 MED ORDER — ALPRAZOLAM 1 MG PO TABS: 2.0000 mg | ORAL_TABLET | Freq: Every evening | ORAL | Status: DC | PRN

## 2011-03-13 NOTE — Telephone Encounter (Signed)
Called in RX 

## 2011-04-11 ENCOUNTER — Other Ambulatory Visit: Payer: Self-pay | Admitting: Family Medicine

## 2011-04-11 MED ORDER — ALPRAZOLAM 1 MG PO TABS: 2.0000 mg | ORAL_TABLET | Freq: Every evening | ORAL | Status: DC | PRN

## 2011-04-19 ENCOUNTER — Telehealth: Payer: Self-pay

## 2011-04-19 NOTE — Telephone Encounter (Signed)
PT WOULD LIKE TO KNOW SOMETHING ELSE SHE CAN TAKE ALONG WITH THE DOXEPIN FOR HER ALLERGYS. PLEASE CALL [phone]

## 2011-04-20 NOTE — Telephone Encounter (Signed)
LMOM to CB. 

## 2011-04-20 NOTE — Telephone Encounter (Signed)
Zyrtec or can add Flonase.  Katie Neal

## 2011-04-20 NOTE — Telephone Encounter (Signed)
Please advise your recommendations.  

## 2011-04-21 NOTE — Telephone Encounter (Signed)
Spoke with pt advised Flonase or Zyrtec. Pt states she has tried these before no relief. Pt understood

## 2011-05-13 ENCOUNTER — Other Ambulatory Visit: Payer: Self-pay | Admitting: Physician Assistant

## 2011-05-13 ENCOUNTER — Other Ambulatory Visit: Payer: Self-pay

## 2011-05-15 ENCOUNTER — Other Ambulatory Visit: Payer: Self-pay

## 2011-05-15 MED ORDER — ALPRAZOLAM 1 MG PO TABS: 1.0000 mg | ORAL_TABLET | Freq: Every evening | ORAL | Status: DC | PRN

## 2011-06-14 ENCOUNTER — Ambulatory Visit (INDEPENDENT_AMBULATORY_CARE_PROVIDER_SITE_OTHER): Payer: Managed Care, Other (non HMO) | Admitting: Family Medicine

## 2011-06-14 VITALS — BP 124/86 | HR 96 | Temp 98.0°F | Resp 16 | Ht 65.18 in | Wt 138.8 lb

## 2011-06-14 DIAGNOSIS — F411 Generalized anxiety disorder: Secondary | ICD-10-CM

## 2011-06-14 DIAGNOSIS — M25539 Pain in unspecified wrist: Secondary | ICD-10-CM

## 2011-06-14 DIAGNOSIS — G4700 Insomnia, unspecified: Secondary | ICD-10-CM

## 2011-06-14 DIAGNOSIS — G43901 Migraine, unspecified, not intractable, with status migrainosus: Secondary | ICD-10-CM

## 2011-06-14 DIAGNOSIS — F419 Anxiety disorder, unspecified: Secondary | ICD-10-CM

## 2011-06-14 MED ORDER — DOXEPIN HCL 10 MG PO CAPS: 10.0000 mg | ORAL_CAPSULE | Freq: Every day | ORAL | Status: DC

## 2011-06-14 MED ORDER — TRAZODONE HCL 100 MG PO TABS: 100.0000 mg | ORAL_TABLET | Freq: Every day | ORAL | Status: DC

## 2011-06-14 MED ORDER — ALPRAZOLAM 1 MG PO TABS: 2.0000 mg | ORAL_TABLET | Freq: Two times a day (BID) | ORAL | Status: DC

## 2011-06-14 MED ORDER — PREDNISONE 20 MG PO TABS: ORAL_TABLET | ORAL | Status: AC

## 2011-06-14 MED ORDER — VENLAFAXINE HCL ER 75 MG PO CP24: 225.0000 mg | ORAL_CAPSULE | Freq: Every day | ORAL | Status: DC

## 2011-06-14 MED ORDER — SUMATRIPTAN SUCCINATE 100 MG PO TABS: 100.0000 mg | ORAL_TABLET | Freq: Once | ORAL | Status: DC | PRN

## 2011-06-14 NOTE — Progress Notes (Signed)
  Subjective:    Patient ID: Katie Neal, female    DOB: [birth year], 41 y.o.   MRN: [id number]  HPI 41 yo female here with wrist pain.  Also needs Rx refills 1) Wrist pain - For 1 month pain on dorsal side.  Works on [employer] 10 hours a day.  Right handed.  Is right wrist that hurts.  Hurts to drive.  TAking lots of ibuprofen, doesn't help much.  Tried small compression brace, ice when really bad.  Starting to hurt now when even putting pressure on it, like resting it on the desk.  Even on computer at home so hasn't had any prolonged rest from typing.    2) RX refills - Needs doxepin, trazadone, effexor, xanax, imitrex.  Gets 30 day supplies.  Doing well on these meds.  No complaints.     Review of Systems Negative except as per HPI     Objective:   Physical Exam  Constitutional: She appears well-developed and well-nourished.  Cardiovascular: Normal rate, regular rhythm, normal heart sounds and intact distal pulses.   No murmur heard. Pulmonary/Chest: Effort normal and breath sounds normal.  Musculoskeletal:       TTP over digital extensor tendons at wrist.  FROM.  Good grip.  Full strength.  Pian with resisted extension of wrist.   Neurological: She is alert.  Skin: Skin is warm and dry.          Assessment & Plan:  Wrist pain - try pred taper Anxiety, migraines, insomnia - meds refilled.

## 2011-06-15 ENCOUNTER — Telehealth: Payer: Self-pay

## 2011-06-15 MED ORDER — TRAZODONE HCL 100 MG PO TABS: ORAL_TABLET | ORAL | Status: DC

## 2011-06-15 NOTE — Telephone Encounter (Signed)
Done and sent in 

## 2011-06-15 NOTE — Telephone Encounter (Signed)
Patient states the traZODone (DESYREL) 100 MG tablet  Take 1 tablet (100 mg total) by mouth at bedtime., Starting 06/14/2011, Until Discontinued, Normal Should be TWO at bedtime.  Would like the change called into the pharmacy

## 2011-06-15 NOTE — Telephone Encounter (Signed)
Please pull paper chart.  

## 2011-06-15 NOTE — Telephone Encounter (Signed)
Chart pulled to PA 

## 2011-06-16 NOTE — Telephone Encounter (Signed)
LMOM rX sent in

## 2011-07-29 ENCOUNTER — Emergency Department: Payer: Self-pay | Admitting: *Deleted

## 2011-08-02 DIAGNOSIS — S83419A Sprain of medial collateral ligament of unspecified knee, initial encounter: Secondary | ICD-10-CM | POA: Insufficient documentation

## 2011-09-20 DIAGNOSIS — Z9660 Presence of unspecified orthopedic joint implant: Secondary | ICD-10-CM | POA: Insufficient documentation

## 2011-12-07 ENCOUNTER — Ambulatory Visit: Payer: Managed Care, Other (non HMO) | Admitting: Family Medicine

## 2011-12-14 ENCOUNTER — Other Ambulatory Visit: Payer: Self-pay | Admitting: Physician Assistant

## 2011-12-14 ENCOUNTER — Other Ambulatory Visit: Payer: Self-pay | Admitting: Family Medicine

## 2011-12-14 NOTE — Telephone Encounter (Signed)
Please pull paper chart.  

## 2011-12-17 ENCOUNTER — Other Ambulatory Visit: Payer: Self-pay

## 2011-12-17 NOTE — Telephone Encounter (Signed)
Chart pulled to PA pool at nurses station [phone]

## 2011-12-17 NOTE — Telephone Encounter (Signed)
Needs office visit.

## 2012-01-04 ENCOUNTER — Other Ambulatory Visit: Payer: Self-pay | Admitting: Family Medicine

## 2012-01-04 NOTE — Telephone Encounter (Signed)
Needs office visit.

## 2012-08-27 DIAGNOSIS — M179 Osteoarthritis of knee, unspecified: Secondary | ICD-10-CM | POA: Insufficient documentation

## 2013-02-10 DIAGNOSIS — M545 Low back pain, unspecified: Secondary | ICD-10-CM | POA: Insufficient documentation

## 2013-02-16 DIAGNOSIS — S92309A Fracture of unspecified metatarsal bone(s), unspecified foot, initial encounter for closed fracture: Secondary | ICD-10-CM | POA: Insufficient documentation

## 2013-04-08 DIAGNOSIS — F32A Depression, unspecified: Secondary | ICD-10-CM | POA: Diagnosis present

## 2013-04-08 DIAGNOSIS — F331 Major depressive disorder, recurrent, moderate: Secondary | ICD-10-CM | POA: Insufficient documentation

## 2013-05-07 DIAGNOSIS — M792 Neuralgia and neuritis, unspecified: Secondary | ICD-10-CM | POA: Insufficient documentation

## 2013-05-27 DIAGNOSIS — Z79899 Other long term (current) drug therapy: Secondary | ICD-10-CM | POA: Insufficient documentation

## 2013-09-18 DIAGNOSIS — G4700 Insomnia, unspecified: Secondary | ICD-10-CM | POA: Insufficient documentation

## 2013-10-28 DIAGNOSIS — M79641 Pain in right hand: Secondary | ICD-10-CM | POA: Insufficient documentation

## 2013-12-07 DIAGNOSIS — M25539 Pain in unspecified wrist: Secondary | ICD-10-CM | POA: Insufficient documentation

## 2014-03-02 DIAGNOSIS — G90A Postural orthostatic tachycardia syndrome (POTS): Secondary | ICD-10-CM | POA: Insufficient documentation

## 2014-03-02 DIAGNOSIS — R000 Tachycardia, unspecified: Secondary | ICD-10-CM | POA: Insufficient documentation

## 2014-03-19 DIAGNOSIS — J189 Pneumonia, unspecified organism: Secondary | ICD-10-CM | POA: Diagnosis present

## 2014-03-20 DIAGNOSIS — R0989 Other specified symptoms and signs involving the circulatory and respiratory systems: Secondary | ICD-10-CM | POA: Insufficient documentation

## 2014-03-20 DIAGNOSIS — R1013 Epigastric pain: Secondary | ICD-10-CM | POA: Insufficient documentation

## 2014-03-20 DIAGNOSIS — F458 Other somatoform disorders: Secondary | ICD-10-CM | POA: Insufficient documentation

## 2014-03-22 DIAGNOSIS — R0902 Hypoxemia: Secondary | ICD-10-CM | POA: Insufficient documentation

## 2014-04-16 DIAGNOSIS — R0902 Hypoxemia: Secondary | ICD-10-CM | POA: Diagnosis present

## 2014-04-16 DIAGNOSIS — K449 Diaphragmatic hernia without obstruction or gangrene: Secondary | ICD-10-CM | POA: Insufficient documentation

## 2014-04-16 DIAGNOSIS — Z87891 Personal history of nicotine dependence: Secondary | ICD-10-CM | POA: Insufficient documentation

## 2014-04-16 DIAGNOSIS — M359 Systemic involvement of connective tissue, unspecified: Secondary | ICD-10-CM | POA: Insufficient documentation

## 2014-04-25 DIAGNOSIS — R0602 Shortness of breath: Secondary | ICD-10-CM | POA: Insufficient documentation

## 2014-05-11 DIAGNOSIS — D649 Anemia, unspecified: Secondary | ICD-10-CM | POA: Insufficient documentation

## 2014-05-11 DIAGNOSIS — R768 Other specified abnormal immunological findings in serum: Secondary | ICD-10-CM | POA: Insufficient documentation

## 2014-06-01 DIAGNOSIS — R1314 Dysphagia, pharyngoesophageal phase: Secondary | ICD-10-CM | POA: Insufficient documentation

## 2014-06-28 DIAGNOSIS — K3184 Gastroparesis: Secondary | ICD-10-CM | POA: Insufficient documentation

## 2014-07-07 DIAGNOSIS — Z9981 Dependence on supplemental oxygen: Secondary | ICD-10-CM | POA: Insufficient documentation

## 2014-07-08 DIAGNOSIS — Z9109 Other allergy status, other than to drugs and biological substances: Secondary | ICD-10-CM | POA: Insufficient documentation

## 2014-10-20 DIAGNOSIS — J0100 Acute maxillary sinusitis, unspecified: Secondary | ICD-10-CM | POA: Insufficient documentation

## 2015-04-05 DIAGNOSIS — R310 Gross hematuria: Secondary | ICD-10-CM | POA: Insufficient documentation

## 2015-04-05 DIAGNOSIS — R338 Other retention of urine: Secondary | ICD-10-CM | POA: Insufficient documentation

## 2015-06-17 DIAGNOSIS — F4321 Adjustment disorder with depressed mood: Secondary | ICD-10-CM | POA: Insufficient documentation

## 2015-08-10 ENCOUNTER — Ambulatory Visit: Payer: Managed Care, Other (non HMO)

## 2015-08-10 ENCOUNTER — Encounter (INDEPENDENT_AMBULATORY_CARE_PROVIDER_SITE_OTHER): Payer: Self-pay

## 2015-10-16 ENCOUNTER — Emergency Department
Admission: EM | Admit: 2015-10-16 | Discharge: 2015-10-16 | Disposition: A | Discharge status: Home / Self Care | Payer: Managed Care, Other (non HMO) | Attending: Emergency Medicine | Admitting: Emergency Medicine

## 2015-10-16 ENCOUNTER — Encounter: Payer: Self-pay | Admitting: Emergency Medicine

## 2015-10-16 DIAGNOSIS — G4700 Insomnia, unspecified: Secondary | ICD-10-CM | POA: Insufficient documentation

## 2015-10-16 DIAGNOSIS — R531 Weakness: Secondary | ICD-10-CM

## 2015-10-16 DIAGNOSIS — Z791 Long term (current) use of non-steroidal anti-inflammatories (NSAID): Secondary | ICD-10-CM | POA: Insufficient documentation

## 2015-10-16 DIAGNOSIS — Z79899 Other long term (current) drug therapy: Secondary | ICD-10-CM | POA: Insufficient documentation

## 2015-10-16 DIAGNOSIS — Z87891 Personal history of nicotine dependence: Secondary | ICD-10-CM | POA: Insufficient documentation

## 2015-10-16 DIAGNOSIS — J45909 Unspecified asthma, uncomplicated: Secondary | ICD-10-CM | POA: Insufficient documentation

## 2015-10-16 DIAGNOSIS — F329 Major depressive disorder, single episode, unspecified: Secondary | ICD-10-CM | POA: Insufficient documentation

## 2015-10-16 HISTORY — DX: Major depressive disorder, single episode, unspecified: F32.9

## 2015-10-16 HISTORY — DX: Irritable bowel syndrome, unspecified: K58.9

## 2015-10-16 HISTORY — DX: Insomnia, unspecified: G47.00

## 2015-10-16 HISTORY — DX: Unspecified osteoarthritis, unspecified site: M19.90

## 2015-10-16 HISTORY — DX: Post-traumatic stress disorder, unspecified: F43.10

## 2015-10-16 HISTORY — DX: Unspecified asthma, uncomplicated: J45.909

## 2015-10-16 HISTORY — DX: Complex regional pain syndrome I, unspecified: G90.50

## 2015-10-16 HISTORY — DX: Depression, unspecified: F32.A

## 2015-10-16 LAB — COMPREHENSIVE METABOLIC PANEL
ALT: 47 U/L (ref 14–54)
ANION GAP: 8 (ref 5–15)
AST: 49 U/L — AB (ref 15–41)
Albumin: 4.9 g/dL (ref 3.5–5.0)
Alkaline Phosphatase: 131 U/L — ABNORMAL HIGH (ref 38–126)
BILIRUBIN TOTAL: 0.8 mg/dL (ref 0.3–1.2)
BUN: 12 mg/dL (ref 6–20)
CO2: 28 mmol/L (ref 22–32)
Calcium: 9.8 mg/dL (ref 8.9–10.3)
Chloride: 100 mmol/L — ABNORMAL LOW (ref 101–111)
Creatinine, Ser: 0.82 mg/dL (ref 0.44–1.00)
GFR calc Af Amer: >60 mL/min (ref >60)
Glucose, Bld: 119 mg/dL — ABNORMAL HIGH (ref 65–99)
POTASSIUM: 3.9 mmol/L (ref 3.5–5.1)
Sodium: 136 mmol/L (ref 135–145)
TOTAL PROTEIN: 8 g/dL (ref 6.5–8.1)

## 2015-10-16 LAB — CBC WITH DIFFERENTIAL/PLATELET
Basophils Absolute: 0 10*3/uL (ref 0–0.1)
Basophils Relative: 0 %
Eosinophils Absolute: 0 10*3/uL (ref 0–0.7)
Eosinophils Relative: 0 %
HEMATOCRIT: 42 % (ref 35.0–47.0)
Hemoglobin: 14.9 g/dL (ref 12.0–16.0)
LYMPHS ABS: 2 10*3/uL (ref 1.0–3.6)
LYMPHS PCT: 24 %
MCH: 31.1 pg (ref 26.0–34.0)
MCHC: 35.4 g/dL (ref 32.0–36.0)
MCV: 87.7 fL (ref 80.0–100.0)
MONO ABS: 0.6 10*3/uL (ref 0.2–0.9)
MONOS PCT: 7 %
NEUTROS ABS: 5.9 10*3/uL (ref 1.4–6.5)
Neutrophils Relative %: 69 %
Platelets: 232 10*3/uL (ref 150–440)
RBC: 4.79 MIL/uL (ref 3.80–5.20)
RDW: 14.9 % — AB (ref 11.5–14.5)
WBC: 8.6 10*3/uL (ref 3.6–11.0)

## 2015-10-16 MED ORDER — ZOLPIDEM TARTRATE ER 12.5 MG PO TBCR: 12.5000 mg | EXTENDED_RELEASE_TABLET | Freq: Every evening | ORAL | 1 refills | Status: DC | PRN

## 2015-10-16 MED ORDER — SODIUM CHLORIDE 0.9 % IV SOLN
Freq: Once | INTRAVENOUS | Status: AC
  Administered 2015-10-16: 15:00:00 via INTRAVENOUS

## 2015-10-16 MED ORDER — LORAZEPAM 2 MG/ML IJ SOLN
1.0000 mg | Freq: Once | INTRAMUSCULAR | Status: AC
  Administered 2015-10-16: 1 mg via INTRAVENOUS
  Filled 2015-10-16: qty 1

## 2015-10-16 MED ORDER — ONDANSETRON HCL 4 MG/2ML IJ SOLN
4.0000 mg | Freq: Once | INTRAMUSCULAR | Status: AC
  Administered 2015-10-16: 4 mg via INTRAVENOUS
  Filled 2015-10-16: qty 2

## 2015-10-16 NOTE — ED Provider Notes (Signed)
[facility] Emergency Department Provider Note        Time seen: ----------------------------------------- 2:22 PM on 10/16/2015 -----------------------------------------    I have reviewed the triage vital signs and the nursing notes.   HISTORY  Chief Complaint Insomnia    HPI Katie Neal is a 45 y.o. female who presents to ER feeling dehydrated and having insomnia. Patient states she's only slept a couple hours in the last 3 or 4 days. Patient is concerned she may need her medications adjusted. She typically takes her own medications but is not sure if she is taking them correctly currently. Patient states she feels disoriented for the past 48 hours. She recently moved here, has complex regional pain, PTSD, IBS, depression and insomnia.   Past Medical History:  Diagnosis Date  . Arthritis   . Asthma   . CRPS (complex regional pain syndrome type I)   . Depression   . IBS (irritable bowel syndrome)   . Insomnia   . PTSD (post-traumatic stress disorder)     There are no active problems to display for this patient.   Past Surgical History:  Procedure Laterality Date  . ABDOMINAL HYSTERECTOMY    . ACHILLES TENDON REPAIR  1982  . APPENDECTOMY    . FOOT SURGERY  2002   due to deformity  . HIP SURGERY  2012  . KNEE ARTHROSCOPY    . LAPAROSCOPIC HYSTERECTOMY  2000  . REPAIR TENDONS LEG     x2    Allergies Codeine  Social History Social History  Substance Use Topics  . Smoking status: Former Smoker    Types: Cigarettes  . Smokeless tobacco: Never Used  . Alcohol use No     Comment: very rarely    Review of Systems Constitutional: Negative for fever. Cardiovascular: Negative for chest pain. Respiratory: Negative for shortness of breath. Gastrointestinal: Negative for abdominal pain, vomiting and diarrhea. Genitourinary: Negative for dysuria. Musculoskeletal: Negative for back pain. Skin: Negative for rash. Neurological:  Negative for headaches,Positive for generalized weakness Psychiatric: Positive for insomnia  10-point ROS otherwise negative.  ____________________________________________   PHYSICAL EXAM:  VITAL SIGNS: ED Triage Vitals  Enc Vitals Group     BP 10/16/15 1258 138/86     Pulse Rate 10/16/15 1258 78     Resp --      Temp 10/16/15 1258 98.6 F (37 C)     Temp Source 10/16/15 1258 Oral     SpO2 10/16/15 1258 99 %     Weight 10/16/15 1259 195 lb (88.5 kg)     Height 10/16/15 1259 5\' 5"  (1.651 m)     Head Circumference --      Peak Flow --      Pain Score 10/16/15 1303 7     Pain Loc --      Pain Edu? --      Excl. in GC? --     Constitutional: Alert and oriented. No acute distress Eyes: Conjunctivae are normal. PERRL. Normal extraocular movements. ENT   Head: Normocephalic and atraumatic.   Nose: No congestion/rhinnorhea.   Mouth/Throat: Mucous membranes are moist.   Neck: No stridor. Cardiovascular: Normal rate, regular rhythm. No murmurs, rubs, or gallops. Respiratory: Normal respiratory effort without tachypnea nor retractions. Breath sounds are clear and equal bilaterally. No wheezes/rales/rhonchi. Gastrointestinal: Soft and nontender. Normal bowel sounds Musculoskeletal: Nontender with normal range of motion in all extremities. Diffuse lower extremity tenderness, worse in the left leg. Patient states this is her complex  regional pain syndrome type  Neurologic:  Normal speech and language. No gross focal neurologic deficits are appreciated.  Skin:  Skin is warm, dry and intact. No rash noted. Psychiatric: Depressed mood and affect. ____________________________________________  ED COURSE:  Pertinent labs & imaging results that were available during my care of the patient were reviewed by me and considered in my medical decision making (see chart for details). Clinical Course  Patient presents in no acute distress with symptoms that are likely multifactorial,  mostly behavioral medicine related. I will check basic labs, give IV fluid and reevaluate.  Procedures ____________________________________________   LABS (pertinent positives/negatives)  Labs Reviewed  CBC WITH DIFFERENTIAL/PLATELET - Abnormal; Notable for the following:       Result Value   RDW 14.9 (*)    All other components within normal limits  COMPREHENSIVE METABOLIC PANEL - Abnormal; Notable for the following:    Chloride 100 (*)    Glucose, Bld 119 (*)    AST 49 (*)    Alkaline Phosphatase 131 (*)    All other components within normal limits  URINALYSIS COMPLETEWITH MICROSCOPIC ([facility] ONLY)  ____________________________________________  FINAL ASSESSMENT AND PLAN  Fatigue, insomnia  Plan: Patient with labs as dictated above. Patient is in no distress, labs are reassuring. I will prescribe a sleep aid to try to help her sleep at night. I believe this is the cause of most of her symptoms. She's received a liter of fluid, she does not look significantly dehydrated. She'll be stable for outpatient follow-up.   Emily Filbert, MD   Note: This dictation was prepared with Dragon dictation. Any transcriptional errors that result from this process are unintentional    Emily Filbert, MD 10/16/15 [phone]

## 2015-10-16 NOTE — ED Triage Notes (Signed)
States feels dehydrated, and insomnia since Thursday and believes she may need medications adjusted

## 2015-10-16 NOTE — ED Triage Notes (Signed)
Pt reports she is feeling disoriented for the past 48 hours and states when she feels like this it is usually a medication problem. Pt recently [facility] from the area [facility]. Pt on home oxygen 2-3L via [place] continuous..Marland Kitchen

## 2015-10-16 NOTE — ED Notes (Signed)
Pt requesting sheet and room darkened. Explained to patient MD would be seeing her soon and that he was with another patient.

## 2015-10-16 NOTE — ED Notes (Signed)

## 2015-12-06 DIAGNOSIS — R942 Abnormal results of pulmonary function studies: Secondary | ICD-10-CM | POA: Insufficient documentation

## 2016-03-05 ENCOUNTER — Telehealth: Payer: Self-pay | Admitting: Pharmacist

## 2016-03-05 NOTE — Telephone Encounter (Signed)
Called [facility] for refill on Keppra.

## 2016-03-22 ENCOUNTER — Emergency Department: Payer: Medicaid Other

## 2016-03-22 ENCOUNTER — Emergency Department
Admission: EM | Admit: 2016-03-22 | Discharge: 2016-03-22 | Disposition: A | Discharge status: Home / Self Care | Payer: Medicaid Other | Attending: Emergency Medicine | Admitting: Emergency Medicine

## 2016-03-22 ENCOUNTER — Encounter: Payer: Self-pay | Admitting: Emergency Medicine

## 2016-03-22 DIAGNOSIS — R05 Cough: Secondary | ICD-10-CM | POA: Insufficient documentation

## 2016-03-22 DIAGNOSIS — J45909 Unspecified asthma, uncomplicated: Secondary | ICD-10-CM | POA: Diagnosis not present

## 2016-03-22 DIAGNOSIS — R079 Chest pain, unspecified: Secondary | ICD-10-CM

## 2016-03-22 DIAGNOSIS — R0602 Shortness of breath: Secondary | ICD-10-CM | POA: Diagnosis not present

## 2016-03-22 DIAGNOSIS — R0789 Other chest pain: Secondary | ICD-10-CM | POA: Diagnosis present

## 2016-03-22 DIAGNOSIS — Z87891 Personal history of nicotine dependence: Secondary | ICD-10-CM | POA: Insufficient documentation

## 2016-03-22 DIAGNOSIS — Z79899 Other long term (current) drug therapy: Secondary | ICD-10-CM | POA: Insufficient documentation

## 2016-03-22 DIAGNOSIS — R0600 Dyspnea, unspecified: Secondary | ICD-10-CM

## 2016-03-22 LAB — FIBRIN DERIVATIVES D-DIMER (ARMC ONLY): FIBRIN DERIVATIVES D-DIMER (ARMC): 594.96 — AB (ref 0–499)

## 2016-03-22 LAB — CBC WITH DIFFERENTIAL/PLATELET
BASOS ABS: 0 10*3/uL (ref 0–0.1)
BASOS PCT: 0 %
EOS PCT: 6 %
Eosinophils Absolute: 0.8 10*3/uL — ABNORMAL HIGH (ref 0–0.7)
HEMATOCRIT: 41.6 % (ref 35.0–47.0)
Hemoglobin: 14.2 g/dL (ref 12.0–16.0)
Lymphocytes Relative: 32 %
Lymphs Abs: 4.6 10*3/uL — ABNORMAL HIGH (ref 1.0–3.6)
MCH: 30.5 pg (ref 26.0–34.0)
MCHC: 34.2 g/dL (ref 32.0–36.0)
MCV: 89.2 fL (ref 80.0–100.0)
MONO ABS: 1.8 10*3/uL — AB (ref 0.2–0.9)
MONOS PCT: 12 %
NEUTROS ABS: 7.1 10*3/uL — AB (ref 1.4–6.5)
Neutrophils Relative %: 50 %
PLATELETS: 219 10*3/uL (ref 150–440)
RBC: 4.66 MIL/uL (ref 3.80–5.20)
RDW: 13.8 % (ref 11.5–14.5)
WBC: 14.4 10*3/uL — ABNORMAL HIGH (ref 3.6–11.0)

## 2016-03-22 LAB — TROPONIN I: Troponin I: <0.03 ng/mL (ref <0.03)

## 2016-03-22 LAB — COMPREHENSIVE METABOLIC PANEL
ALK PHOS: 110 U/L (ref 38–126)
ALT: 32 U/L (ref 14–54)
AST: 31 U/L (ref 15–41)
Albumin: 4.7 g/dL (ref 3.5–5.0)
Anion gap: 11 (ref 5–15)
BILIRUBIN TOTAL: 0.7 mg/dL (ref 0.3–1.2)
BUN: 11 mg/dL (ref 6–20)
CALCIUM: 9.6 mg/dL (ref 8.9–10.3)
CO2: 26 mmol/L (ref 22–32)
CREATININE: 1.08 mg/dL — AB (ref 0.44–1.00)
Chloride: 101 mmol/L (ref 101–111)
Glucose, Bld: 120 mg/dL — ABNORMAL HIGH (ref 65–99)
Potassium: 3 mmol/L — ABNORMAL LOW (ref 3.5–5.1)
Sodium: 138 mmol/L (ref 135–145)
TOTAL PROTEIN: 7.5 g/dL (ref 6.5–8.1)

## 2016-03-22 LAB — BRAIN NATRIURETIC PEPTIDE: B NATRIURETIC PEPTIDE 5: 21 pg/mL (ref 0.0–100.0)

## 2016-03-22 MED ORDER — IPRATROPIUM-ALBUTEROL 0.5-2.5 (3) MG/3ML IN SOLN
3.0000 mL | Freq: Once | RESPIRATORY_TRACT | Status: AC
  Administered 2016-03-22: 3 mL via RESPIRATORY_TRACT
  Filled 2016-03-22: qty 3

## 2016-03-22 MED ORDER — IOPAMIDOL (ISOVUE-370) INJECTION 76%
75.0000 mL | Freq: Once | INTRAVENOUS | Status: AC | PRN
  Administered 2016-03-22: 75 mL via INTRAVENOUS

## 2016-03-22 NOTE — ED Provider Notes (Signed)
[facility] Emergency Department Provider Note   ____________________________________________   First MD Initiated Contact with Patient 03/22/16 1353     (approximate)  I have reviewed the triage vital signs and the nursing notes.   HISTORY  Chief Complaint Shortness of Breath    HPI Katie LangeMichelle L Neal is a 46 y.o. female with a very long and complex medical history as revealed by reviewing the history [facility] who comes in complaining chest tightness which came on last night and has stayed there. Patient has had this before. Patient reports it really doesn't made worse by anythiimes in the past e better by l not this time. She is constantly nauseated from gastroparesis. She has a cough but is not productive that is not new either. She has a temperature that goes up to 99 and back down again. She has a history of lung damage from aspiration pneumonia in the past.the chest tightness feels like someone is wringing out a towel in her chest.   Past Medical History:  Diagnosis Date  . Arthritis   . Asthma   . CRPS (complex regional pain syndrome type I)   . Depression   . IBS (irritable bowel syndrome)   . Insomnia   . PTSD (post-traumatic stress disorder)     There are no active problems to display for this patient.   Past Surgical History:  Procedure Laterality Date  . ABDOMINAL HYSTERECTOMY    . ACHILLES TENDON REPAIR  1982  . APPENDECTOMY    . FOOT SURGERY  2002   due to deformity  . HIP SURGERY  2012  . KNEE ARTHROSCOPY    . LAPAROSCOPIC HYSTERECTOMY  2000  . REPAIR TENDONS LEG     x2    Prior to Admission medications   Medication Sig Start Date End Date Taking? Authorizing Provider  albuterol (PROVENTIL HFA;VENTOLIN HFA) 108 (90 Base) MCG/ACT inhaler Inhale 2 puffs into the lungs every 6 (six) hours as needed for wheezing. 12/06/15  Yes Historical Provider, MD  Ascorbic Acid (VITAMIN C WITH ROSE  HIPS) 1000 MG tablet Take 1,000 mg by mouth 2 (two) times daily.   Yes Historical Provider, MD  baclofen (LIORESAL) 10 MG tablet Take 10 mg by mouth 3 (three) times daily. 02/27/14  Yes Historical Provider, MD  Cholecalciferol (VITAMIN D-1000 MAX ST) 1000 units tablet Take 5,000 Units by mouth daily.   Yes Historical Provider, MD  dicyclomine (BENTYL) 20 MG tablet Take 20 mg by mouth 3 (three) times daily as needed for pain. 08/30/14  Yes Historical Provider, MD  doxepin (SINEQUAN) 10 MG capsule TAKE ONE CAPSULE BY MOUTH AT BEDTIME 01/04/12  Yes Ryan M Dunn, PA-C  gabapentin (NEURONTIN) 600 MG tablet Take 1,200 mg by mouth 3 (three) times daily.    Yes Historical Provider, MD  hydroxychloroquine (PLAQUENIL) 200 MG tablet Take 200 mg by mouth 2 (two) times daily.   Yes Historical Provider, MD  hydrOXYzine (VISTARIL) 25 MG capsule Take 25 mg by mouth 3 (three) times daily as needed for anxiety. 03/14/16 04/13/16 Yes Historical Provider, MD  lamoTRIgine (LAMICTAL) 200 MG tablet Take 200 mg by mouth daily. 03/14/16 06/12/16 Yes Historical Provider, MD  levETIRAcetam (KEPPRA) 750 MG tablet Take 750 mg by mouth 2 (two) times daily. 03/01/16 03/01/17 Yes Historical Provider, MD  levothyroxine (SYNTHROID, LEVOTHROID) 137 MCG tablet Take 137 mcg by mouth daily. 11/10/15  Yes Historical Provider, MD  metoprolol succinate (TOPROL-XL) 25  MG 24 hr tablet Take 25 mg by mouth daily. 07/05/15 07/04/16 Yes Historical Provider, MD  montelukast (SINGULAIR) 10 MG tablet Take 10 mg by mouth daily. 12/06/15  Yes Historical Provider, MD  morphine (MS CONTIN) 30 MG 12 hr tablet Take 30 mg by mouth every 12 (twelve) hours. 02/13/16  Yes Historical Provider, MD  morphine (MSIR) 15 MG tablet Take 15 mg by mouth every 12 (twelve) hours. 02/13/16  Yes Historical Provider, MD  Olopatadine HCl (PAZEO) 0.7 % SOLN Place 1 drop into both eyes daily as needed.   Yes Historical Provider, MD  ondansetron (ZOFRAN-ODT) 4 MG disintegrating tablet Take 4  mg by mouth every 8 (eight) hours as needed for nausea. 01/13/15  Yes Historical Provider, MD  simvastatin (ZOCOR) 20 MG tablet Take 20 mg by mouth daily. 03/12/14  Yes Historical Provider, MD  Tiotropium Bromide Monohydrate 2.5 MCG/ACT AERS Inhale 5 mcg into the lungs daily. 11/18/15  Yes Historical Provider, MD  traZODone (DESYREL) 100 MG tablet Take 2 tablets (200 mg total) by mouth at bedtime. Needs office visit 12/14/11  Yes Nelva Nay, PA-C  venlafaxine XR (EFFEXOR-XR) 75 MG 24 hr capsule Take 3 capsules (225 mg total) by mouth daily. 06/14/11  Yes Lamar Laundry, MD  vitamin B-12 (CYANOCOBALAMIN) 1000 MCG tablet Take 1,000 mcg by mouth daily.   Yes Historical Provider, MD  vitamin E 400 UNIT capsule Take 400 Units by mouth daily.   Yes Historical Provider, MD  ALPRAZolam (XANAX) 1 MG tablet TAKE TWO (2) TABLETS BY MOUTH TWICE DAILY. Patient not taking: Reported on 03/22/2016 12/14/11   Godfrey Pick, PA-C  SUMAtriptan (IMITREX) 100 MG tablet Take 1 tablet (100 mg total) by mouth once as needed for migraine. Patient not taking: Reported on 03/22/2016 06/14/11   Lamar Laundry, MD  zolpidem (AMBIEN CR) 12.5 MG CR tablet Take 1 tablet (12.5 mg total) by mouth at bedtime as needed for sleep. Patient not taking: Reported on 03/22/2016 10/16/15 10/15/16  Emily Filbert, MD    Allergies Codeine  No family history on file.  Social History Social History  Substance Use Topics  . Smoking status: Former Smoker    Types: Cigarettes  . Smokeless tobacco: Never Used  . Alcohol use No     Comment: very rarely    Review of Systems Constitutional: No fever/chills Eyes: No visual changes. ENT: No sore throat. Cardiovascular: Denies chest painshe does have chest tightness however. Respiratory:shortness of breath. Gastrointestinal: No abdominal pain.  No nausea, no vomiting.  No diarrhea.  No constipation. Genitourinary: Negative for dysuria.  10-point ROS otherwise  negative.  ____________________________________________   PHYSICAL EXAM:  VITAL SIGNS: ED Triage Vitals  Enc Vitals Group     BP 03/22/16 1344 102/72     Pulse Rate 03/22/16 1344 96     Resp --      Temp 03/22/16 1344 98.2 F (36.8 C)     Temp Source 03/22/16 1344 Oral     SpO2 03/22/16 1344 94 %     Weight 03/22/16 1346 210 lb (95.3 kg)     Height 03/22/16 1346 5\' 5"  (1.651 m)     Head Circumference --      Peak Flow --      Pain Score 03/22/16 1419 7     Pain Loc --      Pain Edu? --      Excl. in GC? --     Constitutional: Alert and oriented. Chronically  ill appearing and in no acute distress. Eyes: Conjunctivae are normal. PERRL. EOMI. Head: Atraumatic. Nose: No congestion/rhinnorhea. Mouth/Throat: Mucous membranes are moist.  Oropharynx non-erythematous. Neck: No stridor.   Cardiovascular: Normal rate, regular rhythm. Grossly normal heart sounds.  Good peripheral circulation. Respiratory: Normal respiratory effort.  No retractions. Lungsoccasional scattered crackles Gastrointestinal: Soft and nontender. No distention. No abdominal bruits. No CVA tenderness. Musculoskeletal: No lower extremity tenderness nor edema.  No joint effusions.   ____________________________________________   LABS (all labs ordered are listed, but only abnormal results are displayed)  Labs Reviewed  COMPREHENSIVE METABOLIC PANEL - Abnormal; Notable for the following:       Result Value   Potassium 3.0 (*)    Glucose, Bld 120 (*)    Creatinine, Ser 1.08 (*)    All other components within normal limits  CBC WITH DIFFERENTIAL/PLATELET - Abnormal; Notable for the following:    WBC 14.4 (*)    Neutro Abs 7.1 (*)    Lymphs Abs 4.6 (*)    Monocytes Absolute 1.8 (*)    Eosinophils Absolute 0.8 (*)    All other components within normal limits  TROPONIN I  BRAIN NATRIURETIC PEPTIDE  TROPONIN I  FIBRIN DERIVATIVES D-DIMER ([facility] ONLY)    ____________________________________________  EKG  EKG read and interpreted by me shows normal sinus rhythm rate of 92 normal axispatient has diffuse T-wave flattening there are no old T EKGs that I can find ____________________________________________  RADIOLOGY  Study Result   CLINICAL DATA:  Shortness of breath.  Nonproductive cough.  EXAM: CHEST  2 VIEW  COMPARISON:  None.  FINDINGS: The cardiomediastinal silhouette is within normal limits. There is mild motion artifact on the lateral radiograph. The lungs are mildly hypoinflated with mild linear and hazy opacities in both lung bases. A staple line is noted in the right mid lung. No overt pulmonary edema, pleural effusion, or pneumothorax is identified. No acute osseous abnormality is seen.  IMPRESSION: Mild hypoinflation with mild bibasilar opacities favored to reflect atelectasis and scarring.   Electronically Signed   By: Sebastian Ache M.D.   On: 03/22/2016 14:24    ____________________________________________   PROCEDURES  Procedure(s) performed:   Procedures  Critical Care performed:   ____________________________________________   INITIAL IMPRESSION / ASSESSMENT AND PLAN / ED COURSE  Pertinent labs & imaging results that were available during my care of the patient were reviewed by me and considered in my medical decision making (see chart for details).  Dr Mayford Knife will follow up for the ddimer abnd second troponin      ____________________________________________   FINAL CLINICAL IMPRESSION(S) / ED DIAGNOSES  Final diagnoses:  Dyspnea, unspecified type      NEW MEDICATIONS STARTED DURING THIS VISIT:  New Prescriptions   No medications on file     Note:  This document was prepared using Dragon voice recognition software and may include unintentional dictation errors.    Arnaldo Natal, MD 03/22/16 [phone]

## 2016-03-22 NOTE — ED Notes (Signed)
Pt took home medication for pain, Morphine sulfate 15 mg PO. Dr. Darnelle CatalanMalinda aware.

## 2016-03-22 NOTE — ED Triage Notes (Signed)
Pt in via POV with complaints of worsening shortness of breath and dry cough since last night.  Pt reports hx of aspiration pneumonia.  NAD noted at this time.

## 2016-03-22 NOTE — ED Provider Notes (Signed)
IMPRESSION: 1. No CTA evidence for acute pulmonary embolus. 2. Postsurgical change posterior right lung with adjacent peripheral wedge-shaped collapse/consolidation and underlying small pleural effusion versus pleural thickening. These changes are new since a CT abdomen of 10/04/2008. Correlation for history right lung surgery recommended and comparison to previous chest CT suggested to ensure that these changes are stable. 3. Hepatic steatosis.     Emily FilbertJonathan E Williams, MD 03/22/16 [phone]

## 2016-03-22 NOTE — ED Notes (Signed)
Patient SpO2 94% on RA. Patient states she normally wears 2L [place] at home. Patient placed on 2L. SpO2 98%.

## 2016-03-22 NOTE — ED Notes (Signed)
Patient in xray at this time. Will assess once patient is back in room.

## 2016-04-19 ENCOUNTER — Telehealth: Payer: Self-pay | Admitting: Pharmacist

## 2016-04-19 NOTE — Telephone Encounter (Signed)
s/w Dr. Hezzie BumpGeorge's RN to notify MD that medication error was dispensed to pt. Explained that Levofloxacin 750mg   Po BID was filled on 03/21/16 and picked up on 03/22/16. Intended medication was levetiracetam 750mg . The error happened when a profiled rx was filled and the pharmacy software system did not flag that the drug was changed. Pt sees multiple MDs with mutiple scripts from different MDs on file. Rn stated that she would give to Dr. Greggory StallionGeorge to review and contact pt if any intervention was necessary.

## 2016-04-19 NOTE — Telephone Encounter (Signed)
s/w patient stated that levofloxacin 750mg  po bid had been filled on 03/21/16 picked up on 03/22/16 to pt. Medication intended was levetiracetam 750mg  po bid. I asked if pt had taken medication and she stated she stopped taking it when she rcv the brand name Keppra 750mg  which was dispensed to her from our clinic on 03/28/16. She said the levofloxacin vial looked like it was about half empty. I explained that I had called and left a detailed explanation with Dr. Hezzie BumpGeorge's nurse and that they would contact her if any labs or interventions were necessary. I explained that pt could bring the levofloxacin to our clinic today when she picks up her other meds and that we could dispose of it for her. She verbalized understanding and thanked me for calling.

## 2016-05-24 ENCOUNTER — Telehealth: Payer: Self-pay | Admitting: Pharmacist

## 2016-05-24 NOTE — Telephone Encounter (Signed)
05/24/16 Placed refill online with [facility] for Lamictal 200mg  & Ventolin HFA, to ship 06/06/16.

## 2016-06-28 ENCOUNTER — Telehealth: Payer: Self-pay | Admitting: Pharmacist

## 2016-06-28 NOTE — Telephone Encounter (Signed)
06/28/16 Called [facility] for a refill on Keppra 750mg , they will deliver to us 07/02/16 per Margot ChimesKandeia

## 2016-08-07 ENCOUNTER — Telehealth: Payer: Self-pay | Admitting: Pharmacy Technician

## 2016-08-07 NOTE — Telephone Encounter (Signed)
Patient eligible to receive medication assistance at [facility] through 2018, as long as eligibility requirements continue to be met.  Betty J. Kluttz Care Manager [facility] 

## 2016-09-19 ENCOUNTER — Telehealth: Payer: Self-pay | Admitting: Pharmacist

## 2016-09-19 NOTE — Telephone Encounter (Signed)
09/19/16 Faxed [employer] re enrollment application with a script for Ventolin HFA 90mcg Inhale 2 puffs into the lungs every 6 hours as needed for wheezing.Forde RadonAJ

## 2016-12-18 ENCOUNTER — Telehealth: Payer: Self-pay | Admitting: Adult Health Nurse Practitioner

## 2016-12-18 NOTE — Telephone Encounter (Signed)
Wants to be a patient. Needs eye and dental. On medicaid.

## 2016-12-21 DIAGNOSIS — Z8709 Personal history of other diseases of the respiratory system: Secondary | ICD-10-CM | POA: Insufficient documentation

## 2017-05-24 DIAGNOSIS — R29818 Other symptoms and signs involving the nervous system: Secondary | ICD-10-CM | POA: Insufficient documentation

## 2017-06-07 DIAGNOSIS — E669 Obesity, unspecified: Secondary | ICD-10-CM | POA: Insufficient documentation

## 2017-09-09 DIAGNOSIS — J449 Chronic obstructive pulmonary disease, unspecified: Secondary | ICD-10-CM | POA: Insufficient documentation

## 2017-10-23 ENCOUNTER — Emergency Department
Admission: EM | Admit: 2017-10-23 | Discharge: 2017-10-23 | Disposition: A | Discharge status: Home / Self Care | Payer: Medicare Other | Attending: Emergency Medicine | Admitting: Emergency Medicine

## 2017-10-23 ENCOUNTER — Other Ambulatory Visit: Payer: Self-pay

## 2017-10-23 DIAGNOSIS — R55 Syncope and collapse: Secondary | ICD-10-CM | POA: Diagnosis present

## 2017-10-23 DIAGNOSIS — S060X0A Concussion without loss of consciousness, initial encounter: Secondary | ICD-10-CM | POA: Insufficient documentation

## 2017-10-23 DIAGNOSIS — Z87891 Personal history of nicotine dependence: Secondary | ICD-10-CM | POA: Insufficient documentation

## 2017-10-23 DIAGNOSIS — R296 Repeated falls: Secondary | ICD-10-CM | POA: Insufficient documentation

## 2017-10-23 DIAGNOSIS — F329 Major depressive disorder, single episode, unspecified: Secondary | ICD-10-CM | POA: Diagnosis not present

## 2017-10-23 DIAGNOSIS — J45909 Unspecified asthma, uncomplicated: Secondary | ICD-10-CM | POA: Diagnosis not present

## 2017-10-23 DIAGNOSIS — Z79899 Other long term (current) drug therapy: Secondary | ICD-10-CM | POA: Diagnosis not present

## 2017-10-23 DIAGNOSIS — R32 Unspecified urinary incontinence: Secondary | ICD-10-CM | POA: Insufficient documentation

## 2017-10-23 LAB — CBC WITH DIFFERENTIAL/PLATELET
BASOS PCT: 1 %
Basophils Absolute: 0 10*3/uL (ref 0–0.1)
EOS ABS: 0.5 10*3/uL (ref 0–0.7)
EOS PCT: 5 %
HCT: 38.7 % (ref 35.0–47.0)
Hemoglobin: 13.8 g/dL (ref 12.0–16.0)
LYMPHS ABS: 3.8 10*3/uL — AB (ref 1.0–3.6)
Lymphocytes Relative: 40 %
MCH: 31.9 pg (ref 26.0–34.0)
MCHC: 35.6 g/dL (ref 32.0–36.0)
MCV: 89.6 fL (ref 80.0–100.0)
MONO ABS: 1.2 10*3/uL — AB (ref 0.2–0.9)
MONOS PCT: 13 %
Neutro Abs: 3.8 10*3/uL (ref 1.4–6.5)
Neutrophils Relative %: 41 %
Platelets: 225 10*3/uL (ref 150–440)
RBC: 4.32 MIL/uL (ref 3.80–5.20)
RDW: 13 % (ref 11.5–14.5)
WBC: 9.4 10*3/uL (ref 3.6–11.0)

## 2017-10-23 LAB — URINALYSIS, COMPLETE (UACMP) WITH MICROSCOPIC
Bilirubin Urine: NEGATIVE
GLUCOSE, UA: 50 mg/dL — AB
Hgb urine dipstick: NEGATIVE
Ketones, ur: NEGATIVE mg/dL
Leukocytes, UA: NEGATIVE
Nitrite: NEGATIVE
PROTEIN: NEGATIVE mg/dL
SPECIFIC GRAVITY, URINE: 1.009 (ref 1.005–1.030)
pH: 7 (ref 5.0–8.0)

## 2017-10-23 LAB — BASIC METABOLIC PANEL
Anion gap: 9 (ref 5–15)
BUN: 7 mg/dL (ref 6–20)
CALCIUM: 9.6 mg/dL (ref 8.9–10.3)
CO2: 29 mmol/L (ref 22–32)
CREATININE: 1.01 mg/dL — AB (ref 0.44–1.00)
Chloride: 101 mmol/L (ref 98–111)
GFR calc Af Amer: >60 mL/min (ref >60)
GFR calc non Af Amer: >60 mL/min (ref >60)
Glucose, Bld: 124 mg/dL — ABNORMAL HIGH (ref 70–99)
Potassium: 3.9 mmol/L (ref 3.5–5.1)
SODIUM: 139 mmol/L (ref 135–145)

## 2017-10-23 LAB — TROPONIN I

## 2017-10-23 MED ORDER — SODIUM CHLORIDE 0.9 % IV BOLUS
1000.0000 mL | Freq: Once | INTRAVENOUS | Status: AC
  Administered 2017-10-23: 1000 mL via INTRAVENOUS

## 2017-10-23 NOTE — ED Provider Notes (Signed)
[facility] Emergency Department Provider Note   ____________________________________________   I have reviewed the triage vital signs and the nursing notes.   HISTORY  Chief Complaint   History limited by: Not Limited   HPI CIARAH PEACE is a 47 y.o. female who presents to the emergency department today after a syncopal episode.  The patient was in a store.  She was with family and family states they were able to help her slowly to the ground.  Apparently the patient has had a number of syncopal episodes over the past few months.  The family is unsure why.  Patient denied any chest pain or palpitations.  Again she did not hit her head today.  Patient denies any fevers.  She does have complex regional pain syndrome and is concerned that this might be a symptom of that diagnosis.   Per medical record review patient has a history of CRPS  Past Medical History:  Diagnosis Date  . Arthritis   . Asthma   . CRPS (complex regional pain syndrome type I)   . Depression   . IBS (irritable bowel syndrome)   . Insomnia   . PTSD (post-traumatic stress disorder)     There are no active problems to display for this patient.   Past Surgical History:  Procedure Laterality Date  . ABDOMINAL HYSTERECTOMY    . ACHILLES TENDON REPAIR  1982  . APPENDECTOMY    . FOOT SURGERY  2002   due to deformity  . HIP SURGERY  2012  . KNEE ARTHROSCOPY    . LAPAROSCOPIC HYSTERECTOMY  2000  . REPAIR TENDONS LEG     x2    Prior to Admission medications   Medication Sig Start Date End Date Taking? Authorizing Provider  albuterol (PROVENTIL HFA;VENTOLIN HFA) 108 (90 Base) MCG/ACT inhaler Inhale 2 puffs into the lungs every 6 (six) hours as needed for wheezing. 12/06/15   [provider]  ALPRAZolam (XANAX) 1 MG tablet TAKE TWO (2) TABLETS BY MOUTH TWICE DAILY. Patient not taking: Reported on 03/22/2016 12/14/11   Godfrey Pick, PA-C  Ascorbic Acid (VITAMIN C WITH  ROSE HIPS) 1000 MG tablet Take 1,000 mg by mouth 2 (two) times daily.    [provider]  baclofen (LIORESAL) 10 MG tablet Take 10 mg by mouth 3 (three) times daily. 02/27/14   [provider]  Cholecalciferol (VITAMIN D-1000 MAX ST) 1000 units tablet Take 5,000 Units by mouth daily.    [provider]  dicyclomine (BENTYL) 20 MG tablet Take 20 mg by mouth 3 (three) times daily as needed for pain. 08/30/14   [provider]  doxepin (SINEQUAN) 10 MG capsule TAKE ONE CAPSULE BY MOUTH AT BEDTIME 01/04/12   Dunn, Raymon Mutton, PA-C  gabapentin (NEURONTIN) 600 MG tablet Take 1,200 mg by mouth 3 (three) times daily.     [provider]  hydroxychloroquine (PLAQUENIL) 200 MG tablet Take 200 mg by mouth 2 (two) times daily.    [provider]  lamoTRIgine (LAMICTAL) 200 MG tablet Take 200 mg by mouth daily. 03/14/16 06/12/16  [provider]  levETIRAcetam (KEPPRA) 750 MG tablet Take 750 mg by mouth 2 (two) times daily. 03/01/16 03/01/17  [provider]  levothyroxine (SYNTHROID, LEVOTHROID) 137 MCG tablet Take 137 mcg by mouth daily. 11/10/15   [provider]  metoprolol succinate (TOPROL-XL) 25 MG 24 hr tablet Take 25 mg by mouth daily. 07/05/15 07/04/16  [provider]  montelukast (SINGULAIR)  10 MG tablet Take 10 mg by mouth daily. 12/06/15   [provider]  morphine (MS CONTIN) 30 MG 12 hr tablet Take 30 mg by mouth every 12 (twelve) hours. 02/13/16   [provider]  morphine (MSIR) 15 MG tablet Take 15 mg by mouth every 12 (twelve) hours. 02/13/16   [provider]  Olopatadine HCl (PAZEO) 0.7 % SOLN Place 1 drop into both eyes daily as needed.    [provider]  ondansetron (ZOFRAN-ODT) 4 MG disintegrating tablet Take 4 mg by mouth every 8 (eight) hours as needed for nausea. 01/13/15   [provider]  simvastatin (ZOCOR) 20 MG tablet Take 20 mg by mouth daily. 03/12/14   [provider]  SUMAtriptan (IMITREX) 100 MG tablet Take 1 tablet (100 mg total) by mouth once as needed for migraine. Patient not taking: Reported on 03/22/2016 06/14/11   Lamar Laundry, MD  Tiotropium Bromide Monohydrate 2.5 MCG/ACT AERS Inhale 5 mcg into the lungs daily. 11/18/15   [provider]  traZODone (DESYREL) 100 MG tablet Take 2 tablets (200 mg total) by mouth at bedtime. Needs office visit 12/14/11   Nelva Nay, PA-C  venlafaxine XR (EFFEXOR-XR) 75 MG 24 hr capsule Take 3 capsules (225 mg total) by mouth daily. 06/14/11   Mayans, Assunta Gambles, MD  vitamin B-12 (CYANOCOBALAMIN) 1000 MCG tablet Take 1,000 mcg by mouth daily.    [provider]  vitamin E 400 UNIT capsule Take 400 Units by mouth daily.    [provider]  zolpidem (AMBIEN CR) 12.5 MG CR tablet Take 1 tablet (12.5 mg total) by mouth at bedtime as needed for sleep. Patient not taking: Reported on 03/22/2016 10/16/15 10/15/16  Emily Filbert, MD    Allergies Codeine  History reviewed. No pertinent family history.  Social History Social History   Tobacco Use  . Smoking status: Former Smoker    Types: Cigarettes  . Smokeless tobacco: Never Used  Substance Use Topics  . Alcohol use: No    Comment: very rarely  . Drug use: No    Review of Systems Constitutional: No fever/chills Eyes: No visual changes. ENT: No sore throat. Cardiovascular: Denies chest pain. Respiratory: Denies shortness of breath. Gastrointestinal: No abdominal pain.  No nausea, no vomiting.  No diarrhea.   Genitourinary: Negative for dysuria. Musculoskeletal: Positive for generalized pain.  Skin: Negative for rash. Neurological: Negative for headaches, focal weakness or numbness.  ____________________________________________   PHYSICAL EXAM:  VITAL SIGNS: ED Triage Vitals  Enc Vitals Group     BP --      Pulse --      Resp --      Temp 10/23/17 1828 98.3 F (36.8 C)     Temp Source 10/23/17 1828  Oral     SpO2 --      Weight 10/23/17 1830 250 lb (113.4 kg)     Height 10/23/17 1830 5\' 5"  (1.651 m)   Constitutional: Alert and oriented.  Eyes: Conjunctivae are normal.  ENT      Head: Normocephalic and atraumatic.      Nose: No congestion/rhinnorhea.      Mouth/Throat: Mucous membranes are moist.      Neck: No stridor. Hematological/Lymphatic/Immunilogical: No cervical lymphadenopathy. Cardiovascular: Normal rate, regular rhythm.  No murmurs, rubs, or gallops.  Respiratory: Normal respiratory effort without tachypnea nor retractions. Breath sounds are clear and equal bilaterally. No wheezes/rales/rhonchi. Gastrointestinal: Soft and non tender. No rebound. No guarding.  Genitourinary: Deferred Musculoskeletal: Normal range of motion in all extremities. No lower extremity edema. Neurologic:  Normal speech and language. No gross focal neurologic deficits are appreciated.  Skin:  Skin is warm, dry and intact. No rash noted. Psychiatric: Mood and affect are normal. Speech and behavior are normal. Patient exhibits appropriate insight and judgment.  ____________________________________________    LABS (pertinent positives/negatives)  Trop <0.03 BMP wnl except glu 124, cr 1.01 CBC wbc 9.4, hgb 13.8, plt 225 UA hazy, 0-5 rbc and wbc, few bacteria, 6-10 squamous  ____________________________________________   EKG  I, Phineas Semen, attending physician, personally viewed and interpreted this EKG  EKG Time: 1808 Rate: 90 Rhythm: sinus rhythm Axis: normal Intervals: qtc 472 QRS: narrow, q waves III ST changes: no st elevation Impression: abnormal ekg   ____________________________________________    RADIOLOGY  None   ____________________________________________   PROCEDURES  Procedures  ____________________________________________   INITIAL IMPRESSION / ASSESSMENT AND PLAN / ED COURSE  Pertinent labs & imaging results that were available during my care of  the patient were reviewed by me and considered in my medical decision making (see chart for details).   Patient presented to the emergency department today after syncopal episode.  Apparently the patient has had a number of syncopal episodes recently.  Today's work-up without obvious anemia, electrolyte abnormality, heart damage or infection that would be causing the symptoms.  No evidence of traumatic injury today.  Discussed findings with patient.  Will plan on discharging to follow-up with primary care.   ____________________________________________   FINAL CLINICAL IMPRESSION(S) / ED DIAGNOSES  Final diagnoses:  Syncope, unspecified syncope type     Note: This dictation was prepared with Dragon dictation. Any transcriptional errors that result from this process are unintentional     Phineas Semen, MD 10/23/17 2124

## 2017-10-23 NOTE — Discharge Instructions (Addendum)
Please seek medical attention for any high fevers, chest pain, shortness of breath, change in behavior, persistent vomiting, bloody stool or any other new or concerning symptoms.  

## 2017-10-23 NOTE — ED Notes (Signed)
Resumed care from [place] rn.  Pt alert.  nsr on monitor   Iv fluids infusing.  Family with pt.  Pt alert

## 2017-10-23 NOTE — ED Triage Notes (Signed)
Pt arrives via [facility] from [facility]. Pt is tearful upon arrival stating her complex regional pain syndrome is flaring up. EMS reports pt had laid down in the floor at hobby lobby when the "pain hit" Placed a sheet on pt and she screamed in pain. Vital signs are difficult to obtain as BP cuff causes 20/10 pain on pain scale. Therapeutic communication and relaxation techniques prove useful in calming pt.

## 2017-11-27 DIAGNOSIS — R55 Syncope and collapse: Secondary | ICD-10-CM | POA: Insufficient documentation

## 2017-11-27 DIAGNOSIS — R569 Unspecified convulsions: Secondary | ICD-10-CM | POA: Insufficient documentation

## 2018-01-30 DIAGNOSIS — F0781 Postconcussional syndrome: Secondary | ICD-10-CM | POA: Insufficient documentation

## 2018-01-30 DIAGNOSIS — R42 Dizziness and giddiness: Secondary | ICD-10-CM | POA: Insufficient documentation

## 2018-02-24 ENCOUNTER — Other Ambulatory Visit: Payer: Self-pay

## 2018-02-24 ENCOUNTER — Encounter: Payer: Self-pay | Admitting: Physical Therapy

## 2018-02-24 ENCOUNTER — Ambulatory Visit: Payer: Medicare Other | Attending: Physician Assistant | Admitting: Physical Therapy

## 2018-02-24 VITALS — BP 132/82 | HR 71

## 2018-02-24 DIAGNOSIS — M6281 Muscle weakness (generalized): Secondary | ICD-10-CM | POA: Diagnosis present

## 2018-02-24 DIAGNOSIS — G90523 Complex regional pain syndrome I of lower limb, bilateral: Secondary | ICD-10-CM | POA: Diagnosis present

## 2018-02-24 DIAGNOSIS — M79605 Pain in left leg: Secondary | ICD-10-CM | POA: Insufficient documentation

## 2018-02-24 DIAGNOSIS — Z9181 History of falling: Secondary | ICD-10-CM | POA: Diagnosis present

## 2018-02-24 DIAGNOSIS — R262 Difficulty in walking, not elsewhere classified: Secondary | ICD-10-CM | POA: Insufficient documentation

## 2018-02-24 DIAGNOSIS — M79604 Pain in right leg: Secondary | ICD-10-CM | POA: Diagnosis not present

## 2018-02-24 NOTE — Therapy (Signed)
[facility] [address] [place], [name], [address] Phone: [phone]   Fax:  [phone]  Physical Therapy Evaluation  Patient Details  Name: Katie Neal MRN: [id number] Date of Birth: 1970/07/02 Referring Provider (PT): Albertina Parr PA-C   Encounter Date: 02/24/2018    Past Medical History:  Diagnosis Date  . Arthritis   . Asthma   . CRPS (complex regional pain syndrome type I)   . Depression   . IBS (irritable bowel syndrome)   . Insomnia   . PTSD (post-traumatic stress disorder)     Past Surgical History:  Procedure Laterality Date  . ABDOMINAL HYSTERECTOMY    . ACHILLES TENDON REPAIR  1982  . APPENDECTOMY    . FOOT SURGERY  2002   due to deformity  . HIP SURGERY  2012  . KNEE ARTHROSCOPY    . LAPAROSCOPIC HYSTERECTOMY  2000  . REPAIR TENDONS LEG     x2    Vitals:   02/24/18 1128  BP: 132/82  Pulse: 71  SpO2: 93%      SUBJECTIVE Chief complaint: Patient presents with dx of CRPS Type 1 and history of falling 10+ times in the past 6 months. Patient states that her CRPS started her issues with balance and also effects her ability to breath. Patient was recently evaluated for a power wheelchair so that she can participate safely in the community, but she is unsure when the chair will be approved by her insurance. Patient lives with her son and his spouse where she has support for ADLs which she cannot perform independently (cooking, dressing, showering) 2/2 to her high levels of pain, decreased balance, and general fatigue. Patient is on oxygen at home, and is hoping to get portable oxygen soon. 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 Onset: 2015 patient broke 5th metatarsal which triggered CRPS. She found out she had osteopenia at the same  time, and began taking Boniva which caused other complications: GERD, hiatal hernia, among others.  Imaging: None Recent changes in overall health/medication: No Directional pattern for falls: None Prior history of physical therapy for balance: Patient has had aquatic therapy in the past. Follow-up appointment with MD: None scheduled Red flags (bowel/bladder changes, saddle paresthesia, personal history of cancer, chills/fever, night sweats, unrelenting pain) Negative  Limitations: Lifting; Standing; Walking; House hold activities; Sitting   How long can you sit comfortably? 10-15 min  How long can you stand comfortably? supported: <5 min  How long can you walk comfortably? w/ RW <5 min, without AD not at all  Patient Stated Goals regain strength, more mobility  Currently in Pain? Yes  Pain Score 6   Pain Location Leg  Pain Orientation Left; Right  Pain Descriptors / Indicators Tightness; Squeezing; Penetrating; Jabbing; Spasm; Sharp  Pain Type Chronic pain  Pain Onset More than a month ago  Pain Frequency Constant  Pain Relieving Factors heat, warm water, pain medication     OBJECTIVE  MUSCULOSKELETAL: Tremor: Absent Bulk: Normal Tone: Normal, no clonus  Posture Patient assumes a slumped posture with forward head and rounded shoulders. Patient repositions frequently in the seat with greater weight shifts to the R side. When needing water during the subjective, patient able to stand up from chair without use of UE and had no loss of balance walking without support/AD  to the sink.   Gait Patient ambulates with a rollator and a shuffling gait, BLE ER, feet heavily pronated with flattened arches.   NEUROLOGICAL:  Mental Status Patient is oriented to person, place and time.  Recent memory is intact.  Remote memory is intact.  Attention span and concentration are intact.  Expressive speech is intact.  Patient's fund of knowledge is within normal limits for educational  level.  Cranial Nerves Visual acuity and visual fields are intact  Extraocular muscles are intact  Facial sensation is intact bilaterally  Facial strength is intact bilaterally  Hearing is normal as tested by gross conversation Palate elevates midline, normal phonation  Shoulder shrug strength is intact  Tongue protrudes midline   Coordination/Cerebellar Finger to Nose: WNL Heel to Shin: WNL Rapid alternating movements: WNL Finger Opposition: WNL Pronator Drift: Negative  FUNCTIONAL OUTCOME MEASURES   Results Comments  BERG /56 Deferred 2/2 to extensive hx and fatigue  DGI /24 Deferred 2/2 to extensive hx and fatigue  TUG seconds Deferred 2/2 to extensive hx and fatigue  5TSTS 48.2 seconds BUE support; decreased use of glutes. Falls risk.    POSTURAL CONTROL TESTS   Clinical Test of Sensory Interaction for Balance    (CTSIB):  CONDITION TIME STRATEGY SWAY  Eyes open, firm surface 30 seconds hip negligible  Eyes closed, firm surface 12 seconds hip Required CGA to maintain balance  Eyes open, foam surface 23 seconds hip Required CGA to maintain balance  Eyes closed, foam surface Unable to test 2/2 to patient fear      ASSESSMENT Clinical Impression: Patient is a 48 year old female presenting to clinic with chief complaints of pain, limited function, and decreased balance 2/2 to CRPS type 1. Patient has an extensive and complex medical history including cardiac, pulmonary, orthopedic, and neurological diagnoses with multiple surgical interventions on BLE and the torso. Patient presents with deficits in strength (5TSTS: 48.2 sec), balance (increased reliance on visual and somatosensory systems for sustained standing), activity tolerance (< 5min), pain (6/10 current), decreased endurance (2/2 to COPD/asthma). Patient's progress may be limited due to financial barriers, seemingly strong identity attachment to various diagnoses, and comorbidities. Patient will benefit from skilled  therapeutic intervention to address the aforementioned deficits and progress activity tolerance for decreased risk of falls, increased function, participation in the community, and improved overall QOL.  High (unstable): 3 or more personal factors/comorbidities, 4 or more body systems/activity limitations/participation restrictions    PLAN Next Visit: gentle BLE strengthening as tolerated, further balance assessment as tolerated HEP: none provided at this time   Objective measurements completed on examination: See above findings.     PT Long Term Goals - 02/24/18 1116      PT LONG TERM GOAL #1   Title  Patient will demonstrate improved function as evidenced by a FOTO score equal to or greater than 56/100 for improved safety and QOL.    Baseline  IE: 48/100    Time  4    Period  Weeks    Status  New    Target Date  03/24/18      PT LONG TERM GOAL #2   Title  Patient will decrease 5TSTS by at least 3 seconds in order to demonstrate clinically significant improvement in LE strength.    Baseline  IE: 48.2s with BUE support    Time  4    Period  Weeks    Status  New    Target Date  03/24/18  PT LONG TERM GOAL #3   Title  Pt will be independent with HEP in order to improve strength and balance in order to decrease fall risk and improve function at home and work.     Baseline  IE: not initiated    Time  4    Period  Weeks    Status  New    Target Date  03/24/18      PT LONG TERM GOAL #4   Title  Patient will demonstrate improved balance as evidenced by her ability to stand unsupported for >8 min to allow for safe showering/bathing with improved independence.    Baseline  IE: 2:18/ requires supervision for showering at home    Time  4    Period  Weeks    Status  New    Target Date  03/24/18              Patient will benefit from skilled therapeutic intervention in order to improve the following deficits and impairments:  Abnormal gait, Impaired sensation, Improper  body mechanics, Pain, Postural dysfunction, Increased muscle spasms, Decreased mobility, Decreased coordination, Decreased activity tolerance, Decreased endurance, Cardiopulmonary status limiting activity, Decreased range of motion, Decreased strength, Hypomobility, Obesity, Difficulty walking, Decreased balance  Visit Diagnosis: Pain in both lower extremities  History of falling  Muscle weakness (generalized)  Difficulty in walking, not elsewhere classified  Complex regional pain syndrome type 1 of both lower extremities     Problem List There are no active problems to display for this patient.   Kathryne ErikssonKatlin E Harker 02/26/2018, 11:12 AM  [facility] [address] [place], [place], [address] Phone: [phone]   Fax:  [phone]  Name: Katie Neal MRN: [id number] Date of Birth: 1970/02/13

## 2018-02-27 ENCOUNTER — Encounter: Payer: Self-pay | Admitting: Physical Therapy

## 2018-02-27 ENCOUNTER — Ambulatory Visit: Payer: Medicare Other | Admitting: Physical Therapy

## 2018-02-27 DIAGNOSIS — M79605 Pain in left leg: Secondary | ICD-10-CM

## 2018-02-27 DIAGNOSIS — M79604 Pain in right leg: Secondary | ICD-10-CM

## 2018-02-27 DIAGNOSIS — R262 Difficulty in walking, not elsewhere classified: Secondary | ICD-10-CM

## 2018-02-27 DIAGNOSIS — M6281 Muscle weakness (generalized): Secondary | ICD-10-CM

## 2018-02-27 DIAGNOSIS — G90523 Complex regional pain syndrome I of lower limb, bilateral: Secondary | ICD-10-CM

## 2018-02-27 DIAGNOSIS — Z9181 History of falling: Secondary | ICD-10-CM

## 2018-02-27 NOTE — Patient Instructions (Signed)
Access Code: [email or web address]  URL: [email or web address]  Date: 02/27/2018  Prepared by: Dorene Grebe   Exercises  Supine Single Leg Ankle Pumps - 10 reps - 1 sets - 1x daily - 7x weekly  Supine Short Arc Quad - 10 reps - 1 sets - 1x daily - 7x weekly  Seated Single Leg March with Weight Shift - 10 reps - 1 sets - 1x daily - 7x weekly  Seated Heel Raise - 10 reps - 1 sets - 1x daily - 7x weekly

## 2018-02-27 NOTE — Therapy (Signed)
[facility] [address] [place], [name], [address] Phone: [phone]   Fax:  [phone]  Physical Therapy Treatment  Patient Details  Name: Katie Neal MRN: [id number] Date of Birth: [birth year] Referring Provider (PT): Albertina Parr PA-C   Encounter Date: 02/27/2018  PT End of Session - 02/27/18 1117    Visit Number  2    Number of Visits  9    Date for PT Re-Evaluation  03/24/18    Authorization Type  2/10 (evaluation 02/24/2018)    PT Start Time  1114    PT Stop Time  1151    PT Time Calculation (min)  37 min    Equipment Utilized During Treatment  Gait belt    Activity Tolerance  Patient limited by pain;Patient limited by fatigue    Behavior During Therapy  [facility] for tasks assessed/performed;Restless       Past Medical History:  Diagnosis Date  . Arthritis   . Asthma   . CRPS (complex regional pain syndrome type I)   . Depression   . IBS (irritable bowel syndrome)   . Insomnia   . PTSD (post-traumatic stress disorder)     Past Surgical History:  Procedure Laterality Date  . ABDOMINAL HYSTERECTOMY    . ACHILLES TENDON REPAIR  1982  . APPENDECTOMY    . FOOT SURGERY  2002   due to deformity  . HIP SURGERY  2012  . KNEE ARTHROSCOPY    . LAPAROSCOPIC HYSTERECTOMY  2000  . REPAIR TENDONS LEG     x2    There were no vitals filed for this visit.  Subjective Assessment - 02/27/18 1115    Subjective  Pt. reports some soreness in B LE and lower back from Monday's eval. Pt. ambulates into clinic with rollator and flip flops (unable to wear closed-toe shoes/ socks).     Pertinent History  Onset: 2015 patient broke 5th metatarsal which triggered CRPS. She found out she had osteopenia at the same time, and began taking Boniva which caused other complications: GERD, hiatal hernia, among others.     Limitations  Lifting;Standing;Walking;House hold activities;Sitting    How long can you sit comfortably?  10-15 min     How long can you stand comfortably?  supported: <5 min    How long can you walk comfortably?  w/ RW <5 min, without AD not at all    Patient Stated Goals  Regain strength, more mobility    Currently in Pain?  Yes    Pain Score  6     Pain Location  Leg    Pain Orientation  Right;Left    Pain Type  Chronic pain    Pain Onset  More than a month ago    Pain Frequency  Constant        Treatment:  Seated B high marches 2x10 Seated B LAQ 2x10 Seated heel raises/ toe raises 10x Seated adduction ball squeezes 4x (stopped due to incr. Pain) Sit to stand 5x Ambulating with rollator in clinic 1 lap  Reviewed HEP   PT Long Term Goals - 02/24/18 1116      PT LONG TERM GOAL #1   Title  Patient will demonstrate improved function as evidenced by a FOTO score equal to or greater than 56/100 for improved safety and QOL.    Baseline  IE: 48/100    Time  4    Period  Weeks    Status  New  Target Date  03/24/18      PT LONG TERM GOAL #2   Title  Patient will decrease 5TSTS by at least 3 seconds in order to demonstrate clinically significant improvement in LE strength.    Baseline  IE: 48.2s with BUE support    Time  4    Period  Weeks    Status  New    Target Date  03/24/18      PT LONG TERM GOAL #3   Title  Pt will be independent with HEP in order to improve strength and balance in order to decrease fall risk and improve function at home and work.     Baseline  IE: not initiated    Time  4    Period  Weeks    Status  New    Target Date  03/24/18      PT LONG TERM GOAL #4   Title  Patient will demonstrate improved balance as evidenced by her ability to stand unsupported for >8 min to allow for safe showering/bathing with improved independence.    Baseline  IE: 2:18/ requires supervision for showering at home    Time  4    Period  Weeks    Status  New    Target Date  03/24/18         Plan - 02/27/18 1117    Clinical Impression Statement  Pt. ambulates into clinic with  rollator and forward posture. Pt. was able to tolerate seated therex without weights with multiple rest breaks. PT educated pt. on seated HEP and breaking her therex up during the day so she does not overwork herself. Pt. articulated that she can only tolerate seated and supine therex for now (see handouts for HEP). Pt. Was unable to tolerate adduction ball squeezes after 4 reps due to incr. Pain. Pt. was able to complete 5 sit to stands but used heavy B UE assist. Pt. was also able to complete one lap of ambulating with rollator in clinic but fatigue was noted at end of session. Pt. will continue to benefit from skilled PT services in order to improve B LE strengthening to increase standing/ walking tolerance for ADLs.      Clinical Presentation  Unstable    Clinical Decision Making  High    Rehab Potential  Fair    PT Frequency  2x / week    PT Duration  4 weeks    PT Treatment/Interventions  ADLs/Self Care Home Management;Aquatic Therapy;Cryotherapy;Moist Heat;Electrical Stimulation;Gait training;Stair training;Functional mobility training;Therapeutic activities;Therapeutic exercise;Balance training;Neuromuscular re-education;Patient/family education;Wheelchair mobility training;Manual techniques;Energy conservation;Taping    PT Next Visit Plan  assess balance further as tolerated; gentle strengthening of BLE    PT Home Exercise Plan  none provided at this time    Consulted and Agree with Plan of Care  Patient       Patient will benefit from skilled therapeutic intervention in order to improve the following deficits and impairments:  Abnormal gait, Impaired sensation, Improper body mechanics, Pain, Postural dysfunction, Increased muscle spasms, Decreased mobility, Decreased coordination, Decreased activity tolerance, Decreased endurance, Cardiopulmonary status limiting activity, Decreased range of motion, Decreased strength, Hypomobility, Obesity, Difficulty walking, Decreased balance  Visit  Diagnosis: Complex regional pain syndrome type 1 of both lower extremities  Pain in both lower extremities  History of falling  Muscle weakness (generalized)  Difficulty in walking, not elsewhere classified     Problem List There are no active problems to display for this patient.  Kayleen MemosMichael C  Christel MormonSherk, PT, DPT # [id number] Levander CampionAshlyn Herold Smith, SPT  This entire session was performed under direct supervision and direction of a licensed therapist/therapist assistant. I have personally read, edited and approve of the note as written.  02/27/2018, 2:59 PM  [facility] [address] [place], [place], [address] Phone: [phone]   Fax:  [phone]  Name: Katie Neal MRN: [id number] Date of Birth: Aug 24, 1970

## 2018-02-27 NOTE — Addendum Note (Signed)
Addended by: Kathryne Eriksson on: 02/27/2018 02:10 PM   Modules accepted: Orders

## 2018-03-04 ENCOUNTER — Encounter: Payer: Self-pay | Admitting: Physical Therapy

## 2018-03-04 ENCOUNTER — Ambulatory Visit: Payer: Medicare Other | Admitting: Physical Therapy

## 2018-03-04 DIAGNOSIS — R262 Difficulty in walking, not elsewhere classified: Secondary | ICD-10-CM

## 2018-03-04 DIAGNOSIS — M79605 Pain in left leg: Secondary | ICD-10-CM

## 2018-03-04 DIAGNOSIS — M79604 Pain in right leg: Secondary | ICD-10-CM | POA: Diagnosis not present

## 2018-03-04 DIAGNOSIS — M6281 Muscle weakness (generalized): Secondary | ICD-10-CM

## 2018-03-04 DIAGNOSIS — Z9181 History of falling: Secondary | ICD-10-CM

## 2018-03-04 DIAGNOSIS — G90523 Complex regional pain syndrome I of lower limb, bilateral: Secondary | ICD-10-CM

## 2018-03-04 NOTE — Therapy (Signed)
[facility] [address] [place], [name], [address] Phone: [phone]   Fax:  [phone]  Physical Therapy Treatment  Patient Details  Name: Katie Neal MRN: [id number] Date of Birth: 12/30/1970 Referring Provider (PT): Albertina Parr PA-C   Encounter Date: 03/04/2018  PT End of Session - 03/04/18 1032    Visit Number  3    Number of Visits  9    Date for PT Re-Evaluation  03/24/18    Authorization Type  3/10 (evaluation 02/24/2018)    PT Start Time  1027    PT Stop Time  1112    PT Time Calculation (min)  45 min    Equipment Utilized During Treatment  Gait belt    Activity Tolerance  Patient limited by pain;Patient limited by fatigue    Behavior During Therapy  [facility] for tasks assessed/performed;Restless       Past Medical History:  Diagnosis Date  . Arthritis   . Asthma   . CRPS (complex regional pain syndrome type I)   . Depression   . IBS (irritable bowel syndrome)   . Insomnia   . PTSD (post-traumatic stress disorder)     Past Surgical History:  Procedure Laterality Date  . ABDOMINAL HYSTERECTOMY    . ACHILLES TENDON REPAIR  1982  . APPENDECTOMY    . FOOT SURGERY  2002   due to deformity  . HIP SURGERY  2012  . KNEE ARTHROSCOPY    . LAPAROSCOPIC HYSTERECTOMY  2000  . REPAIR TENDONS LEG     x2    There were no vitals filed for this visit.  Subjective Assessment - 03/04/18 1027    Subjective  Pt. states 6/10 overall pain today and was able to complete HEP 1-2x/ day. Pt. denies any falls and was sedentary over the weekend.    Pertinent History  Onset: 2015 patient broke 5th metatarsal which triggered CRPS. She found out she had osteopenia at the same time, and began taking Boniva which caused other complications: GERD, hiatal hernia, among others.     Limitations  Lifting;Standing;Walking;House hold activities;Sitting    How long can you sit comfortably?  10-15 min    How long can you stand  comfortably?  supported: <5 min    How long can you walk comfortably?  w/ RW <5 min, without AD not at all    Patient Stated Goals  Regain strength, more mobility    Currently in Pain?  Yes    Pain Score  6     Pain Location  Leg    Pain Orientation  Right;Left    Pain Type  Chronic pain       Treatment  Therex:  NuStep Seat 10 L5 Seated ball adduction squeezes 2x10 Fwd/bkwd walking in //-bars with high marching 3 laps Seated HS curls YTB B 2x15 Seated abduction with no resistance 2x15 Side stepping in //-bars 3 laps Tandem balance 4x 16 sec Standing calf raise/ toe raises 20x   PT Long Term Goals - 02/24/18 1116      PT LONG TERM GOAL #1   Title  Patient will demonstrate improved function as evidenced by a FOTO score equal to or greater than 56/100 for improved safety and QOL.    Baseline  IE: 48/100    Time  4    Period  Weeks    Status  New    Target Date  03/24/18      PT LONG TERM  GOAL #2   Title  Patient will decrease 5TSTS by at least 3 seconds in order to demonstrate clinically significant improvement in LE strength.    Baseline  IE: 48.2s with BUE support    Time  4    Period  Weeks    Status  New    Target Date  03/24/18      PT LONG TERM GOAL #3   Title  Pt will be independent with HEP in order to improve strength and balance in order to decrease fall risk and improve function at home and work.     Baseline  IE: not initiated    Time  4    Period  Weeks    Status  New    Target Date  03/24/18      PT LONG TERM GOAL #4   Title  Patient will demonstrate improved balance as evidenced by her ability to stand unsupported for >8 min to allow for safe showering/bathing with improved independence.    Baseline  IE: 2:18/ requires supervision for showering at home    Time  4    Period  Weeks    Status  New    Target Date  03/24/18        Patient will benefit from skilled therapeutic intervention in order to improve the following deficits and impairments:   Abnormal gait, Impaired sensation, Improper body mechanics, Pain, Postural dysfunction, Increased muscle spasms, Decreased mobility, Decreased coordination, Decreased activity tolerance, Decreased endurance, Cardiopulmonary status limiting activity, Decreased range of motion, Decreased strength, Hypomobility, Obesity, Difficulty walking, Decreased balance  Visit Diagnosis: Complex regional pain syndrome type 1 of both lower extremities  Pain in both lower extremities  History of falling  Muscle weakness (generalized)  Difficulty in walking, not elsewhere classified     Problem List There are no active problems to display for this patient.  Cammie Mcgee, PT, DPT # [address] [place], SPT  03/04/2018, 2:43 PM  [facility] [address] [place], [name], [address] Phone: [phone]   Fax:  [phone]  Name: Katie Neal MRN: [id number] Date of Birth: 03/10/1970

## 2018-03-06 ENCOUNTER — Ambulatory Visit: Payer: Medicare Other | Admitting: Physical Therapy

## 2018-03-11 ENCOUNTER — Ambulatory Visit: Payer: Medicare Other | Admitting: Physical Therapy

## 2018-03-11 ENCOUNTER — Encounter: Payer: Self-pay | Admitting: Physical Therapy

## 2018-03-11 DIAGNOSIS — G90523 Complex regional pain syndrome I of lower limb, bilateral: Secondary | ICD-10-CM

## 2018-03-11 DIAGNOSIS — M79605 Pain in left leg: Secondary | ICD-10-CM

## 2018-03-11 DIAGNOSIS — Z9181 History of falling: Secondary | ICD-10-CM

## 2018-03-11 DIAGNOSIS — M79604 Pain in right leg: Secondary | ICD-10-CM

## 2018-03-11 DIAGNOSIS — R262 Difficulty in walking, not elsewhere classified: Secondary | ICD-10-CM

## 2018-03-11 DIAGNOSIS — M6281 Muscle weakness (generalized): Secondary | ICD-10-CM

## 2018-03-11 NOTE — Therapy (Signed)
[facility] [address] [place], [name], [address] Phone: [phone]   Fax:  [phone]  Physical Therapy Treatment  Patient Details  Name: Katie Neal MRN: [id number] Date of Birth: Jun 13, 1970 Referring Provider (PT): Albertina Parr PA-C   Encounter Date: 03/11/2018  PT End of Session - 03/11/18 1308    Visit Number  4    Number of Visits  9    Date for PT Re-Evaluation  03/24/18    Authorization Type  4/10 (evaluation 02/24/2018)    PT Start Time  1127    PT Stop Time  1213    PT Time Calculation (min)  46 min    Equipment Utilized During Treatment  Gait belt    Activity Tolerance  Patient limited by pain;Patient limited by fatigue    Behavior During Therapy  [facility] for tasks assessed/performed;Restless       Past Medical History:  Diagnosis Date  . Arthritis   . Asthma   . CRPS (complex regional pain syndrome type I)   . Depression   . IBS (irritable bowel syndrome)   . Insomnia   . PTSD (post-traumatic stress disorder)     Past Surgical History:  Procedure Laterality Date  . ABDOMINAL HYSTERECTOMY    . ACHILLES TENDON REPAIR  1982  . APPENDECTOMY    . FOOT SURGERY  2002   due to deformity  . HIP SURGERY  2012  . KNEE ARTHROSCOPY    . LAPAROSCOPIC HYSTERECTOMY  2000  . REPAIR TENDONS LEG     x2    There were no vitals filed for this visit.  Subjective Assessment - 03/11/18 1304    Subjective  Pt. states she fell last Wednesday and missed her PT appt. on Thursday. Pt. states that she was trying to get off of her toilet and fell forward onto her L knee in which her ankle "went sideways" and she also hit her head. Pt. denies any dizziness/HA since fall. Pt. states her pain went up to 8-9/10 NPS after the fall and her pain is "not higher than normal today." Pt. reports 6/10 pain in her L knee today and 5/10 "everywhere else."    Pertinent History  Onset: 2015 patient broke 5th metatarsal which triggered  CRPS. She found out she had osteopenia at the same time, and began taking Boniva which caused other complications: GERD, hiatal hernia, among others.     Limitations  Lifting;Standing;Walking;House hold activities;Sitting    How long can you sit comfortably?  10-15 min    How long can you stand comfortably?  supported: <5 min    How long can you walk comfortably?  w/ RW <5 min, without AD not at all    Patient Stated Goals  Regain strength, more mobility    Currently in Pain?  Yes    Pain Score  6     Pain Location  Knee    Pain Orientation  Left    Pain Descriptors / Indicators  Aching    Pain Type  Chronic pain       Treatment  Therex:  NuStep Seat 10 L5 10 min (warm up) Seated ball adduction squeezes 2x10 Seated HS curls RTB B 2x15 Seated abduction with RTB 2x15 Seated LAQ with adduction ball squeeze 20x B Supine SAQ B 15x Supine bridges with TrA activation 2x10 Supine marching with TrA activation 20x Seated trunk rotation and chest press outs with RTB 15x each Fwd/bkwd walking in //-bars  with high marching 3 laps Standing B hip abduction/ ext 15x each (c/o SI joint pain but was able to complete) Attempted sit to stand with no UE assist and then 1 UE assist but stopped due to incr. B knee pain     PT Long Term Goals - 02/24/18 1116      PT LONG TERM GOAL #1   Title  Patient will demonstrate improved function as evidenced by a FOTO score equal to or greater than 56/100 for improved safety and QOL.    Baseline  IE: 48/100    Time  4    Period  Weeks    Status  New    Target Date  03/24/18      PT LONG TERM GOAL #2   Title  Patient will decrease 5TSTS by at least 3 seconds in order to demonstrate clinically significant improvement in LE strength.    Baseline  IE: 48.2s with BUE support    Time  4    Period  Weeks    Status  New    Target Date  03/24/18      PT LONG TERM GOAL #3   Title  Pt will be independent with HEP in order to improve strength and balance in  order to decrease fall risk and improve function at home and work.     Baseline  IE: not initiated    Time  4    Period  Weeks    Status  New    Target Date  03/24/18      PT LONG TERM GOAL #4   Title  Patient will demonstrate improved balance as evidenced by her ability to stand unsupported for >8 min to allow for safe showering/bathing with improved independence.    Baseline  IE: 2:18/ requires supervision for showering at home    Time  4    Period  Weeks    Status  New    Target Date  03/24/18          Pt. was able to complete 10 minutes on NuStep without incr. in B knee pain. Pt. demonstrates decr. heel strike when ambulating with RW and with no AD in clinic (min CGA assist from PT) and is unable to correct after VCs from PT due to incr. Achilles pain and pt. states "it is hard for her to focus on putting her heel down first" when ambulating. Pt. was able to tolerate all seated and supine therex without incr. B knee pain but during standing hip therex, pt. c/o of SI joint pain but was able to complete the exercise, and then requested a rest break. Pt. attempted sit to stands with no UE assist and then 1 UE assist but stopped due to incr. knee pain and stated "I don't think I can do anymore therapy today." Pt. will continue to benefit from skilled PT services in order to incr. B LE strength/ endurance and improve balance/ gait pattern in order to safely ambulate and complete ADLs with less pain.    Patient will benefit from skilled therapeutic intervention in order to improve the following deficits and impairments:  Abnormal gait, Impaired sensation, Improper body mechanics, Pain, Postural dysfunction, Increased muscle spasms, Decreased mobility, Decreased coordination, Decreased activity tolerance, Decreased endurance, Cardiopulmonary status limiting activity, Decreased range of motion, Decreased strength, Hypomobility, Obesity, Difficulty walking, Decreased balance  Visit  Diagnosis: Complex regional pain syndrome type 1 of both lower extremities  Pain in both lower extremities  History  of falling  Muscle weakness (generalized)  Difficulty in walking, not elsewhere classified     Problem List There are no active problems to display for this patient.  Cammie McgeeMichael C Sherk, PT, DPT # [address] M[address] LevanSmith, SPT 03/11/2018, 1:48 PM  [facility] [address] [place], [place], [id number] Phone: [phone]   Fax:  [phone]  Name: Sallee LangeMichelle L Haslem MRN: [id number] Date of Birth: 1970-11-15

## 2018-03-13 ENCOUNTER — Ambulatory Visit: Payer: Medicare Other | Admitting: Physical Therapy

## 2018-03-13 ENCOUNTER — Encounter: Payer: Self-pay | Admitting: Physical Therapy

## 2018-03-13 DIAGNOSIS — R262 Difficulty in walking, not elsewhere classified: Secondary | ICD-10-CM

## 2018-03-13 DIAGNOSIS — M79605 Pain in left leg: Secondary | ICD-10-CM

## 2018-03-13 DIAGNOSIS — Z9181 History of falling: Secondary | ICD-10-CM

## 2018-03-13 DIAGNOSIS — G90523 Complex regional pain syndrome I of lower limb, bilateral: Secondary | ICD-10-CM

## 2018-03-13 DIAGNOSIS — M79604 Pain in right leg: Secondary | ICD-10-CM | POA: Diagnosis not present

## 2018-03-13 DIAGNOSIS — M6281 Muscle weakness (generalized): Secondary | ICD-10-CM

## 2018-03-13 NOTE — Patient Instructions (Signed)
Access Code: [email or web address]  URL: [email or web address]  Date: 03/13/2018  Prepared by: Dorene Grebe   Exercises  Standing Hip Abduction with Unilateral Counter Support - 10 reps - 2 sets - 1x daily - 7x weekly  Standing Hip Extension with Counter Support - 10 reps - 2 sets - 1x daily - 7x weekly

## 2018-03-13 NOTE — Therapy (Signed)
[place] [facility] [address] [place], [name], [address] Phone: [phone]   Fax:  [phone]  Physical Therapy Treatment  Patient Details  Name: Katie Neal MRN: [id number] Date of Birth: 06/09/1970 Referring Provider (PT): Albertina Parr PA-C   Encounter Date: 03/13/2018  PT End of Session - 03/13/18 1225    Visit Number  5    Number of Visits  9    Date for PT Re-Evaluation  03/24/18    Authorization Type  5/10 (evaluation 02/24/2018)    PT Start Time  1126    PT Stop Time  1209    PT Time Calculation (min)  43 min    Equipment Utilized During Treatment  Gait belt    Activity Tolerance  Patient limited by pain;Patient limited by fatigue    Behavior During Therapy  [facility] for tasks assessed/performed;Restless       Past Medical History:  Diagnosis Date  . Arthritis   . Asthma   . CRPS (complex regional pain syndrome type I)   . Depression   . IBS (irritable bowel syndrome)   . Insomnia   . PTSD (post-traumatic stress disorder)     Past Surgical History:  Procedure Laterality Date  . ABDOMINAL HYSTERECTOMY    . ACHILLES TENDON REPAIR  1982  . APPENDECTOMY    . FOOT SURGERY  2002   due to deformity  . HIP SURGERY  2012  . KNEE ARTHROSCOPY    . LAPAROSCOPIC HYSTERECTOMY  2000  . REPAIR TENDONS LEG     x2    There were no vitals filed for this visit.  Subjective Assessment - 03/13/18 1221    Subjective  Pt. reports she was tired after Tuesdays session from incr. activity in therapy as well as running errands and MD appts. Pt. states she saw her pain management MD (no new changes) and a mouth specialist in which she will be having mouth surgery next week. Pt. reports muscle soreness today but "no CRPS pain."    Pertinent History  Onset: 2015 patient broke 5th metatarsal which triggered CRPS. She found out she had osteopenia at the same time, and began taking Boniva which caused other complications: GERD, hiatal  hernia, among others.     Limitations  Lifting;Standing;Walking;House hold activities;Sitting    How long can you sit comfortably?  10-15 min    How long can you stand comfortably?  supported: <5 min    How long can you walk comfortably?  w/ RW <5 min, without AD not at all    Patient Stated Goals  Regain strength, more mobility    Currently in Pain?  Yes    Pain Score  6     Pain Location  Knee    Pain Orientation  Left    Pain Descriptors / Indicators  Aching    Pain Onset  More than a month ago         Treatment  Therex:  NuStep Seat 10 L5 10 min (warm up) Seated ball adduction squeezes 2x10 Seated HS curls RTB B 2x15 Seated abduction with RTB 2x15 Fwd/bkwd walking in //-bars with high marching 5 laps Side step in //-bars 2 laps Standing calf raises 2x15 Standing B hip abduction/ ext 15x each (added to HEP) Tandem balance 2x20 sec each Sit to stand with no UE assist 5x with 1 Airex pad (min CGA) Attempted LAQ with slight manual resistance from PT but stopped due to incr. Pain and  sensitivity from pt    PT Long Term Goals - 02/24/18 1116      PT LONG TERM GOAL #1   Title  Patient will demonstrate improved function as evidenced by a FOTO score equal to or greater than 56/100 for improved safety and QOL.    Baseline  IE: 48/100    Time  4    Period  Weeks    Status  New    Target Date  03/24/18      PT LONG TERM GOAL #2   Title  Patient will decrease 5TSTS by at least 3 seconds in order to demonstrate clinically significant improvement in LE strength.    Baseline  IE: 48.2s with BUE support    Time  4    Period  Weeks    Status  New    Target Date  03/24/18      PT LONG TERM GOAL #3   Title  Pt will be independent with HEP in order to improve strength and balance in order to decrease fall risk and improve function at home and work.     Baseline  IE: not initiated    Time  4    Period  Weeks    Status  New    Target Date  03/24/18      PT LONG TERM GOAL  #4   Title  Patient will demonstrate improved balance as evidenced by her ability to stand unsupported for >8 min to allow for safe showering/bathing with improved independence.    Baseline  IE: 2:18/ requires supervision for showering at home    Time  4    Period  Weeks    Status  New    Target Date  03/24/18         Plan - 03/13/18 1235    Clinical Impression Statement  Pt. ambulates into clinic using rollator and demonstrates decr. hip ext. and heel strike when walking. Pt. needs VCs to initiate heel strike and was able to tolerate high marching with exaggerated  heel strike better today compared to last tx. Pt. is able to maintain upright posture when performing standing hip therex and was issued in HEP. Pt. needs min CGA when transitioning from sit to stand without UE assist. Pt. also c/o incr. pain and sensitivity when PT applied light manual resistance to seated LAQ. Pt. will continue to benefit from skilled PT services in order to incr. B LE strength/ endurance to be able to ambulate with normal gait pattern and perform ADLs to improve overall QOL.     Clinical Presentation  Unstable    Clinical Decision Making  High    PT Frequency  2x / week    PT Duration  4 weeks    PT Treatment/Interventions  ADLs/Self Care Home Management;Aquatic Therapy;Cryotherapy;Moist Heat;Electrical Stimulation;Gait training;Stair training;Functional mobility training;Therapeutic activities;Therapeutic exercise;Balance training;Neuromuscular re-education;Patient/family education;Wheelchair mobility training;Manual techniques;Energy conservation;Taping    PT Next Visit Plan  gait training and balance, standing exercises as tolerated    PT Home Exercise Plan  see handouts    Consulted and Agree with Plan of Care  Patient       Patient will benefit from skilled therapeutic intervention in order to improve the following deficits and impairments:  Abnormal gait, Impaired sensation, Improper body mechanics,  Pain, Postural dysfunction, Increased muscle spasms, Decreased mobility, Decreased coordination, Decreased activity tolerance, Decreased endurance, Cardiopulmonary status limiting activity, Decreased range of motion, Decreased strength, Hypomobility, Obesity, Difficulty walking, Decreased balance  Visit  Diagnosis: Complex regional pain syndrome type 1 of both lower extremities  Pain in both lower extremities  History of falling  Muscle weakness (generalized)  Difficulty in walking, not elsewhere classified     Problem List There are no active problems to display for this patient.  Cammie Mcgee, PT, DPT # [id number] [place], SPT 03/13/2018, 12:41 PM  [facility] [address] [place], [name], [address] Phone: [phone]   Fax:  [phone]  Name: Katie Neal MRN: [id number] Date of Birth: November 27, 1970

## 2018-03-18 ENCOUNTER — Ambulatory Visit: Payer: Medicare Other | Admitting: Physical Therapy

## 2018-03-20 ENCOUNTER — Encounter: Payer: Medicare Other | Admitting: Physical Therapy

## 2018-03-31 ENCOUNTER — Ambulatory Visit: Payer: Medicare Other | Admitting: Physical Therapy

## 2018-04-02 ENCOUNTER — Ambulatory Visit: Payer: Medicare Other | Attending: Physician Assistant | Admitting: Physical Therapy

## 2018-04-02 DIAGNOSIS — Z9181 History of falling: Secondary | ICD-10-CM | POA: Insufficient documentation

## 2018-04-02 DIAGNOSIS — M79604 Pain in right leg: Secondary | ICD-10-CM | POA: Insufficient documentation

## 2018-04-02 DIAGNOSIS — M6281 Muscle weakness (generalized): Secondary | ICD-10-CM | POA: Insufficient documentation

## 2018-04-02 DIAGNOSIS — M79605 Pain in left leg: Secondary | ICD-10-CM | POA: Insufficient documentation

## 2018-04-02 DIAGNOSIS — G90523 Complex regional pain syndrome I of lower limb, bilateral: Secondary | ICD-10-CM | POA: Insufficient documentation

## 2018-04-02 DIAGNOSIS — R262 Difficulty in walking, not elsewhere classified: Secondary | ICD-10-CM | POA: Insufficient documentation

## 2018-04-07 ENCOUNTER — Ambulatory Visit: Payer: Medicare Other | Admitting: Physical Therapy

## 2018-04-07 ENCOUNTER — Other Ambulatory Visit: Payer: Self-pay

## 2018-04-07 ENCOUNTER — Encounter: Payer: Self-pay | Admitting: Physical Therapy

## 2018-04-07 DIAGNOSIS — M6281 Muscle weakness (generalized): Secondary | ICD-10-CM | POA: Diagnosis present

## 2018-04-07 DIAGNOSIS — R262 Difficulty in walking, not elsewhere classified: Secondary | ICD-10-CM | POA: Diagnosis present

## 2018-04-07 DIAGNOSIS — G90523 Complex regional pain syndrome I of lower limb, bilateral: Secondary | ICD-10-CM

## 2018-04-07 DIAGNOSIS — Z9181 History of falling: Secondary | ICD-10-CM | POA: Diagnosis present

## 2018-04-07 DIAGNOSIS — M79604 Pain in right leg: Secondary | ICD-10-CM | POA: Diagnosis present

## 2018-04-07 DIAGNOSIS — M79605 Pain in left leg: Secondary | ICD-10-CM | POA: Diagnosis present

## 2018-04-07 NOTE — Therapy (Signed)
[facility] [address] [place], [place], [address] Phone: [phone]   Fax:  [phone]  Physical Therapy Treatment Physical Therapy Progress Note   Dates of reporting period  02/24/2018   to   04/07/2018  Patient Details  Name: Katie Neal MRN: [id number] Date of Birth: 1970/08/22 Referring Provider (PT): Hessie Dibble PA-C   Encounter Date: 04/07/2018  Treatment: 6 out of 14.  Recert date: 4/00/8676 1033 to 1135   Past Medical History:  Diagnosis Date  . Arthritis   . Asthma   . CRPS (complex regional pain syndrome type I)   . Depression   . IBS (irritable bowel syndrome)   . Insomnia   . PTSD (post-traumatic stress disorder)     Past Surgical History:  Procedure Laterality Date  . ABDOMINAL HYSTERECTOMY    . [place]  . APPENDECTOMY    . FOOT SURGERY  2002   due to deformity  . HIP SURGERY  2012  . KNEE ARTHROSCOPY    . LAPAROSCOPIC HYSTERECTOMY  2000  . REPAIR TENDONS LEG     x2    There were no vitals filed for this visit.      [facility] PT Assessment - 04/09/18 0001      Assessment   Medical Diagnosis  CRPS Type 1    Referring Provider (PT)  Hessie Dibble PA-C    Onset Date/Surgical Date  01/24/13    Hand Dominance  Right    Prior Therapy  Yes; aquatic. Patient really enjoyed.      Precautions   Precautions  Fall      Balance Screen   Has the patient fallen in the past 6 months  Yes      [place] residence      Prior Function   Level of Independence  Independent        FOTO: initial 28/ today 32/ goal 37 (slight improvement).     There.ex.:  Walking in PT clinic with no UE assist Forward/backwards/lateral walking in //-bars with no to light UE assist 5x (mirror feedback) Seated hip adduction 10x 10 seconds.  Seated marching/ alt. UE and LE 20x each.    Standing hip abduction/ heel raises/ hamstring curls 20x.    Nustep L4 5 min.  (increase L foot cramp).    Neuro.mm.:  Sit to standing:  42.78  (increase R knee pain)- O2 sat: 94%. Turning CW/ CCW to L and R with no UE assist Standing posture correction/ wt. Shifting.     PT Long Term Goals - 04/09/18 1042      PT LONG TERM GOAL #1   Title  Patient will demonstrate improved function as evidenced by a FOTO score equal to or greater than 37/100 for improved safety and QOL.    Baseline  IE: 28/100.  FOTO: initial 28/ today 32/ goal 37 (slight improvement).      Time  4    Period  Weeks    Status  Not Met    Target Date  05/05/18      PT LONG TERM GOAL #2   Title  Patient will decrease 5TSTS by at least 3 seconds in order to demonstrate clinically significant improvement in LE strength.    Baseline  IE: 48.2s with BUE support.  3/16: 42.1 seconds     Time  4    Period  Weeks    Status  Achieved    Target Date  05/05/18      PT LONG TERM GOAL #3   Title  Pt will be independent with HEP in order to improve strength and balance in order to decrease fall risk and improve function at home and work.     Baseline  IE: not initiated.  3/16: pt. has HEP but limited compliance    Time  4    Period  Weeks    Status  Not Met    Target Date  05/05/18      PT LONG TERM GOAL #4   Title  Patient will demonstrate improved balance as evidenced by her ability to stand unsupported for >8 min to allow for safe showering/bathing with improved independence.    Baseline  IE: 2:18/ requires supervision for showering at home.  3/16: 2 recent falls at home (posterior)    Time  4    Period  Weeks    Status  Not Met    Target Date  05/05/18         Plan - 04/09/18 1038    Clinical Impression Statement  Pt. ambulates into clinic using rollator and demonstrates decr. hip ext. and heel strike when walking.  Pt. reports 2 recent falls backwards and c/o increase B knee pain (5/10).  Pt. unsure of why she fell but states it may be related to vertigo.  Pt. needs  VCs to initiate heel strike and was able to tolerate high marching with exaggerated heel strike better today compared to last several tx. sessions.  Pt. is able to maintain upright posture when performing standing tolerance/ hip therex.  Pt. needs min CGA when transitioning from sit to stand without UE assist. Pt. also c/o incr. pain and sensitivity when PT applied light manual resistance to seated LAQ/ hip flexors.  Pt. will continue to benefit from skilled PT services in order to incr. B LE strength/ endurance to be able to ambulate with normal gait pattern and perform ADLs to improve overall QOL.     Stability/Clinical Decision Making  Unstable/Unpredictable    Clinical Decision Making  High    Rehab Potential  Fair    PT Frequency  2x / week    PT Duration  4 weeks    PT Treatment/Interventions  ADLs/Self Care Home Management;Aquatic Therapy;Cryotherapy;Moist Heat;Electrical Stimulation;Gait training;Stair training;Functional mobility training;Therapeutic activities;Therapeutic exercise;Balance training;Neuromuscular re-education;Patient/family education;Wheelchair mobility training;Manual techniques;Energy conservation;Taping    PT Next Visit Plan  gait training and balance, standing exercises as tolerated.  Reassess FOTO       Patient will benefit from skilled therapeutic intervention in order to improve the following deficits and impairments:  Abnormal gait, Impaired sensation, Improper body mechanics, Pain, Postural dysfunction, Increased muscle spasms, Decreased mobility, Decreased coordination, Decreased activity tolerance, Decreased endurance, Cardiopulmonary status limiting activity, Decreased range of motion, Decreased strength, Hypomobility, Obesity, Difficulty walking, Decreased balance  Visit Diagnosis: Complex regional pain syndrome type 1 of both lower extremities  Pain in both lower extremities  History of falling  Muscle weakness (generalized)  Difficulty in walking, not  elsewhere classified     Problem List There are no active problems to display for this patient.  Pura Spice, PT, DPT # [phone] 04/09/2018, 10:46 AM  [facility] [address] [place], [place], [address] Phone: [phone]   Fax:  [phone]  Name: Katie Neal MRN: [id number] Date of Birth: 02-26-1970

## 2018-04-09 ENCOUNTER — Ambulatory Visit: Payer: Medicare Other | Admitting: Physical Therapy

## 2018-04-14 ENCOUNTER — Ambulatory Visit: Payer: Medicare Other | Admitting: Physical Therapy

## 2018-04-16 ENCOUNTER — Encounter: Payer: Medicare Other | Admitting: Physical Therapy

## 2018-04-21 ENCOUNTER — Encounter: Payer: Medicare Other | Admitting: Physical Therapy

## 2018-04-23 ENCOUNTER — Encounter: Payer: Medicare Other | Admitting: Physical Therapy

## 2018-04-28 ENCOUNTER — Encounter: Payer: Medicare Other | Admitting: Physical Therapy

## 2018-04-30 ENCOUNTER — Encounter: Payer: Medicare Other | Admitting: Physical Therapy

## 2018-05-06 DIAGNOSIS — R519 Headache, unspecified: Secondary | ICD-10-CM | POA: Insufficient documentation

## 2018-06-18 ENCOUNTER — Ambulatory Visit: Payer: Medicare Other | Attending: Physician Assistant | Admitting: Physical Therapy

## 2018-06-18 ENCOUNTER — Encounter: Payer: Self-pay | Admitting: Physical Therapy

## 2018-06-18 ENCOUNTER — Other Ambulatory Visit: Payer: Self-pay

## 2018-06-18 DIAGNOSIS — M79605 Pain in left leg: Secondary | ICD-10-CM | POA: Diagnosis present

## 2018-06-18 DIAGNOSIS — G90523 Complex regional pain syndrome I of lower limb, bilateral: Secondary | ICD-10-CM | POA: Diagnosis present

## 2018-06-18 DIAGNOSIS — Z9181 History of falling: Secondary | ICD-10-CM | POA: Diagnosis present

## 2018-06-18 DIAGNOSIS — M79604 Pain in right leg: Secondary | ICD-10-CM | POA: Diagnosis present

## 2018-06-18 DIAGNOSIS — M6281 Muscle weakness (generalized): Secondary | ICD-10-CM | POA: Diagnosis present

## 2018-06-18 DIAGNOSIS — R262 Difficulty in walking, not elsewhere classified: Secondary | ICD-10-CM | POA: Insufficient documentation

## 2018-06-18 NOTE — Therapy (Signed)
[place] [facility] [address] [place], [place], [address] Phone: [phone]   Fax:  [phone]  Physical Therapy Treatment  Patient Details  Name: Katie LangeMichelle L Neal MRN: [id number] Date of Birth: 08-Mar-1970 Referring Provider (PT): Albertina Parrobinson, Anthony PA-C   Encounter Date: 06/18/2018  PT End of Session - 06/18/18 1352    Visit Number  7    Number of Visits  18    Date for PT Re-Evaluation  07/16/18    Authorization Type  7/10    Authorization - Visit Number  2    Authorization - Number of Visits  10    PT Start Time  1359    PT Stop Time  1443    PT Time Calculation (min)  44 min    Equipment Utilized During Treatment  Gait belt    Activity Tolerance  Patient limited by pain;Patient limited by fatigue    Behavior During Therapy  [facility] for tasks assessed/performed;Restless;Anxious       Past Medical History:  Diagnosis Date  . Arthritis   . Asthma   . CRPS (complex regional pain syndrome type I)   . Depression   . IBS (irritable bowel syndrome)   . Insomnia   . PTSD (post-traumatic stress disorder)     Past Surgical History:  Procedure Laterality Date  . ABDOMINAL HYSTERECTOMY    . ACHILLES TENDON REPAIR  1982  . APPENDECTOMY    . FOOT SURGERY  2002   due to deformity  . HIP SURGERY  2012  . KNEE ARTHROSCOPY    . LAPAROSCOPIC HYSTERECTOMY  2000  . REPAIR TENDONS LEG     x2    There were no vitals filed for this visit.  Subjective Assessment - 06/18/18 1403    Subjective  Patient states that everything has gone down hill since her last appointment. Patient states that the pain in her back is radiating upwards which started a couple weeks ago; the pain in "my legs is pretty bad." Patient also notes some redness and increased pain in BUE. Patient has increased Elavil dosage which has helped with headaches and allowing for improved sleep.    Pertinent History  Onset: 2015 patient broke 5th metatarsal which triggered  CRPS. She found out she had osteopenia at the same time, and began taking Boniva which caused other complications: GERD, hiatal hernia, among others.     Limitations  Lifting;Standing;Walking;House hold activities;Sitting    How long can you sit comfortably?  10-15 min    How long can you stand comfortably?  supported: <5 min; unsupported <5 min    How long can you walk comfortably?  w/ RW <5 min, without AD household distances    Patient Stated Goals  Regain strength, more mobility    Currently in Pain?  Yes    Pain Score  6     Pain Location  Generalized    Pain Descriptors / Indicators  Aching    Pain Type  Chronic pain    Pain Onset  More than a month ago    Pain Frequency  Constant      TREATMENT  Goals and outcomes reassessed.  Neuromuscular Re-education: Dynamic balance with varied gait patterns (faded UE support):  Exaggerated hip flexion x3 laps  Exaggerated heel strike x3 laps  Lateral walking x2 laps Seated B towel scrunches 1 min each. Seated ankle windshield wipers x20 each Pain science educational approaches to encourage refocus on function as opposed to  pain.   Patient educated throughout session on appropriate technique and form using multi-modal cueing, HEP, and activity modification. Patient articulated understanding and returned demonstration.    ASSESSMENT Patient presents to clinic with excellent motivation to participate in therapy. Patient demonstrates deficits in balance, LE strength, mobility, gait mechanics, and perception of function as evidenced by 5TSTS of 43 sec, FOTO score of 28/100, and reliance on UE support for standing activity. Patient able to achieve supported and dynamic standing tolerance >5 min during today's session and responded positively to active interventions. Patient's condition has the potential to improve in response to therapy. Maximum improvement is yet to be obtained. The anticipated improvement is attainable and reasonable in a  generally predictable time. Patient will benefit from continued skilled therapeutic intervention to address remaining deficits in balance, LE strength, mobility, gait mechanics, and perception of function in order to decrease risk of falls, increase function, and improve overall QOL.     PT Long Term Goals - 06/18/18 1404      PT LONG TERM GOAL #1   Title  Patient will demonstrate improved function as evidenced by a FOTO score equal to or greater than 37/100 for improved safety and QOL.    Baseline  IE: 28/100; 06/18/2018: 28/100    Time  4    Period  Weeks    Status  On-going    Target Date  07/16/18      PT LONG TERM GOAL #2   Title  Patient will decrease 5TSTS by at least 3 seconds until patient has achieved normative ranges based on age in order to demonstrate clinically significant improvement in LE strength.    Baseline  IE: 48.2s with BUE support.  3/16: 42.1 seconds 5/27: 43.5 seconds    Time  4    Period  Weeks    Status  On-going    Target Date  07/16/18      PT LONG TERM GOAL #3   Title  Pt will be independent with HEP in order to improve strength and balance in order to decrease fall risk and improve function at home and work.     Baseline  IE: not initiated.  3/16: pt. has HEP but limited compliance. 06/18/18: 2x/week    Time  4    Period  Weeks    Status  On-going    Target Date  07/16/18      PT LONG TERM GOAL #4   Title  Patient will demonstrate improved balance as evidenced by her ability to stand unsupported for >8 min to allow for safe showering/bathing with improved independence.    Baseline  IE: 2:18/ requires supervision for showering at home.  3/16: 2 recent falls at home (posterior). 5/27: "maybe 1 fall since last visit"    Time  4    Period  Weeks    Status  On-going    Target Date  07/16/18            Plan - 06/18/18 1353    Clinical Impression Statement  Patient presents to clinic with excellent motivation to participate in therapy. Patient  demonstrates deficits in balance, LE strength, mobility, gait mechanics, and perception of function as evidenced by 5TSTS of 43 sec, FOTO score of 28/100, and reliance on UE support for standing activity. Patient able to achieve supported and dynamic standing tolerance >5 min during today's session and responded positively to active interventions. Patient's condition has the potential to improve in response to therapy. Maximum improvement  is yet to be obtained. The anticipated improvement is attainable and reasonable in a generally predictable time. Patient will benefit from continued skilled therapeutic intervention to address remaining deficits in balance, LE strength, mobility, gait mechanics, and perception of function in order to decrease risk of falls, increase function, and improve overall QOL.    Stability/Clinical Decision Making  Unstable/Unpredictable    Rehab Potential  Fair    PT Frequency  2x / week    PT Duration  4 weeks    PT Treatment/Interventions  ADLs/Self Care Home Management;Aquatic Therapy;Cryotherapy;Moist Heat;Electrical Stimulation;Gait training;Stair training;Functional mobility training;Therapeutic activities;Therapeutic exercise;Balance training;Neuromuscular re-education;Patient/family education;Wheelchair mobility training;Manual techniques;Energy conservation;Taping    PT Next Visit Plan  gait training and balance, standing exercises as tolerated.  Reassess FOTO    Consulted and Agree with Plan of Care  Patient       Patient will benefit from skilled therapeutic intervention in order to improve the following deficits and impairments:  Abnormal gait, Impaired sensation, Improper body mechanics, Pain, Postural dysfunction, Increased muscle spasms, Decreased mobility, Decreased coordination, Decreased activity tolerance, Decreased endurance, Cardiopulmonary status limiting activity, Decreased range of motion, Decreased strength, Hypomobility, Obesity, Difficulty walking,  Decreased balance  Visit Diagnosis: Complex regional pain syndrome type 1 of both lower extremities  Pain in both lower extremities  History of falling  Muscle weakness (generalized)  Difficulty in walking, not elsewhere classified     Problem List There are no active problems to display for this patient.  Sheria Lang PT, DPT [phone] 06/19/2018, 5:31 PM  [place] [facility] [address] [place], [name], [address] Phone: [phone]   Fax:  [phone]  Name: SHANADA AERTS MRN: [id number] Date of Birth: 1971-01-15

## 2018-06-24 ENCOUNTER — Ambulatory Visit: Payer: Medicare Other | Attending: Physician Assistant | Admitting: Physical Therapy

## 2018-06-24 ENCOUNTER — Other Ambulatory Visit: Payer: Self-pay

## 2018-06-24 ENCOUNTER — Encounter: Payer: Self-pay | Admitting: Physical Therapy

## 2018-06-24 DIAGNOSIS — M6281 Muscle weakness (generalized): Secondary | ICD-10-CM | POA: Insufficient documentation

## 2018-06-24 DIAGNOSIS — G90523 Complex regional pain syndrome I of lower limb, bilateral: Secondary | ICD-10-CM | POA: Insufficient documentation

## 2018-06-24 DIAGNOSIS — Z9181 History of falling: Secondary | ICD-10-CM | POA: Diagnosis present

## 2018-06-24 DIAGNOSIS — R262 Difficulty in walking, not elsewhere classified: Secondary | ICD-10-CM | POA: Insufficient documentation

## 2018-06-24 DIAGNOSIS — M79604 Pain in right leg: Secondary | ICD-10-CM

## 2018-06-24 DIAGNOSIS — M79605 Pain in left leg: Secondary | ICD-10-CM | POA: Diagnosis present

## 2018-06-24 NOTE — Therapy (Signed)
[place] [facility] [address] [place], [place], [address] Phone: [phone]   Fax:  [phone]  Physical Therapy Treatment  Patient Details  Name: Katie Neal MRN: [id number] Date of Birth: 1970/03/17 Referring Provider (PT): Albertina Parrobinson, Anthony PA-C   Encounter Date: 06/24/2018  PT End of Session - 06/24/18 1350    Visit Number  8    Number of Visits  18    Date for PT Re-Evaluation  07/16/18    Authorization Type  8/10    Authorization - Visit Number  3    Authorization - Number of Visits  10    PT Start Time  1300    PT Stop Time  1342    PT Time Calculation (min)  42 min    Equipment Utilized During Treatment  Gait belt    Activity Tolerance  Patient limited by pain    Behavior During Therapy  [facility] for tasks assessed/performed;Restless;Anxious;Agitated       Past Medical History:  Diagnosis Date  . Arthritis   . Asthma   . CRPS (complex regional pain syndrome type I)   . Depression   . IBS (irritable bowel syndrome)   . Insomnia   . PTSD (post-traumatic stress disorder)     Past Surgical History:  Procedure Laterality Date  . ABDOMINAL HYSTERECTOMY    . ACHILLES TENDON REPAIR  1982  . APPENDECTOMY    . FOOT SURGERY  2002   due to deformity  . HIP SURGERY  2012  . KNEE ARTHROSCOPY    . LAPAROSCOPIC HYSTERECTOMY  2000  . REPAIR TENDONS LEG     x2    There were no vitals filed for this visit.  Subjective Assessment - 06/24/18 1301    Subjective  Patient reports that "today is not a good day." She is experiencing discomfort in her back. The pain started last night after she accidentally hit her cat when she had a muscle jerk reflex.    Pertinent History  Onset: 2015 patient broke 5th metatarsal which triggered CRPS. She found out she had osteopenia at the same time, and began taking Boniva which caused other complications: GERD, hiatal hernia, among others.     Limitations  Lifting;Standing;Walking;House hold  activities;Sitting    How long can you sit comfortably?  10-15 min    How long can you stand comfortably?  supported: <5 min; unsupported <5 min    How long can you walk comfortably?  w/ RW <5 min, without AD household distances    Patient Stated Goals  Regain strength, more mobility    Currently in Pain?  Yes    Pain Onset  More than a month ago       TREATMENT  Neuromuscular Re-education: PCST, diaphragmatic breathing in supported semi-recumbent for nervous system downtraining and pain modulation Education: active versus passive interventions; importance of practicing pain coping skills when not in clinic; the goal of self-management and improving internal locus of control; benefits of psychological support; graded activity.  Patient educated extensively throughout session utilizing techniques from motivational interviewing. Patient articulates understanding but quickly reverts to external locus reasoning and heavy focus on pain/suffering/limitations. Patient seems extremely attached to multiple diagnoses as her primary identifying attributes.   Patient Response to interventions: Patient reports feeling less restless after diaphragmatic breathing.  ASSESSMENT Patient presents to clinic with increased intensity of pain and restlessness. Patient demonstrates deficits in pain coping skills, self-management, and internal locus of control as evidenced by  use of short quick breathing style ("my lamaze training") when in high pain state, repeated assertions of reliance on and need for pharmaceutical interventions. Patient able to achieve intermittent success with coordinating diaphragmatic breathing during today's session. Patient will benefit from continued skilled therapeutic intervention to address remaining deficits in pain coping skills, self-management, and internal locus of control in order to increase function and improve overall QOL.    PT Long Term Goals - 06/18/18 1404      PT LONG  TERM GOAL #1   Title  Patient will demonstrate improved function as evidenced by a FOTO score equal to or greater than 37/100 for improved safety and QOL.    Baseline  IE: 28/100; 06/18/2018: 28/100    Time  4    Period  Weeks    Status  On-going    Target Date  07/16/18      PT LONG TERM GOAL #2   Title  Patient will decrease 5TSTS by at least 3 seconds until patient has achieved normative ranges based on age in order to demonstrate clinically significant improvement in LE strength.    Baseline  IE: 48.2s with BUE support.  3/16: 42.1 seconds 5/27: 43.5 seconds    Time  4    Period  Weeks    Status  On-going    Target Date  07/16/18      PT LONG TERM GOAL #3   Title  Pt will be independent with HEP in order to improve strength and balance in order to decrease fall risk and improve function at home and work.     Baseline  IE: not initiated.  3/16: pt. has HEP but limited compliance. 06/18/18: 2x/week    Time  4    Period  Weeks    Status  On-going    Target Date  07/16/18      PT LONG TERM GOAL #4   Title  Patient will demonstrate improved balance as evidenced by her ability to stand unsupported for >8 min to allow for safe showering/bathing with improved independence.    Baseline  IE: 2:18/ requires supervision for showering at home.  3/16: 2 recent falls at home (posterior). 5/27: "maybe 1 fall since last visit"    Time  4    Period  Weeks    Status  On-going    Target Date  07/16/18            Plan - 06/24/18 1350    Clinical Impression Statement  Patient presents to clinic with increased intensity of pain and restlessness. Patient demonstrates deficits in pain coping skills, self-management, and internal locus of control as evidenced by use of short quick breathing style ("my lamaze training") when in high pain state, repeated assertions of reliance on and need for pharmaceutical interventions. Patient able to achieve intermittent success with coordinating diaphragmatic  breathing during today's session. Patient will benefit from continued skilled therapeutic intervention to address remaining deficits in pain coping skills, self-management, and internal locus of control in order to increase function and improve overall QOL.    Stability/Clinical Decision Making  Unstable/Unpredictable    Rehab Potential  Fair    PT Frequency  2x / week    PT Duration  4 weeks    PT Treatment/Interventions  ADLs/Self Care Home Management;Aquatic Therapy;Cryotherapy;Moist Heat;Electrical Stimulation;Gait training;Stair training;Functional mobility training;Therapeutic activities;Therapeutic exercise;Balance training;Neuromuscular re-education;Patient/family education;Wheelchair mobility training;Manual techniques;Energy conservation;Taping    PT Next Visit Plan  PCST    Consulted and Agree with  Plan of Care  Patient       Patient will benefit from skilled therapeutic intervention in order to improve the following deficits and impairments:  Abnormal gait, Impaired sensation, Improper body mechanics, Pain, Postural dysfunction, Increased muscle spasms, Decreased mobility, Decreased coordination, Decreased activity tolerance, Decreased endurance, Cardiopulmonary status limiting activity, Decreased range of motion, Decreased strength, Hypomobility, Obesity, Difficulty walking, Decreased balance  Visit Diagnosis: Complex regional pain syndrome type 1 of both lower extremities  Pain in both lower extremities  History of falling  Muscle weakness (generalized)  Difficulty in walking, not elsewhere classified     Problem List There are no active problems to display for this patient.  Sheria Lang PT, DPT [phone] 06/25/2018, 8:29 AM  [place] [facility] [address] [place], [name], [address] Phone: [phone]   Fax:  [phone]  Name: Katie Neal MRN: [id number] Date of Birth: 02/28/1970

## 2018-06-26 ENCOUNTER — Encounter: Payer: Medicare Other | Admitting: Physical Therapy

## 2018-06-30 ENCOUNTER — Ambulatory Visit: Payer: Medicare Other | Admitting: Physical Therapy

## 2018-07-02 ENCOUNTER — Other Ambulatory Visit: Payer: Self-pay

## 2018-07-02 ENCOUNTER — Ambulatory Visit: Payer: Medicare Other | Admitting: Physical Therapy

## 2018-07-02 ENCOUNTER — Encounter: Payer: Self-pay | Admitting: Physical Therapy

## 2018-07-02 DIAGNOSIS — G90523 Complex regional pain syndrome I of lower limb, bilateral: Secondary | ICD-10-CM

## 2018-07-02 DIAGNOSIS — M6281 Muscle weakness (generalized): Secondary | ICD-10-CM

## 2018-07-02 DIAGNOSIS — R262 Difficulty in walking, not elsewhere classified: Secondary | ICD-10-CM

## 2018-07-02 DIAGNOSIS — Z9181 History of falling: Secondary | ICD-10-CM

## 2018-07-02 DIAGNOSIS — M79605 Pain in left leg: Secondary | ICD-10-CM

## 2018-07-02 DIAGNOSIS — M79604 Pain in right leg: Secondary | ICD-10-CM

## 2018-07-03 NOTE — Therapy (Signed)
[place] [facility] [address] [place], [place], [address] Phone: [phone]   Fax:  [phone]  Physical Therapy Treatment  Patient Details  Name: Katie Neal MRN: [id number] Date of Birth: [birth year] Referring Provider (PT): Albertina Parrobinson, Anthony PA-C   Encounter Date: 07/02/2018  PT End of Session - 07/03/18 1249    Visit Number  9    Number of Visits  18    Date for PT Re-Evaluation  07/16/18    Authorization Type  9/10    Authorization - Visit Number  9    Authorization - Number of Visits  10    PT Start Time  1400    PT Stop Time  1446    PT Time Calculation (min)  46 min    Equipment Utilized During Treatment  Gait belt    Activity Tolerance  Patient limited by pain    Behavior During Therapy  [facility] for tasks assessed/performed;Restless;Anxious       Past Medical History:  Diagnosis Date  . Arthritis   . Asthma   . CRPS (complex regional pain syndrome type I)   . Depression   . IBS (irritable bowel syndrome)   . Insomnia   . PTSD (post-traumatic stress disorder)     Past Surgical History:  Procedure Laterality Date  . ABDOMINAL HYSTERECTOMY    . ACHILLES TENDON REPAIR  1982  . APPENDECTOMY    . FOOT SURGERY  2002   due to deformity  . HIP SURGERY  2012  . KNEE ARTHROSCOPY    . LAPAROSCOPIC HYSTERECTOMY  2000  . REPAIR TENDONS LEG     x2    There were no vitals filed for this visit.  Subjective Assessment - 07/02/18 1359    Subjective  Patient states that it has been a rough week. Patient notes her R leg is limiting her from hip to foot, L leg is knee to foot. Patient notes that she has not been getting up at home much 2/2 to pain. Patient has only been able to go to the bathroom and back to bed. All meals have been brought to her bed. Patient states that since her last visit she has been debilitated.     Pertinent History  Onset: 2015 patient broke 5th metatarsal which triggered CRPS. She found out she had  osteopenia at the same time, and began taking Boniva which caused other complications: GERD, hiatal hernia, among others.     Limitations  Lifting;Standing;Walking;House hold activities;Sitting    How long can you sit comfortably?  10-15 min    How long can you stand comfortably?  supported: <5 min; unsupported <5 min    How long can you walk comfortably?  w/ RW <5 min, without AD household distances    Patient Stated Goals  Regain strength, more mobility    Currently in Pain?  Yes    Pain Onset  More than a month ago       TREATMENT  Neuromuscular Re-education: Patient educated extensively on:  Gentle joint motion for BLE and BUE for pain modulation and promotion of internal locus of control  Graded Motor Imagery using Recognise App  PCST: scheduling pleasurable activity- [email or web address] free course on Buddhism and Modern Psychology via [employer].  Benefits of incorporating counseling/psychological support to process pain experience   Patient educated throughout session on appropriate technique and form using multi-modal cueing, HEP, and activity modification. Patient articulated understanding and returned demonstration.  Patient Response to  interventions: Patient reports 7/10 confidence in incorporating gentle motion activities for pain modulation at home.  ASSESSMENT Patient presents to clinic with decreased motivation and continued external locus of control with respect to participation in therapy. Patient demonstrates deficits in pain coping skills, self-management, and internal locus of control as evidenced by repeated mentioning of bed rest and pharmaceutical intervention for pain modulation. Patient able to achieve 7/10 confidence for self-management utilizing gentle motion during today's session and responded positively to educational interventions utilizing motivational interviewing techniques. Patient will benefit from continued skilled therapeutic intervention to address  remaining deficits in pain coping skills, self-management, and internal locus of control in order to increase function and improve overall QOL.    PT Long Term Goals - 06/18/18 1404      PT LONG TERM GOAL #1   Title  Patient will demonstrate improved function as evidenced by a FOTO score equal to or greater than 37/100 for improved safety and QOL.    Baseline  IE: 28/100; 06/18/2018: 28/100    Time  4    Period  Weeks    Status  On-going    Target Date  07/16/18      PT LONG TERM GOAL #2   Title  Patient will decrease 5TSTS by at least 3 seconds until patient has achieved normative ranges based on age in order to demonstrate clinically significant improvement in LE strength.    Baseline  IE: 48.2s with BUE support.  3/16: 42.1 seconds 5/27: 43.5 seconds    Time  4    Period  Weeks    Status  On-going    Target Date  07/16/18      PT LONG TERM GOAL #3   Title  Pt will be independent with HEP in order to improve strength and balance in order to decrease fall risk and improve function at home and work.     Baseline  IE: not initiated.  3/16: pt. has HEP but limited compliance. 06/18/18: 2x/week    Time  4    Period  Weeks    Status  On-going    Target Date  07/16/18      PT LONG TERM GOAL #4   Title  Patient will demonstrate improved balance as evidenced by her ability to stand unsupported for >8 min to allow for safe showering/bathing with improved independence.    Baseline  IE: 2:18/ requires supervision for showering at home.  3/16: 2 recent falls at home (posterior). 5/27: "maybe 1 fall since last visit"    Time  4    Period  Weeks    Status  On-going    Target Date  07/16/18            Plan - 07/03/18 1250    Clinical Impression Statement  Patient presents to clinic with decreased motivation and continued external locus of control with respect to participation in therapy. Patient demonstrates deficits in pain coping skills, self-management, and internal locus of control  as evidenced by repeated mentioning of bed rest and pharmaceutical intervention for pain modulation. Patient able to achieve 7/10 confidence for self-management utilizing gentle motion during today's session and responded positively to educational interventions utilizing motivational interviewing techniques. Patient will benefit from continued skilled therapeutic intervention to address remaining deficits in pain coping skills, self-management, and internal locus of control in order to increase function and improve overall QOL.    Stability/Clinical Decision Making  Unstable/Unpredictable    Rehab Potential  Fair  PT Frequency  2x / week    PT Duration  4 weeks    PT Treatment/Interventions  ADLs/Self Care Home Management;Aquatic Therapy;Cryotherapy;Moist Heat;Electrical Stimulation;Gait training;Stair training;Functional mobility training;Therapeutic activities;Therapeutic exercise;Balance training;Neuromuscular re-education;Patient/family education;Wheelchair mobility training;Manual techniques;Energy conservation;Taping    PT Next Visit Plan  PCST    Consulted and Agree with Plan of Care  Patient       Patient will benefit from skilled therapeutic intervention in order to improve the following deficits and impairments:  Abnormal gait, Impaired sensation, Improper body mechanics, Pain, Postural dysfunction, Increased muscle spasms, Decreased mobility, Decreased coordination, Decreased activity tolerance, Decreased endurance, Cardiopulmonary status limiting activity, Decreased range of motion, Decreased strength, Hypomobility, Obesity, Difficulty walking, Decreased balance  Visit Diagnosis: Complex regional pain syndrome type 1 of both lower extremities   Pain in both lower extremities   History of falling  Muscle weakness (generalized)  Difficulty in walking, not elsewhere classified      Problem List There are no active problems to display for this patient.  Myles Gip PT, DPT  [phone] 07/03/2018, 1:00 PM  [place] [facility] [address] [place], [place], [address] Phone: [phone]   Fax:  [phone]  Name: Katie Neal MRN: [id number] Date of Birth: 1970/12/04

## 2018-07-09 ENCOUNTER — Encounter: Payer: Medicare Other | Admitting: Physical Therapy

## 2018-07-14 ENCOUNTER — Encounter: Payer: Medicare Other | Admitting: Physical Therapy

## 2018-07-16 ENCOUNTER — Encounter: Payer: Self-pay | Admitting: Physical Therapy

## 2018-07-16 ENCOUNTER — Ambulatory Visit: Payer: Medicare Other | Admitting: Physical Therapy

## 2018-07-16 ENCOUNTER — Other Ambulatory Visit: Payer: Self-pay

## 2018-07-16 DIAGNOSIS — M79604 Pain in right leg: Secondary | ICD-10-CM

## 2018-07-16 DIAGNOSIS — G90523 Complex regional pain syndrome I of lower limb, bilateral: Secondary | ICD-10-CM | POA: Diagnosis not present

## 2018-07-16 DIAGNOSIS — Z9181 History of falling: Secondary | ICD-10-CM

## 2018-07-16 DIAGNOSIS — M79605 Pain in left leg: Secondary | ICD-10-CM

## 2018-07-16 DIAGNOSIS — M6281 Muscle weakness (generalized): Secondary | ICD-10-CM

## 2018-07-16 DIAGNOSIS — R262 Difficulty in walking, not elsewhere classified: Secondary | ICD-10-CM

## 2018-07-16 NOTE — Therapy (Signed)
[facility] [address] [place], [place], [address] Phone: [phone]   Fax:  [phone]  Physical Therapy Treatment Physical Therapy Progress Note/Recertification   Dates of reporting period  06/18/2018   to   07/16/2018   Patient Details  Name: Katie Neal MRN: [id number] Date of Birth: 03-Jun-1970 Referring Provider (PT): Hessie Dibble PA-C   Encounter Date: 07/16/2018  PT End of Session - 07/16/18 1405    Visit Number  10    Number of Visits  18    Date for PT Re-Evaluation  07/16/18    Authorization Type  10/10    Authorization - Visit Number  10    Authorization - Number of Visits  10    PT Start Time  1761    PT Stop Time  1438    PT Time Calculation (min)  40 min    Equipment Utilized During Treatment  Gait belt    Activity Tolerance  Patient limited by pain    Behavior During Therapy  [facility] for tasks assessed/performed;Restless;Anxious       Past Medical History:  Diagnosis Date  . Arthritis   . Asthma   . CRPS (complex regional pain syndrome type I)   . Depression   . IBS (irritable bowel syndrome)   . Insomnia   . PTSD (post-traumatic stress disorder)     Past Surgical History:  Procedure Laterality Date  . ABDOMINAL HYSTERECTOMY    . [place]  . APPENDECTOMY    . FOOT SURGERY  2002   due to deformity  . HIP SURGERY  2012  . KNEE ARTHROSCOPY    . LAPAROSCOPIC HYSTERECTOMY  2000  . REPAIR TENDONS LEG     x2    There were no vitals filed for this visit.  Subjective Assessment - 07/16/18 1403    Subjective  Patient presents to clinic with complaints knee pain after her knee buckling; she states that she "knows her knee inside and out" and feels like she "tore the meniscus again and probably the ACL." Patient able ot walk in clinic using rollator.    Pertinent History  Onset: 2015 patient broke 5th metatarsal which triggered CRPS. She found out she had osteopenia at the  same time, and began taking Boniva which caused other complications: GERD, hiatal hernia, among others.     Limitations  Lifting;Standing;Walking;House hold activities;Sitting    How long can you sit comfortably?  10-15 min    How long can you stand comfortably?  supported: <5 min; unsupported <5 min    How long can you walk comfortably?  w/ RW <5 min, without AD household distances    Patient Stated Goals  Regain strength, more mobility    Pain Onset  More than a month ago       TREATMENT  Pre-treatment assessment: R knee assessed for damage (-) anterior drawer, (+) joint line tenderness, (+) point tenderness at patellar pole  Neuromuscular Re-education: Goals reassessed; see below Revisited education on scheduling pleasurable activity, utilizing laterality training via app.   Seated activity for nervous system downtraining:  Heel raises, B, x20 Toe raises, B, x20 Hip flexion march, B, x10 LE PNF D1, B, x10 Hip adduction/abduction x20 Glute sets 3 sec x10   Patient educated throughout session on appropriate technique and form using multi-modal cueing, HEP, and activity modification. Patient articulated understanding and returned demonstration.  Patient Response to interventions: Patient reporting high levels of pain  throughout and laments that this is just her life. Patient excited to be done with session.  ASSESSMENT Patient presents to clinic to participate in therapy ambulating with 4WW decreased stride length bilaterally but generally symmetrical stance time. Patient demonstrates deficits in pain coping skills, self-management, and internal locus of control as evidenced by continued limitations in standing time (<4 min), minimal practice of HEP (ankle pumps 1x/day, all other activities patient reports deferring). Patient able to achieve 31/100 on FOTO during today's session indicating a 3 point improvement in the past month. Patient continues to demonstrate significant barriers  to success including attachment to diagnosis and external locus of control. Patient would likely benefit from follow-up with psychological support to improve pain management strategies. Patient will benefit from continued skilled therapeutic intervention to address remaining deficits in strength, mobility, balance, pain coping skills, self-management, and internal locus of control in order to increase function, and improve overall QOL.          PT Long Term Goals - 07/16/18 1406      PT LONG TERM GOAL #1   Title  Patient will demonstrate improved function as evidenced by a FOTO score equal to or greater than 37/100 for improved safety and QOL.    Baseline  IE: 28/100; 06/18/2018: 28/100; 07/16/2018: 31/100    Time  4    Period  Weeks    Status  On-going    Target Date  08/13/18      PT LONG TERM GOAL #2   Title  Patient will decrease 5TSTS by at least 3 seconds until patient has achieved normative ranges based on age in order to demonstrate clinically significant improvement in LE strength.    Baseline  IE: 48.2s with BUE support.  3/16: 42.1 seconds 5/27: 43.5 seconds; 07/16/2018: 57.3 seconds    Time  4    Period  Weeks    Status  On-going    Target Date  08/13/18      PT LONG TERM GOAL #3   Title  Pt will be independent with HEP in order to improve strength and balance in order to decrease fall risk and improve function at home and work.     Baseline  IE: not initiated.  3/16: pt. has HEP but limited compliance. 06/18/18: 2x/week; 07/16/2018: 1x/day    Time  4    Period  Weeks    Status  Partially Met    Target Date  08/13/18      PT LONG TERM GOAL #4   Title  Patient will demonstrate improved balance as evidenced by her ability to stand unsupported for >8 min to allow for safe showering/bathing with improved independence.    Baseline  IE: 2:18/ requires supervision for showering at home.  3/16: 2 recent falls at home (posterior). 5/27: "maybe 1 fall since last visit"; 07/16/2018:  4 min standing    Time  4    Period  Weeks    Status  On-going    Target Date  08/13/18            Plan - 07/16/18 1449    Clinical Impression Statement  Patient presents to clinic to participate in therapy ambulating with 4WW decreased stride length bilaterally but generally symmetrical stance time. Patient demonstrates deficits in pain coping skills, self-management, and internal locus of control as evidenced by continued limitations in standing time (<4 min), minimal practice of HEP (ankle pumps 1x/day, all other activities patient reports deferring). Patient able to achieve 31/100  on FOTO during today's session indicating a 3 point improvement in the past month. Patient continues to demonstrate significant barriers to success including attachment to diagnosis and external locus of control. Patient would likely benefit from follow-up with psychological support to improve pain management strategies. Patient will benefit from continued skilled therapeutic intervention to address remaining deficits in strength, mobility, balance, pain coping skills, self-management, and internal locus of control in order to increase function, and improve overall QOL.    Personal Factors and Comorbidities  Age;Education;Comorbidity 3+;Past/Current Experience;Time since onset of injury/illness/exacerbation;Social Background;Fitness    Comorbidities  arthritis, asthma, depression    Examination-Activity Limitations  Bathing;Hygiene/Grooming;Squat;Stairs;Lift;Bend;Locomotion Level;Stand;Reach Overhead;Carry;Sit;Dressing;Sleep    Examination-Participation Restrictions  Shop;Laundry;Cleaning;Meal Prep;Driving    Stability/Clinical Decision Making  Unstable/Unpredictable    Rehab Potential  Fair    PT Frequency  1x / week    PT Duration  4 weeks    PT Treatment/Interventions  ADLs/Self Care Home Management;Aquatic Therapy;Moist Heat;Electrical Stimulation;Gait training;Stair training;Functional mobility  training;Therapeutic activities;Therapeutic exercise;Balance training;Neuromuscular re-education;Patient/family education;Wheelchair mobility training;Manual techniques;Energy conservation;Taping    PT Next Visit Plan  PCST    PT Home Exercise Plan  Gentle joint mobility throughout BLE and lumbar; laterality training; graded activity (standing)    Consulted and Agree with Plan of Care  Patient       Patient will benefit from skilled therapeutic intervention in order to improve the following deficits and impairments:  Abnormal gait, Impaired sensation, Improper body mechanics, Pain, Postural dysfunction, Increased muscle spasms, Decreased mobility, Decreased coordination, Decreased activity tolerance, Decreased endurance, Cardiopulmonary status limiting activity, Decreased range of motion, Decreased strength, Hypomobility, Obesity, Difficulty walking, Decreased balance, Impaired perceived functional ability  Visit Diagnosis: 1. Complex regional pain syndrome type 1 of both lower extremities   2. Pain in both lower extremities   3. History of falling   4. Muscle weakness (generalized)   5. Difficulty in walking, not elsewhere classified        Problem List There are no active problems to display for this patient.  Myles Gip PT, DPT [phone] 07/16/2018, 3:03 PM  [place] [facility] [address] [place], [place], [address] Phone: [phone]   Fax:  [phone]  Name: Katie Neal MRN: [id number] Date of Birth: 1970-02-20

## 2018-07-21 ENCOUNTER — Ambulatory Visit: Payer: Medicare Other | Admitting: Physical Therapy

## 2018-07-21 ENCOUNTER — Other Ambulatory Visit: Payer: Self-pay

## 2018-07-21 ENCOUNTER — Encounter: Payer: Self-pay | Admitting: Physical Therapy

## 2018-07-21 DIAGNOSIS — M6281 Muscle weakness (generalized): Secondary | ICD-10-CM

## 2018-07-21 DIAGNOSIS — Z9181 History of falling: Secondary | ICD-10-CM

## 2018-07-21 DIAGNOSIS — G90523 Complex regional pain syndrome I of lower limb, bilateral: Secondary | ICD-10-CM | POA: Diagnosis not present

## 2018-07-21 DIAGNOSIS — M79604 Pain in right leg: Secondary | ICD-10-CM

## 2018-07-21 DIAGNOSIS — R262 Difficulty in walking, not elsewhere classified: Secondary | ICD-10-CM

## 2018-07-21 DIAGNOSIS — M79605 Pain in left leg: Secondary | ICD-10-CM

## 2018-07-21 NOTE — Therapy (Signed)
[facility] [address] [place], [place], [address] Phone: [phone]   Fax:  [phone]  Physical Therapy Treatment  Patient Details  Name: Katie Neal MRN: [id number] Date of Birth: 1970/03/12 Referring Provider (PT): Hessie Dibble PA-C   Encounter Date: 07/21/2018  PT End of Session - 07/21/18 1407    Visit Number  11    Number of Visits  18    Date for PT Re-Evaluation  08/11/18    Authorization Type  1/10    Authorization - Visit Number  10    Authorization - Number of Visits  10    PT Start Time  1400    PT Stop Time  1440    PT Time Calculation (min)  40 min    Equipment Utilized During Treatment  Gait belt    Activity Tolerance  Patient limited by pain    Behavior During Therapy  [facility] for tasks assessed/performed;Restless;Anxious       Past Medical History:  Diagnosis Date  . Arthritis   . Asthma   . CRPS (complex regional pain syndrome type I)   . Depression   . IBS (irritable bowel syndrome)   . Insomnia   . PTSD (post-traumatic stress disorder)     Past Surgical History:  Procedure Laterality Date  . ABDOMINAL HYSTERECTOMY    . [place]  . APPENDECTOMY    . FOOT SURGERY  2002   due to deformity  . HIP SURGERY  2012  . KNEE ARTHROSCOPY    . LAPAROSCOPIC HYSTERECTOMY  2000  . REPAIR TENDONS LEG     x2    There were no vitals filed for this visit.  Subjective Assessment - 07/21/18 1404    Subjective  Patient states that she had a bad flare over the weekend from the waist down. Allodynia occurred on the left foot. Patient took medication and rested with legs elevated. Patient continues to have pain today and reports needing to be very careful with how she walks and cannot make sudden movements. She states that the L thumb is being paralyzed by CRPS pain.    Pertinent History  Onset: 2015 patient broke 5th metatarsal which triggered CRPS. She found out she had osteopenia  at the same time, and began taking Boniva which caused other complications: GERD, hiatal hernia, among others.     Limitations  Lifting;Standing;Walking;House hold activities;Sitting    How long can you sit comfortably?  10-15 min    How long can you stand comfortably?  supported: <5 min; unsupported <5 min    How long can you walk comfortably?  w/ RW <5 min, without AD household distances    Patient Stated Goals  Regain strength, more mobility    Pain Onset  More than a month ago      TREATMENT  Neuromuscular Re-education: Seated activity for nervous system downtraining:  Heel raises, B, x20 Toe raises, B, x20 LAQ, B, x20 Hip flexion march, B, x15 LE PNF D1, B, x10  Hip adduction/abduction x20  Glute sets 3 sec x10  Patient educated throughout session on appropriate technique and form using multi-modal cueing, HEP, and activity modification. Patient articulated understanding and returned demonstration.  Patient Response to interventions: Patient reports confidence in ability to perform HEP 2x/week citing reasons: ease, portable.  ASSESSMENT Patient presents to clinic to participate in therapy in the presence of increased pain 2/2 to a CRPS "flare" over the weekend. Patient demonstrates  deficits in pain coping skills, self-management, and internal locus of control as evidenced by increased reliance on external factors/individuals for improved adherence to pain management as well as Neurosurgeon. Patient able to achieve 25 minutes of seated activity during today's session and responded positively to educational interventions utilizing motivational interviewing techniques. Patient will benefit from continued skilled therapeutic intervention to address remaining deficits in  pain coping skills, self-management, and internal locus of control in order to improve independence, increase function, and improve overall QOL.    PT Long Term Goals - 07/16/18 1406      PT LONG TERM  GOAL #1   Title  Patient will demonstrate improved function as evidenced by a FOTO score equal to or greater than 37/100 for improved safety and QOL.    Baseline  IE: 28/100; 06/18/2018: 28/100; 07/16/2018: 31/100    Time  4    Period  Weeks    Status  On-going    Target Date  08/13/18      PT LONG TERM GOAL #2   Title  Patient will decrease 5TSTS by at least 3 seconds until patient has achieved normative ranges based on age in order to demonstrate clinically significant improvement in LE strength.    Baseline  IE: 48.2s with BUE support.  3/16: 42.1 seconds 5/27: 43.5 seconds; 07/16/2018: 57.3 seconds    Time  4    Period  Weeks    Status  On-going    Target Date  08/13/18      PT LONG TERM GOAL #3   Title  Pt will be independent with HEP in order to improve strength and balance in order to decrease fall risk and improve function at home and work.     Baseline  IE: not initiated.  3/16: pt. has HEP but limited compliance. 06/18/18: 2x/week; 07/16/2018: 1x/day    Time  4    Period  Weeks    Status  Partially Met    Target Date  08/13/18      PT LONG TERM GOAL #4   Title  Patient will demonstrate improved balance as evidenced by her ability to stand unsupported for >8 min to allow for safe showering/bathing with improved independence.    Baseline  IE: 2:18/ requires supervision for showering at home.  3/16: 2 recent falls at home (posterior). 5/27: "maybe 1 fall since last visit"; 07/16/2018: 4 min standing    Time  4    Period  Weeks    Status  On-going    Target Date  08/13/18            Plan - 07/21/18 1408    Clinical Impression Statement  Patient presents to clinic to participate in therapy in the presence of increased pain 2/2 to a CRPS "flare" over the weekend. Patient demonstrates deficits in pain coping skills, self-management, and internal locus of control as evidenced by increased reliance on external factors/individuals for improved adherence to pain management as well as  Neurosurgeon. Patient able to achieve 25 minutes of seated activity during today's session and responded positively to educational interventions utilizing motivational interviewing techniques. Patient will benefit from continued skilled therapeutic intervention to address remaining deficits in  pain coping skills, self-management, and internal locus of control in order to improve independence, increase function, and improve overall QOL.    Personal Factors and Comorbidities  Age;Education;Comorbidity 3+;Past/Current Experience;Time since onset of injury/illness/exacerbation;Social Background;Fitness    Comorbidities  arthritis, asthma, depression    Examination-Activity Limitations  Bathing;Hygiene/Grooming;Squat;Stairs;Lift;Bend;Locomotion Level;Stand;Reach Overhead;Carry;Sit;Dressing;Sleep    Examination-Participation Restrictions  Shop;Laundry;Cleaning;Meal Prep;Driving    Stability/Clinical Decision Making  Unstable/Unpredictable    Rehab Potential  Fair    PT Frequency  1x / week    PT Duration  4 weeks    PT Treatment/Interventions  ADLs/Self Care Home Management;Aquatic Therapy;Moist Heat;Electrical Stimulation;Gait training;Stair training;Functional mobility training;Therapeutic activities;Therapeutic exercise;Balance training;Neuromuscular re-education;Patient/family education;Wheelchair mobility training;Manual techniques;Energy conservation;Taping    PT Next Visit Plan  PCST    PT Home Exercise Plan  Gentle joint mobility throughout BLE and lumbar; laterality training; graded activity (standing)    Consulted and Agree with Plan of Care  Patient       Patient will benefit from skilled therapeutic intervention in order to improve the following deficits and impairments:  Abnormal gait, Impaired sensation, Improper body mechanics, Pain, Postural dysfunction, Increased muscle spasms, Decreased mobility, Decreased coordination, Decreased activity tolerance, Decreased endurance,  Cardiopulmonary status limiting activity, Decreased range of motion, Decreased strength, Hypomobility, Obesity, Difficulty walking, Decreased balance, Impaired perceived functional ability  Visit Diagnosis: 1. Complex regional pain syndrome type 1 of both lower extremities   2. Pain in both lower extremities   3. Muscle weakness (generalized)   4. Difficulty in walking, not elsewhere classified   5. History of falling        Problem List There are no active problems to display for this patient.  Myles Gip PT, DPT [phone] 07/21/2018, 2:38 PM  [facility] [address] [place], [place], [address] Phone: [phone]   Fax:  [phone]  Name: Katie Neal MRN: [id number] Date of Birth: Jun 03, 1970

## 2018-07-28 ENCOUNTER — Other Ambulatory Visit: Payer: Self-pay

## 2018-07-28 ENCOUNTER — Ambulatory Visit: Payer: Medicare Other | Attending: Physician Assistant | Admitting: Physical Therapy

## 2018-07-28 DIAGNOSIS — Z9181 History of falling: Secondary | ICD-10-CM | POA: Insufficient documentation

## 2018-07-28 DIAGNOSIS — M79604 Pain in right leg: Secondary | ICD-10-CM | POA: Insufficient documentation

## 2018-07-28 DIAGNOSIS — G90523 Complex regional pain syndrome I of lower limb, bilateral: Secondary | ICD-10-CM | POA: Insufficient documentation

## 2018-07-28 DIAGNOSIS — R262 Difficulty in walking, not elsewhere classified: Secondary | ICD-10-CM | POA: Insufficient documentation

## 2018-07-28 DIAGNOSIS — M6281 Muscle weakness (generalized): Secondary | ICD-10-CM | POA: Insufficient documentation

## 2018-07-28 DIAGNOSIS — M79605 Pain in left leg: Secondary | ICD-10-CM | POA: Insufficient documentation

## 2018-08-04 ENCOUNTER — Ambulatory Visit: Payer: Medicare Other | Admitting: Physical Therapy

## 2018-08-11 ENCOUNTER — Encounter: Payer: Medicare Other | Admitting: Physical Therapy

## 2018-08-15 ENCOUNTER — Other Ambulatory Visit: Payer: Self-pay | Admitting: Orthopedic Surgery

## 2018-08-15 DIAGNOSIS — G8929 Other chronic pain: Secondary | ICD-10-CM

## 2018-08-15 DIAGNOSIS — M5442 Lumbago with sciatica, left side: Secondary | ICD-10-CM

## 2018-08-18 ENCOUNTER — Ambulatory Visit: Payer: Medicare Other | Admitting: Physical Therapy

## 2018-08-18 ENCOUNTER — Encounter: Payer: Self-pay | Admitting: Physical Therapy

## 2018-08-18 ENCOUNTER — Other Ambulatory Visit: Payer: Self-pay

## 2018-08-18 DIAGNOSIS — G90523 Complex regional pain syndrome I of lower limb, bilateral: Secondary | ICD-10-CM

## 2018-08-18 DIAGNOSIS — M6281 Muscle weakness (generalized): Secondary | ICD-10-CM

## 2018-08-18 DIAGNOSIS — M79605 Pain in left leg: Secondary | ICD-10-CM | POA: Diagnosis present

## 2018-08-18 DIAGNOSIS — M79604 Pain in right leg: Secondary | ICD-10-CM | POA: Diagnosis present

## 2018-08-18 DIAGNOSIS — R262 Difficulty in walking, not elsewhere classified: Secondary | ICD-10-CM

## 2018-08-18 DIAGNOSIS — Z9181 History of falling: Secondary | ICD-10-CM | POA: Diagnosis present

## 2018-08-18 NOTE — Therapy (Signed)
[facility] [address] [place], [place], [address] Phone: [phone]   Fax:  [phone]  Physical Therapy Treatment  Patient Details  Name: Katie Neal MRN: [id number] Date of Birth: 03/23/1970 Referring Provider (PT): Hessie Dibble PA-C   Encounter Date: 08/18/2018  PT End of Session - 08/18/18 1409    Visit Number  12    Number of Visits  22    Date for PT Re-Evaluation  09/15/18    Authorization Type  2/10    Authorization - Visit Number  10    Authorization - Number of Visits  10    PT Start Time  1401    PT Stop Time  1427    PT Time Calculation (min)  26 min    Equipment Utilized During Treatment  Gait belt    Activity Tolerance  Patient limited by pain    Behavior During Therapy  [facility] for tasks assessed/performed;Restless;Anxious       Past Medical History:  Diagnosis Date  . Arthritis   . Asthma   . CRPS (complex regional pain syndrome type I)   . Depression   . IBS (irritable bowel syndrome)   . Insomnia   . PTSD (post-traumatic stress disorder)     Past Surgical History:  Procedure Laterality Date  . ABDOMINAL HYSTERECTOMY    . [place]  . APPENDECTOMY    . FOOT SURGERY  2002   due to deformity  . HIP SURGERY  2012  . KNEE ARTHROSCOPY    . LAPAROSCOPIC HYSTERECTOMY  2000  . REPAIR TENDONS LEG     x2    There were no vitals filed for this visit.  Subjective Assessment - 08/18/18 1402    Subjective  Patient presents to clinic 15 minutes late for her appointment. Patient reports that she has purchased and been using the [employer] App. Patient reports that despite cancelling previous appointment due to covid like symptoms she has not gotten tested and states that the s/s are from allergies. -    Pertinent History  Onset: 2015 patient broke 5th metatarsal which triggered CRPS. She found out she had osteopenia at the same time, and began taking Boniva which caused other  complications: GERD, hiatal hernia, among others.     Limitations  Lifting;Standing;Walking;House hold activities;Sitting    How long can you sit comfortably?  10-15 min    How long can you stand comfortably?  supported: <5 min; unsupported <5 min    How long can you walk comfortably?  w/ RW <5 min, without AD household distances    Patient Stated Goals  Regain strength, more mobility    Pain Onset  More than a month ago       TREATMENT  Neuromuscular Re-education: Reassessed goals. See below.  Sit to stand without UE use, 2x5. VCs for hip initiation and decreased reliance on anterior chain. Patient education on importance of consistency with HEP and appointments for maximum benefit.   Patient educated throughout session on appropriate technique and form using multi-modal cueing, HEP, and activity modification. Patient articulated understanding and returned demonstration.  Patient Response to interventions: Patient states that she feels better when she moves.   ASSESSMENT Patient presents to clinic to participate in therapy. Patient demonstrates deficits in pain coping skills, self-management, and internal locus of control, BLE strength and coordination as evidenced by increased reliance on external factors/individuals for improved adherence to pain management as well as pharmaceutical  management. Patient able to achieve 25 second improvement in 5 Times Sit to Stand during today's session, but continues to show no change in function according to FOTO (LEFS) measure. Patient's condition has mild potential to improve in response to therapy. Maximum improvement is yet to be obtained. The anticipated improvement is dependent on the patient's ability to participate in therapy consistently and perform HEP consistently. Patient will benefit from continued skilled therapeutic intervention to address remaining deficits in balance, BLE strength, pain coping skills, self-management, and internal locus of  control in order to improve independence, increase function, and improve overall QOL.     PT Long Term Goals - 08/18/18 1410      PT LONG TERM GOAL #1   Title  Patient will demonstrate improved function as evidenced by a FOTO score equal to or greater than 37/100 for improved safety and QOL.    Baseline  IE: 28/100; 06/18/2018: 28/100; 07/16/2018: 31/100; 08/18/2018: 28/100    Time  4    Period  Weeks    Status  On-going    Target Date  09/15/18      PT LONG TERM GOAL #2   Title  Patient will decrease 5TSTS by at least 3 seconds until patient has achieved normative ranges based on age in order to demonstrate clinically significant improvement in LE strength.    Baseline  IE: 48.2s with BUE support.  3/16: 42.1 seconds 5/27: 43.5 seconds; 07/16/2018: 57.3 seconds; 08/18/2018: 32.7 sec    Time  4    Period  Weeks    Status  On-going    Target Date  09/15/18      PT LONG TERM GOAL #3   Title  Pt will be independent with HEP in order to improve strength and balance in order to decrease fall risk and improve function at home and work.     Baseline  IE: not initiated.  3/16: pt. has HEP but limited compliance. 06/18/18: 2x/week; 07/16/2018: 1x/day; 08/18/2018: 2-3x/wk    Time  4    Period  Weeks    Status  Partially Met    Target Date  09/15/18      PT LONG TERM GOAL #4   Title  Patient will demonstrate improved balance as evidenced by her ability to stand unsupported for >8 min to allow for safe showering/bathing with improved independence.    Baseline  IE: 2:18/ requires supervision for showering at home.  3/16: 2 recent falls at home (posterior). 5/27: "maybe 1 fall since last visit"; 07/16/2018: 4 min standing; 08/18/2018: 5 min per patient report; patient notes that she has fallen since her last visit and reports loss of consciousness and waking up in bed with a bruise on her head, but does not recall getting to bed and denies being found by a family member    Time  4    Period  Weeks     Status  On-going    Target Date  09/15/18            Plan - 08/18/18 1625    Clinical Impression Statement  Patient presents to clinic to participate in therapy. Patient demonstrates deficits in pain coping skills, self-management, and internal locus of control, BLE strength and coordination as evidenced by increased reliance on external factors/individuals for improved adherence to pain management as well as Neurosurgeon. Patient able to achieve 25 second improvement in 5 Times Sit to Stand during today's session, but continues to show no change in function  according to FOTO (LEFS) measure. Patient's condition has mild potential to improve in response to therapy. Maximum improvement is yet to be obtained. The anticipated improvement is dependent on the patient's ability to participate in therapy consistently and perform HEP consistently. Patient will benefit from continued skilled therapeutic intervention to address remaining deficits in balance, BLE strength, pain coping skills, self-management, and internal locus of control in order to improve independence, increase function, and improve overall QOL.    Personal Factors and Comorbidities  Age;Education;Comorbidity 3+;Past/Current Experience;Time since onset of injury/illness/exacerbation;Social Background;Fitness    Comorbidities  arthritis, asthma, depression    Examination-Activity Limitations  Bathing;Hygiene/Grooming;Squat;Stairs;Lift;Bend;Locomotion Level;Stand;Reach Overhead;Carry;Sit;Dressing;Sleep    Examination-Participation Restrictions  Shop;Laundry;Cleaning;Meal Prep;Driving    Stability/Clinical Decision Making  Unstable/Unpredictable    Rehab Potential  Fair    PT Frequency  1x / week    PT Duration  4 weeks    PT Treatment/Interventions  ADLs/Self Care Home Management;Aquatic Therapy;Moist Heat;Electrical Stimulation;Gait training;Stair training;Functional mobility training;Therapeutic activities;Therapeutic  exercise;Balance training;Neuromuscular re-education;Patient/family education;Wheelchair mobility training;Manual techniques;Energy conservation;Taping    PT Next Visit Plan  PCST    PT Home Exercise Plan  Gentle joint mobility throughout BLE and lumbar; laterality training; graded activity (standing)    Consulted and Agree with Plan of Care  Patient       Patient will benefit from skilled therapeutic intervention in order to improve the following deficits and impairments:  Abnormal gait, Impaired sensation, Improper body mechanics, Pain, Postural dysfunction, Increased muscle spasms, Decreased mobility, Decreased coordination, Decreased activity tolerance, Decreased endurance, Cardiopulmonary status limiting activity, Decreased range of motion, Decreased strength, Hypomobility, Obesity, Difficulty walking, Decreased balance, Impaired perceived functional ability  Visit Diagnosis: 1. Complex regional pain syndrome type 1 of both lower extremities   2. Pain in both lower extremities   3. Muscle weakness (generalized)   4. Difficulty in walking, not elsewhere classified   5. History of falling        Problem List There are no active problems to display for this patient.  Myles Gip PT, DPT [phone] 08/18/2018, 4:31 PM  [place] [facility] [address] [place], [place], [address] Phone: [phone]   Fax:  [phone]  Name: Katie Neal MRN: [id number] Date of Birth: 09-22-1970

## 2018-08-25 ENCOUNTER — Encounter: Payer: Self-pay | Admitting: Physical Therapy

## 2018-08-25 ENCOUNTER — Ambulatory Visit: Payer: Medicare Other | Attending: Physician Assistant | Admitting: Physical Therapy

## 2018-08-25 ENCOUNTER — Other Ambulatory Visit: Payer: Self-pay

## 2018-08-25 DIAGNOSIS — M79605 Pain in left leg: Secondary | ICD-10-CM | POA: Insufficient documentation

## 2018-08-25 DIAGNOSIS — M6281 Muscle weakness (generalized): Secondary | ICD-10-CM

## 2018-08-25 DIAGNOSIS — M79604 Pain in right leg: Secondary | ICD-10-CM | POA: Diagnosis present

## 2018-08-25 DIAGNOSIS — Z9181 History of falling: Secondary | ICD-10-CM | POA: Insufficient documentation

## 2018-08-25 DIAGNOSIS — G90523 Complex regional pain syndrome I of lower limb, bilateral: Secondary | ICD-10-CM | POA: Insufficient documentation

## 2018-08-25 DIAGNOSIS — R262 Difficulty in walking, not elsewhere classified: Secondary | ICD-10-CM | POA: Diagnosis present

## 2018-08-25 NOTE — Therapy (Signed)
[facility] [address] [place], [place], [address] Phone: [phone]   Fax:  [phone]  Physical Therapy Treatment  Patient Details  Name: Katie Neal MRN: [id number] Date of Birth: 01-20-1971 Referring Provider (PT): Hessie Dibble PA-C   Encounter Date: 08/25/2018  PT End of Session - 08/25/18 1404    Visit Number  13    Number of Visits  22    Date for PT Re-Evaluation  09/15/18    Authorization Type  3/10    Authorization - Visit Number  3    Authorization - Number of Visits  10    PT Start Time  1400    PT Stop Time  1430    PT Time Calculation (min)  30 min    Equipment Utilized During Treatment  Gait belt    Activity Tolerance  Patient limited by pain    Behavior During Therapy  [facility] for tasks assessed/performed;Restless;Anxious       Past Medical History:  Diagnosis Date  . Arthritis   . Asthma   . CRPS (complex regional pain syndrome type I)   . Depression   . IBS (irritable bowel syndrome)   . Insomnia   . PTSD (post-traumatic stress disorder)     Past Surgical History:  Procedure Laterality Date  . ABDOMINAL HYSTERECTOMY    . [address]  . APPENDECTOMY    . FOOT SURGERY  2002   due to deformity  . HIP SURGERY  2012  . KNEE ARTHROSCOPY    . LAPAROSCOPIC HYSTERECTOMY  2000  . REPAIR TENDONS LEG     x2    There were no vitals filed for this visit.  Subjective Assessment - 08/25/18 1402    Subjective  Patient presents to clinic 15 minutes late for her appointment 2/2 to back pain. Patient states that she is unable to move quickly because of her back. She does walk into clinic with good symmetry using her rollator.    Pertinent History  Onset: 2015 patient broke 5th metatarsal which triggered CRPS. She found out she had osteopenia at the same time, and began taking Boniva which caused other complications: GERD, hiatal hernia, among others.     Limitations   Lifting;Standing;Walking;House hold activities;Sitting    How long can you sit comfortably?  10-15 min    How long can you stand comfortably?  supported: <5 min; unsupported <5 min    How long can you walk comfortably?  w/ RW <5 min, without AD household distances    Patient Stated Goals  Regain strength, more mobility    Pain Onset  More than a month ago       TREATMENT Therapeutic Exercise: Sit to stand, BUE on thigh support, 2x5 Standing B hip exercises in parallel bars:   Hip hinge x10  Hip abduction x10  Hip extension x10  Hamstring curl x10 Gentle seated pelvic tilts x10 Seated heel raises x15 with pronation block at ankles Seated hip adduction squeezes x10    Patient educated throughout session on appropriate technique and form using multi-modal cueing, HEP, and activity modification. Patient articulated understanding and returned demonstration.  Patient Response to interventions: Patient states "I'll pay for this later," which prompted re-education on graded exposure to activity.   ASSESSMENT Patient presents to clinic to participate in therapy. Patient demonstrates deficits in pain coping skills, self-management, and internal locus of control, BLE strength and coordination. Patient able to achieve increased activity  during today's session and gait was more symmetrical and smooth. Patient will benefit from continued skilled therapeutic intervention to address remaining deficits in balance, BLE strength, pain coping skills, self-management, and internal locus of control in order to improve independence, increase function, and improve overall QOL.        PT Long Term Goals - 08/18/18 1410      PT LONG TERM GOAL #1   Title  Patient will demonstrate improved function as evidenced by a FOTO score equal to or greater than 37/100 for improved safety and QOL.    Baseline  IE: 28/100; 06/18/2018: 28/100; 07/16/2018: 31/100; 08/18/2018: 28/100    Time  4    Period  Weeks     Status  On-going    Target Date  09/15/18      PT LONG TERM GOAL #2   Title  Patient will decrease 5TSTS by at least 3 seconds until patient has achieved normative ranges based on age in order to demonstrate clinically significant improvement in LE strength.    Baseline  IE: 48.2s with BUE support.  3/16: 42.1 seconds 5/27: 43.5 seconds; 07/16/2018: 57.3 seconds; 08/18/2018: 32.7 sec    Time  4    Period  Weeks    Status  On-going    Target Date  09/15/18      PT LONG TERM GOAL #3   Title  Pt will be independent with HEP in order to improve strength and balance in order to decrease fall risk and improve function at home and work.     Baseline  IE: not initiated.  3/16: pt. has HEP but limited compliance. 06/18/18: 2x/week; 07/16/2018: 1x/day; 08/18/2018: 2-3x/wk    Time  4    Period  Weeks    Status  Partially Met    Target Date  09/15/18      PT LONG TERM GOAL #4   Title  Patient will demonstrate improved balance as evidenced by her ability to stand unsupported for >8 min to allow for safe showering/bathing with improved independence.    Baseline  IE: 2:18/ requires supervision for showering at home.  3/16: 2 recent falls at home (posterior). 5/27: "maybe 1 fall since last visit"; 07/16/2018: 4 min standing; 08/18/2018: 5 min per patient report; patient notes that she has fallen since her last visit and reports loss of consciousness and waking up in bed with a bruise on her head, but does not recall getting to bed and denies being found by a family member    Time  4    Period  Weeks    Status  On-going    Target Date  09/15/18            Plan - 08/25/18 1406    Clinical Impression Statement  Patient presents to clinic to participate in therapy. Patient demonstrates deficits in pain coping skills, self-management, and internal locus of control, BLE strength and coordination. Patient able to achieve increased activity during today's session and gait was more symmetrical and smooth. Patient  will benefit from continued skilled therapeutic intervention to address remaining deficits in balance, BLE strength, pain coping skills, self-management, and internal locus of control in order to improve independence, increase function, and improve overall QOL.    Personal Factors and Comorbidities  Age;Education;Comorbidity 3+;Past/Current Experience;Time since onset of injury/illness/exacerbation;Social Background;Fitness    Comorbidities  arthritis, asthma, depression    Examination-Activity Limitations  Bathing;Hygiene/Grooming;Squat;Stairs;Lift;Bend;Locomotion Level;Stand;Reach Overhead;Carry;Sit;Dressing;Sleep    Examination-Participation Restrictions  Shop;Laundry;Cleaning;Meal Prep;Driving  Stability/Clinical Decision Making  Unstable/Unpredictable    Rehab Potential  Fair    PT Frequency  1x / week    PT Duration  4 weeks    PT Treatment/Interventions  ADLs/Self Care Home Management;Aquatic Therapy;Moist Heat;Electrical Stimulation;Gait training;Stair training;Functional mobility training;Therapeutic activities;Therapeutic exercise;Balance training;Neuromuscular re-education;Patient/family education;Wheelchair mobility training;Manual techniques;Energy conservation;Taping    PT Next Visit Plan  PCST    PT Home Exercise Plan  Gentle joint mobility throughout BLE and lumbar; laterality training; graded activity (standing)    Consulted and Agree with Plan of Care  Patient       Patient will benefit from skilled therapeutic intervention in order to improve the following deficits and impairments:  Abnormal gait, Impaired sensation, Improper body mechanics, Pain, Postural dysfunction, Increased muscle spasms, Decreased mobility, Decreased coordination, Decreased activity tolerance, Decreased endurance, Cardiopulmonary status limiting activity, Decreased range of motion, Decreased strength, Hypomobility, Obesity, Difficulty walking, Decreased balance, Impaired perceived functional  ability  Visit Diagnosis: 1. Complex regional pain syndrome type 1 of both lower extremities   2. Pain in both lower extremities   3. Muscle weakness (generalized)   4. Difficulty in walking, not elsewhere classified   5. History of falling        Problem List There are no active problems to display for this patient.  Myles Gip PT, DPT [phone] 08/25/2018, 5:18 PM  [facility] [address] [place], [place], [address] Phone: [phone]   Fax:  [phone]  Name: KARLEIGH BUNTE MRN: [id number] Date of Birth: [birth year]

## 2018-09-01 ENCOUNTER — Ambulatory Visit: Payer: Medicare Other | Admitting: Physical Therapy

## 2018-09-08 ENCOUNTER — Encounter: Payer: Medicare Other | Admitting: Physical Therapy

## 2018-09-15 ENCOUNTER — Encounter: Payer: Medicare Other | Admitting: Physical Therapy

## 2018-09-16 ENCOUNTER — Ambulatory Visit: Payer: Medicare Other | Admitting: Physical Therapy

## 2018-09-18 ENCOUNTER — Ambulatory Visit: Payer: Medicare Other | Admitting: Physical Therapy

## 2018-09-22 ENCOUNTER — Encounter: Payer: Self-pay | Admitting: Physical Therapy

## 2018-09-22 ENCOUNTER — Other Ambulatory Visit: Payer: Self-pay

## 2018-09-22 ENCOUNTER — Ambulatory Visit: Payer: Medicare Other | Admitting: Physical Therapy

## 2018-09-22 DIAGNOSIS — G90523 Complex regional pain syndrome I of lower limb, bilateral: Secondary | ICD-10-CM

## 2018-09-22 DIAGNOSIS — R262 Difficulty in walking, not elsewhere classified: Secondary | ICD-10-CM

## 2018-09-22 DIAGNOSIS — M79605 Pain in left leg: Secondary | ICD-10-CM

## 2018-09-22 DIAGNOSIS — Z9181 History of falling: Secondary | ICD-10-CM

## 2018-09-22 DIAGNOSIS — M79604 Pain in right leg: Secondary | ICD-10-CM

## 2018-09-22 DIAGNOSIS — M6281 Muscle weakness (generalized): Secondary | ICD-10-CM

## 2018-09-22 NOTE — Therapy (Signed)
[facility] [address] [place], [place], [address] Phone: [phone]   Fax:  [phone]  Physical Therapy Treatment  Patient Details  Name: Katie Neal MRN: [id number] Date of Birth: Sep 26, 1970 Referring Provider (PT): Hessie Dibble PA-C   Encounter Date: 09/22/2018  PT End of Session - 09/22/18 1414    Visit Number  14    Number of Visits  22    Date for PT Re-Evaluation  09/15/18    Authorization Type  4/10    Authorization - Visit Number  4    Authorization - Number of Visits  10    PT Start Time  2111    PT Stop Time  1430    PT Time Calculation (min)  17 min    Equipment Utilized During Treatment  Gait belt    Activity Tolerance  Patient limited by pain    Behavior During Therapy  [facility] for tasks assessed/performed;Restless       Past Medical History:  Diagnosis Date  . Arthritis   . Asthma   . CRPS (complex regional pain syndrome type I)   . Depression   . IBS (irritable bowel syndrome)   . Insomnia   . PTSD (post-traumatic stress disorder)     Past Surgical History:  Procedure Laterality Date  . ABDOMINAL HYSTERECTOMY    . [place]  . APPENDECTOMY    . FOOT SURGERY  2002   due to deformity  . HIP SURGERY  2012  . KNEE ARTHROSCOPY    . LAPAROSCOPIC HYSTERECTOMY  2000  . REPAIR TENDONS LEG     x2    There were no vitals filed for this visit.  Subjective Assessment - 09/22/18 1413    Subjective  Patient enters clinic with rollator, symmetrical stride and relatively erect posture approximately 30 minutes late for her scheduled appointment. Patient states "My knees have been sh!t since we did all that stuff" referencing appointment on 08/25/2018.    Pertinent History  Onset: 2015 patient broke 5th metatarsal which triggered CRPS. She found out she had osteopenia at the same time, and began taking Boniva which caused other complications: GERD, hiatal hernia, among others.     Limitations  Lifting;Standing;Walking;House hold activities;Sitting    How long can you sit comfortably?  10-15 min    How long can you stand comfortably?  supported: <5 min; unsupported <5 min    How long can you walk comfortably?  w/ RW <5 min, without AD household distances    Patient Stated Goals  Regain strength, more mobility    Pain Onset  More than a month ago         TREATMENT  Neuromuscular Re-education: Reassessed goals; see below. Discussed benefits of approaching treatment via other disciplines (primarily psychological counseling). Patient articulated agreement.    ASSESSMENT Patient presents to clinic to participate in therapy. Patient demonstrates deficits in pain coping skills, self-management, and internal locus of control, BLE strength and coordination. Patient unable to achieve goals outlined on evaluation and demonstrates pain wind-up in response to gentle exercise and movement. Patient educated again during today's session on the potential benefits of psychological intervention for improved pain management. Patient agreed with plan for discharge due to absence of consistent or significant progress in relationship to deficits in pain coping skills, self-management, and internal locus of control, BLE strength and coordination.         PT Long Term Goals - 09/22/18 1416  PT LONG TERM GOAL #1   Title  Patient will demonstrate improved function as evidenced by a FOTO score equal to or greater than 37/100 for improved safety and QOL.    Baseline  IE: 28/100; 06/18/2018: 28/100; 07/16/2018: 31/100; 08/18/2018: 28/100; 09/22/2018: 26/100    Time  4    Period  Weeks    Status  Not Met      PT LONG TERM GOAL #2   Title  Patient will decrease 5TSTS by at least 3 seconds until patient has achieved normative ranges based on age in order to demonstrate clinically significant improvement in LE strength.    Baseline  IE: 48.2s with BUE support.  3/16: 42.1 seconds 5/27:  43.5 seconds; 07/16/2018: 57.3 seconds; 08/18/2018: 32.7 sec; 09/22/2018: 37.0 sec with BUE support    Time  4    Period  Weeks    Status  On-going      PT LONG TERM GOAL #3   Title  Pt will be independent with HEP in order to improve strength and balance in order to decrease fall risk and improve function at home and work.     Baseline  IE: not initiated.  3/16: pt. has HEP but limited compliance. 06/18/18: 2x/week; 07/16/2018: 1x/day; 08/18/2018: 2-3x/wk; 09/22/2018: 2x/wk to daily, no consistency reported    Time  4    Period  Weeks    Status  Not Met      PT LONG TERM GOAL #4   Title  Patient will demonstrate improved balance as evidenced by her ability to stand unsupported for >8 min to allow for safe showering/bathing with improved independence.    Baseline  IE: 2:18/ requires supervision for showering at home.  3/16: 2 recent falls at home (posterior). 5/27: "maybe 1 fall since last visit"; 07/16/2018: 4 min standing; 08/18/2018: 5 min per patient report; patient notes that she has fallen since her last visit and reports loss of consciousness and waking up in bed with a bruise on her head, but does not recall getting to bed and denies being found by a family member; 09/22/2018: 2 falls; with dizziness "when my vertigo was acting up"    Time  4    Period  Weeks    Status  Not Met            Plan - 09/22/18 1415    Clinical Impression Statement  Patient presents to clinic to participate in therapy. Patient demonstrates deficits in pain coping skills, self-management, and internal locus of control, BLE strength and coordination. Patient unable to achieve goals outlined on evaluation and demonstrates pain wind-up in response to gentle exercise and movement. Patient educated again during today's session on the potential benefits of psychological intervention for improved pain management. Patient agreed with plan for discharge due to absence of consistent or significant progress in relationship to  deficits in pain coping skills, self-management, and internal locus of control, BLE strength and coordination.    Personal Factors and Comorbidities  Age;Education;Comorbidity 3+;Past/Current Experience;Time since onset of injury/illness/exacerbation;Social Background;Fitness    Comorbidities  arthritis, asthma, depression    Examination-Activity Limitations  Bathing;Hygiene/Grooming;Squat;Stairs;Lift;Bend;Locomotion Level;Stand;Reach Overhead;Carry;Sit;Dressing;Sleep    Examination-Participation Restrictions  Shop;Laundry;Cleaning;Meal Prep;Driving    Stability/Clinical Decision Making  Unstable/Unpredictable    Rehab Potential  Poor    PT Frequency  One time visit    PT Treatment/Interventions  ADLs/Self Care Home Management;Aquatic Therapy;Moist Heat;Electrical Stimulation;Gait training;Stair training;Functional mobility training;Therapeutic activities;Therapeutic exercise;Balance training;Neuromuscular re-education;Patient/family education;Wheelchair mobility training;Manual techniques;Energy conservation;Taping  PT Next Visit Plan  PCST    PT Home Exercise Plan  Gentle joint mobility throughout BLE and lumbar; laterality training; graded activity (standing)    Consulted and Agree with Plan of Care  Patient       Patient will benefit from skilled therapeutic intervention in order to improve the following deficits and impairments:  Abnormal gait, Impaired sensation, Improper body mechanics, Pain, Postural dysfunction, Increased muscle spasms, Decreased mobility, Decreased coordination, Decreased activity tolerance, Decreased endurance, Cardiopulmonary status limiting activity, Decreased range of motion, Decreased strength, Hypomobility, Obesity, Difficulty walking, Decreased balance, Impaired perceived functional ability  Visit Diagnosis: Complex regional pain syndrome type 1 of both lower extremities  Pain in both lower extremities  Muscle weakness (generalized)  Difficulty in walking,  not elsewhere classified  History of falling     Problem List There are no active problems to display for this patient.  Myles Gip PT, DPT [phone] 09/22/2018, 2:43 PM  [facility] [address] [place], [place], [address] Phone: [phone]   Fax:  [phone]  Name: ANIKKA MARSAN MRN: [id number] Date of Birth: 05-11-1970

## 2018-10-28 ENCOUNTER — Ambulatory Visit: Payer: Medicare Other | Attending: Internal Medicine

## 2018-10-28 DIAGNOSIS — G4733 Obstructive sleep apnea (adult) (pediatric): Secondary | ICD-10-CM | POA: Insufficient documentation

## 2018-10-29 ENCOUNTER — Other Ambulatory Visit: Payer: Self-pay

## 2018-12-12 ENCOUNTER — Other Ambulatory Visit: Admission: RE | Admit: 2018-12-12 | Payer: Medicare Other | Source: Ambulatory Visit

## 2018-12-23 ENCOUNTER — Other Ambulatory Visit: Payer: Self-pay | Admitting: Orthopedic Surgery

## 2018-12-23 ENCOUNTER — Ambulatory Visit: Payer: Medicare Other | Attending: Internal Medicine

## 2018-12-23 DIAGNOSIS — M5442 Lumbago with sciatica, left side: Secondary | ICD-10-CM

## 2018-12-23 DIAGNOSIS — G4733 Obstructive sleep apnea (adult) (pediatric): Secondary | ICD-10-CM | POA: Insufficient documentation

## 2018-12-23 DIAGNOSIS — M461 Sacroiliitis, not elsewhere classified: Secondary | ICD-10-CM

## 2018-12-24 ENCOUNTER — Other Ambulatory Visit: Payer: Self-pay

## 2018-12-30 ENCOUNTER — Emergency Department: Payer: Medicare Other

## 2018-12-30 ENCOUNTER — Inpatient Hospital Stay
Admission: EM | Admit: 2018-12-30 | Discharge: 2019-01-01 | DRG: 689 | Disposition: A | Discharge status: Home Health | Payer: Medicare Other | Attending: Internal Medicine | Admitting: Internal Medicine

## 2018-12-30 ENCOUNTER — Encounter: Payer: Self-pay | Admitting: Emergency Medicine

## 2018-12-30 ENCOUNTER — Other Ambulatory Visit: Payer: Self-pay

## 2018-12-30 DIAGNOSIS — R0902 Hypoxemia: Secondary | ICD-10-CM | POA: Diagnosis present

## 2018-12-30 DIAGNOSIS — Z20828 Contact with and (suspected) exposure to other viral communicable diseases: Secondary | ICD-10-CM | POA: Diagnosis present

## 2018-12-30 DIAGNOSIS — Z79891 Long term (current) use of opiate analgesic: Secondary | ICD-10-CM

## 2018-12-30 DIAGNOSIS — R569 Unspecified convulsions: Secondary | ICD-10-CM

## 2018-12-30 DIAGNOSIS — E785 Hyperlipidemia, unspecified: Secondary | ICD-10-CM | POA: Diagnosis present

## 2018-12-30 DIAGNOSIS — Z90710 Acquired absence of both cervix and uterus: Secondary | ICD-10-CM

## 2018-12-30 DIAGNOSIS — J189 Pneumonia, unspecified organism: Secondary | ICD-10-CM | POA: Diagnosis not present

## 2018-12-30 DIAGNOSIS — K589 Irritable bowel syndrome without diarrhea: Secondary | ICD-10-CM | POA: Diagnosis present

## 2018-12-30 DIAGNOSIS — R531 Weakness: Secondary | ICD-10-CM

## 2018-12-30 DIAGNOSIS — G4700 Insomnia, unspecified: Secondary | ICD-10-CM | POA: Diagnosis present

## 2018-12-30 DIAGNOSIS — Z885 Allergy status to narcotic agent status: Secondary | ICD-10-CM

## 2018-12-30 DIAGNOSIS — N390 Urinary tract infection, site not specified: Principal | ICD-10-CM

## 2018-12-30 DIAGNOSIS — Z7951 Long term (current) use of inhaled steroids: Secondary | ICD-10-CM

## 2018-12-30 DIAGNOSIS — W19XXXA Unspecified fall, initial encounter: Secondary | ICD-10-CM | POA: Diagnosis present

## 2018-12-30 DIAGNOSIS — F4310 Post-traumatic stress disorder, unspecified: Secondary | ICD-10-CM | POA: Diagnosis present

## 2018-12-30 DIAGNOSIS — J45909 Unspecified asthma, uncomplicated: Secondary | ICD-10-CM | POA: Diagnosis present

## 2018-12-30 DIAGNOSIS — F418 Other specified anxiety disorders: Secondary | ICD-10-CM | POA: Diagnosis present

## 2018-12-30 DIAGNOSIS — F32A Depression, unspecified: Secondary | ICD-10-CM | POA: Diagnosis present

## 2018-12-30 DIAGNOSIS — G9050 Complex regional pain syndrome I, unspecified: Secondary | ICD-10-CM | POA: Diagnosis present

## 2018-12-30 DIAGNOSIS — Z79899 Other long term (current) drug therapy: Secondary | ICD-10-CM

## 2018-12-30 DIAGNOSIS — W1830XA Fall on same level, unspecified, initial encounter: Secondary | ICD-10-CM | POA: Diagnosis present

## 2018-12-30 DIAGNOSIS — R296 Repeated falls: Secondary | ICD-10-CM | POA: Diagnosis present

## 2018-12-30 DIAGNOSIS — E039 Hypothyroidism, unspecified: Secondary | ICD-10-CM | POA: Diagnosis present

## 2018-12-30 DIAGNOSIS — N3000 Acute cystitis without hematuria: Secondary | ICD-10-CM | POA: Diagnosis not present

## 2018-12-30 DIAGNOSIS — Z79890 Hormone replacement therapy (postmenopausal): Secondary | ICD-10-CM

## 2018-12-30 DIAGNOSIS — B9620 Unspecified Escherichia coli [E. coli] as the cause of diseases classified elsewhere: Secondary | ICD-10-CM | POA: Diagnosis present

## 2018-12-30 DIAGNOSIS — Z87891 Personal history of nicotine dependence: Secondary | ICD-10-CM

## 2018-12-30 LAB — URINALYSIS, COMPLETE (UACMP) WITH MICROSCOPIC
Bilirubin Urine: NEGATIVE
Glucose, UA: NEGATIVE mg/dL
Hgb urine dipstick: NEGATIVE
Ketones, ur: 5 mg/dL — AB
Nitrite: NEGATIVE
Protein, ur: 30 mg/dL — AB
Specific Gravity, Urine: 1.015 (ref 1.005–1.030)
WBC, UA: >50 WBC/hpf — ABNORMAL HIGH (ref 0–5)
pH: 7 (ref 5.0–8.0)

## 2018-12-30 LAB — CBC
HCT: 38.4 % (ref 36.0–46.0)
Hemoglobin: 13.7 g/dL (ref 12.0–15.0)
MCH: 29.7 pg (ref 26.0–34.0)
MCHC: 35.7 g/dL (ref 30.0–36.0)
MCV: 83.1 fL (ref 80.0–100.0)
Platelets: 206 10*3/uL (ref 150–400)
RBC: 4.62 MIL/uL (ref 3.87–5.11)
RDW: 12.1 % (ref 11.5–15.5)
WBC: 10.1 10*3/uL (ref 4.0–10.5)
nRBC: 0 % (ref 0.0–0.2)

## 2018-12-30 LAB — BASIC METABOLIC PANEL
Anion gap: 17 — ABNORMAL HIGH (ref 5–15)
BUN: 14 mg/dL (ref 6–20)
CO2: 20 mmol/L — ABNORMAL LOW (ref 22–32)
Calcium: 9.2 mg/dL (ref 8.9–10.3)
Chloride: 99 mmol/L (ref 98–111)
Creatinine, Ser: 1.04 mg/dL — ABNORMAL HIGH (ref 0.44–1.00)
GFR calc Af Amer: >60 mL/min (ref >60)
GFR calc non Af Amer: >60 mL/min (ref >60)
Glucose, Bld: 126 mg/dL — ABNORMAL HIGH (ref 70–99)
Potassium: 3.7 mmol/L (ref 3.5–5.1)
Sodium: 136 mmol/L (ref 135–145)

## 2018-12-30 LAB — SARS CORONAVIRUS 2 (TAT 6-24 HRS): SARS Coronavirus 2: NEGATIVE

## 2018-12-30 MED ORDER — ONDANSETRON HCL 4 MG/2ML IJ SOLN
INTRAMUSCULAR | Status: AC
  Administered 2018-12-30: 4 mg via INTRAVENOUS
  Filled 2018-12-30: qty 2

## 2018-12-30 MED ORDER — ACETAMINOPHEN 325 MG PO TABS
650.0000 mg | ORAL_TABLET | Freq: Four times a day (QID) | ORAL | Status: DC | PRN
  Administered 2018-12-30 – 2019-01-01 (×3): 650 mg via ORAL
  Filled 2018-12-30 (×3): qty 2

## 2018-12-30 MED ORDER — SODIUM CHLORIDE 0.9 % IV SOLN
2.0000 g | INTRAVENOUS | Status: DC
  Administered 2018-12-30 – 2018-12-31 (×2): 2 g via INTRAVENOUS
  Filled 2018-12-30 (×3): qty 20
  Filled 2018-12-30: qty 2

## 2018-12-30 MED ORDER — DM-GUAIFENESIN ER 30-600 MG PO TB12: 1.0000 | ORAL_TABLET | Freq: Two times a day (BID) | ORAL | Status: DC

## 2018-12-30 MED ORDER — SODIUM CHLORIDE 0.9 % IV SOLN
500.0000 mg | INTRAVENOUS | Status: DC
  Administered 2018-12-30 – 2018-12-31 (×2): 500 mg via INTRAVENOUS
  Filled 2018-12-30 (×5): qty 500

## 2018-12-30 MED ORDER — IPRATROPIUM BROMIDE HFA 17 MCG/ACT IN AERS
2.0000 | INHALATION_SPRAY | RESPIRATORY_TRACT | Status: DC
  Administered 2018-12-30 – 2019-01-01 (×11): 2 via RESPIRATORY_TRACT
  Filled 2018-12-30: qty 12.9

## 2018-12-30 MED ORDER — ALBUTEROL SULFATE HFA 108 (90 BASE) MCG/ACT IN AERS
2.0000 | INHALATION_SPRAY | RESPIRATORY_TRACT | Status: DC | PRN
  Administered 2018-12-30: 2 via RESPIRATORY_TRACT
  Filled 2018-12-30: qty 6.7

## 2018-12-30 MED ORDER — GUAIFENESIN ER 600 MG PO TB12
600.0000 mg | ORAL_TABLET | Freq: Two times a day (BID) | ORAL | Status: DC
  Administered 2018-12-30 – 2019-01-01 (×5): 600 mg via ORAL
  Filled 2018-12-30 (×6): qty 1

## 2018-12-30 MED ORDER — DEXTROMETHORPHAN POLISTIREX ER 30 MG/5ML PO SUER
30.0000 mg | Freq: Two times a day (BID) | ORAL | Status: DC
  Administered 2018-12-30 – 2019-01-01 (×3): 30 mg via ORAL
  Filled 2018-12-30 (×9): qty 5

## 2018-12-30 MED ORDER — SODIUM CHLORIDE 0.9% FLUSH
3.0000 mL | Freq: Once | INTRAVENOUS | Status: AC
  Administered 2018-12-30: 3 mL via INTRAVENOUS

## 2018-12-30 MED ORDER — ONDANSETRON HCL 4 MG/2ML IJ SOLN
4.0000 mg | Freq: Three times a day (TID) | INTRAMUSCULAR | Status: DC | PRN
  Administered 2018-12-30 – 2018-12-31 (×4): 4 mg via INTRAVENOUS
  Filled 2018-12-30 (×3): qty 2

## 2018-12-30 MED ORDER — SODIUM CHLORIDE 0.9 % IV BOLUS
1000.0000 mL | Freq: Once | INTRAVENOUS | Status: AC
  Administered 2018-12-30: 1000 mL via INTRAVENOUS

## 2018-12-30 MED ORDER — SODIUM CHLORIDE 0.9 % IV SOLN: 1.0000 g | Freq: Once | INTRAVENOUS | Status: DC

## 2018-12-30 MED ORDER — LORAZEPAM 2 MG/ML IJ SOLN: 1.0000 mg | INTRAMUSCULAR | Status: DC | PRN

## 2018-12-30 NOTE — ED Notes (Signed)
Pt assisted to the bathroom by this RN and Tilford Pillar, Careers adviser. Pt did not tolerate well. Pt to turn and pivot and take several steps with this RN as stand by assist. Pt then began hyperventilating and crying while on toilet yelling, "please help me, please help me, please help me!" UA obtained by this RN at this time. Pt back into wheelchair without incident and back to lobby.

## 2018-12-30 NOTE — ED Triage Notes (Signed)
Pt presents to ED via [facility] with c/o generalized weakness, increased falls. Per EMS pt has had several fall over the last few days, fell yesterday and hurt her R wrist, pt's wrist is marked by family PTA to not use. Per EMS pt has congested cough, but has hx of COPD, pt also c/o dizziness but has hx of vertigo, pt also has complex regional pain syndrome and per family pt's permission must be obtained prior to touching her due to pain.  Per EMS VSS en route. Pt presents alert but with eyes closed upon arrival to triage.

## 2018-12-30 NOTE — Progress Notes (Signed)
CODE SEPSIS - PHARMACY COMMUNICATION  **Broad Spectrum Antibiotics should be administered within 1 hour of Sepsis diagnosis**  Time Code Sepsis Called/Page Received: 1418 (consult, no page)  Antibiotics Ordered: ceftriaxone and azithromycin  Time of 1st antibiotic administration: 1518  Additional action taken by pharmacy: n/a  If necessary, Name of Provider/Nurse Contacted: n/a    Rocky Morel ,PharmD Clinical Pharmacist  12/30/2018  3:38 PM

## 2018-12-30 NOTE — ED Notes (Addendum)
Pt assisted from bed to commode via pivot. Pt extremely anxious. Amy RN assisting. Pt urinated and provided self peri care. Given nonslip socks.

## 2018-12-30 NOTE — H&P (Signed)
History and Physical    Katie Neal:[id number] DOB: March 02, 1970 DOA: 12/30/2018  Referring MD/NP/PA:   PCP: Sharyne Peach, MD   Patient coming from:  The patient is coming from home.  At baseline, pt is independent for most of ADL.        Chief Complaint: Generalized weakness, multiple falls, dysuria  HPI: Katie Neal is a 48 y.o. female with medical history significant of CRPS (complex regional pain syndrome type I), hyperlipidemia, asthma, hypothyroidism, depression, anxiety, PTSD, IBS, insomnia, seizure, vertigo, who presents with generalized weakness, multiple falls, dysuria  Patient states that she has been having generalized weakness which has been going on for several days, progressively worsening.  She fell several times without significant injury except for right wrist pain.  She also reports dizziness and has history of vertigo.  She has cough and shortness of breath, no chest pain.  She has dysuria, no burning on urination.  No facial droop, slurred speech.  No unilateral numbness or tingling to extremities.  ED Course: pt was found to have negative Covid Ag, pending Covid PCR, positive UA for UTI, WBC 10.1, electrolytes and renal function okay, temperature 98.6, blood pressure 137/104, heart rate in 90s, RR 22, oxygen saturation 97% on room air.  Chest x-ray showed a bilateral basilar infiltration.  X-ray of right wrist is negative.  CT head is negative for acute intracranial abnormalities.  Review of Systems:   General: no fevers, chills, no body weight gain, has poor appetite, has fatigue HEENT: no blurry vision, hearing changes or sore throat Respiratory: has dyspnea, coughing, no wheezing CV: no chest pain, no palpitations GI: no nausea, vomiting, abdominal pain, diarrhea, constipation GU: has dysuria, no burning on urination, increased urinary frequency, hematuria  Ext: no leg edema Neuro: no unilateral weakness, numbness, or tingling, no vision change  or hearing loss Skin: no rash, no skin tear. MSK: No muscle spasm, no deformity, no limitation of range of movement in spin. Pain all over. Heme: No easy bruising.  Travel history: No recent long distant travel.  Allergy:  Allergies  Allergen Reactions  . Codeine Itching    Past Medical History:  Diagnosis Date  . Arthritis   . Asthma   . CRPS (complex regional pain syndrome type I)   . Depression   . IBS (irritable bowel syndrome)   . Insomnia   . PTSD (post-traumatic stress disorder)     Past Surgical History:  Procedure Laterality Date  . ABDOMINAL HYSTERECTOMY    . [place]  . APPENDECTOMY    . FOOT SURGERY  2002   due to deformity  . HIP SURGERY  2012  . KNEE ARTHROSCOPY    . LAPAROSCOPIC HYSTERECTOMY  2000  . REPAIR TENDONS LEG     x2    Social History:  reports that she has quit smoking. Her smoking use included cigarettes. She has never used smokeless tobacco. She reports that she does not drink alcohol or use drugs.  Family History:  Family History  Problem Relation Age of Onset  . Diabetes Mellitus II Father      Prior to Admission medications   Medication Sig Start Date End Date Taking? Authorizing Provider  albuterol (PROVENTIL HFA;VENTOLIN HFA) 108 (90 Base) MCG/ACT inhaler Inhale 2 puffs into the lungs every 6 (six) hours as needed for wheezing. 12/06/15   [provider]  ALPRAZolam (XANAX) 1 MG tablet TAKE TWO (2) TABLETS BY MOUTH TWICE DAILY. Patient not  taking: Reported on 03/22/2016 12/14/11   Godfrey PickEgan, Eleanore E, PA-C  amitriptyline (ELAVIL) 10 MG tablet Take 25 mg by mouth at bedtime.     [provider]  Ascorbic Acid (VITAMIN C WITH ROSE HIPS) 1000 MG tablet Take 1,000 mg by mouth 2 (two) times daily.    [provider]  baclofen (LIORESAL) 10 MG tablet Take 10 mg by mouth 3 (three) times daily. 02/27/14   [provider]  budesonide (PULMICORT) 0.25 MG/2ML nebulizer solution Take 0.25 mg by  nebulization once.    [provider]  Cholecalciferol (VITAMIN D-1000 MAX ST) 1000 units tablet Take 5,000 Units by mouth daily.    [provider]  dicyclomine (BENTYL) 20 MG tablet Take 20 mg by mouth 3 (three) times daily as needed for pain. 08/30/14   [provider]  doxepin (SINEQUAN) 10 MG capsule TAKE ONE CAPSULE BY MOUTH AT BEDTIME Patient not taking: Reported on 02/24/2018 01/04/12   Sondra Bargesunn, Ryan M, PA-C  fluticasone [facility](FLONASE) 50 MCG/ACT nasal spray Place 2 sprays into both nostrils daily.    [provider]  gabapentin (NEURONTIN) 600 MG tablet Take 1,200 mg by mouth 3 (three) times daily.     [provider]  Gabapentin Enacarbil (HORIZANT) 600 MG TBCR Take 600 tablets by mouth 2 (two) times daily.    [provider]  hydroxychloroquine (PLAQUENIL) 200 MG tablet Take 200 mg by mouth 2 (two) times daily.    [provider]  hydrOXYzine (ATARAX/VISTARIL) 25 MG tablet Take 25 mg by mouth 3 (three) times daily as needed.    [provider]  lamoTRIgine (LAMICTAL) 200 MG tablet Take 200 mg by mouth daily. 03/14/16 06/12/16  [provider]  lamoTRIgine 100 MG TBDP Take 4 tablets by mouth once.    [provider]  levETIRAcetam (KEPPRA) 750 MG tablet Take 750 mg by mouth 2 (two) times daily. 03/01/16 03/01/17  [provider]  levorphanol (LEVODROMORAN) 2 MG tablet Take 2 mg by mouth every 6 (six) hours as needed for pain.    [provider]  levothyroxine (SYNTHROID, LEVOTHROID) 137 MCG tablet Take 137 mcg by mouth daily. 11/10/15   [provider]  metoprolol succinate (TOPROL-XL) 25 MG 24 hr tablet Take 25 mg by mouth daily. 07/05/15 02/24/18  [provider]  montelukast (SINGULAIR) 10 MG tablet Take 10 mg by mouth daily. 12/06/15   [provider]  morphine (MS CONTIN) 30 MG 12 hr tablet Take 30 mg by mouth every 12 (twelve) hours. 02/13/16   [provider]   morphine (MSIR) 15 MG tablet Take 15 mg by mouth every 12 (twelve) hours. 02/13/16   [provider]  Olopatadine HCl (PAZEO) 0.7 % SOLN Place 1 drop into both eyes daily as needed.    [provider]  ondansetron (ZOFRAN-ODT) 4 MG disintegrating tablet Take 4 mg by mouth every 8 (eight) hours as needed for nausea. 01/13/15   [provider]  pantoprazole (PROTONIX) 40 MG tablet Take 40 mg by mouth daily.    [provider]  simvastatin (ZOCOR) 20 MG tablet Take 20 mg by mouth daily. 03/12/14   [provider]  SUMAtriptan (IMITREX) 100 MG tablet Take 1 tablet (100 mg total) by mouth once as needed for migraine. Patient not taking: Reported on 03/22/2016 06/14/11   Lamar LaundryMayans, Laura C, MD  Tiotropium Bromide Monohydrate 2.5 MCG/ACT AERS Inhale 5 mcg into the lungs daily. 11/18/15   [provider]  traZODone (DESYREL) 100 MG tablet Take 2 tablets (200 mg total) by mouth at bedtime. Needs office visit 12/14/11   Nelva Nay, PA-C  venlafaxine XR (EFFEXOR-XR) 75 MG 24 hr capsule Take 3 capsules (225 mg total) by mouth daily. 06/14/11   Mayans, Assunta Gambles, MD  vitamin B-12 (CYANOCOBALAMIN) 1000 MCG tablet Take 1,000 mcg by mouth daily.    [provider]  vitamin E 400 UNIT capsule Take 400 Units by mouth daily.    [provider]  zolpidem (AMBIEN CR) 12.5 MG CR tablet Take 1 tablet (12.5 mg total) by mouth at bedtime as needed for sleep. Patient not taking: Reported on 03/22/2016 10/16/15 10/15/16  Emily Filbert, MD    Physical Exam: Vitals:   12/30/18 1530 12/30/18 1630 12/30/18 1724 12/30/18 2000  BP: (!) 143/94 140/81 133/60 134/68  Pulse: 90 98 91   Resp: (!) 23 (!) 21 19   Temp:      TempSrc:      SpO2: 94% 98% 100% 99%  Weight:      Height:       General: Not in acute distress HEENT:       Eyes: PERRL, EOMI, no scleral icterus.       ENT: No discharge from the ears and nose, no pharynx injection, no tonsillar  enlargement.        Neck: No JVD, no bruit, no mass felt. Heme: No neck lymph node enlargement. Cardiac: S1/S2, RRR, No murmurs, No gallops or rubs. Respiratory:  No rales, wheezing, rhonchi or rubs. GI: Soft, nondistended, nontender, no rebound pain, no organomegaly, BS present. GU: No hematuria Ext: No pitting leg edema bilaterally. 2+DP/PT pulse bilaterally. Musculoskeletal: No joint deformities, No joint redness or warmth, no limitation of ROM in spin. Tenderness all over Skin: No rashes.  Neuro: Alert, oriented X3, cranial nerves II-XII grossly intact, moves all extremities normally. Psych: Patient is not psychotic, no suicidal or hemocidal ideation.  Labs on Admission: I have personally reviewed following labs and imaging studies  CBC: Recent Labs  Lab 12/30/18 1239  WBC 10.1  HGB 13.7  HCT 38.4  MCV 83.1  PLT 206   Basic Metabolic Panel: Recent Labs  Lab 12/30/18 1239  NA 136  K 3.7  CL 99  CO2 20*  GLUCOSE 126*  BUN 14  CREATININE 1.04*  CALCIUM 9.2   GFR: Estimated Creatinine Clearance: 75.9 mL/min (A) (by C-G formula based on SCr of 1.04 mg/dL (H)). Liver Function Tests: No results for input(s): AST, ALT, ALKPHOS, BILITOT, PROT, ALBUMIN in the last 168 hours. No results for input(s): LIPASE, AMYLASE in the last 168 hours. No results for input(s): AMMONIA in the last 168 hours. Coagulation Profile: No results for input(s): INR, PROTIME in the last 168 hours. Cardiac Enzymes: No results for input(s): CKTOTAL, CKMB, CKMBINDEX, TROPONINI in the last 168 hours. BNP (last 3 results) No results for input(s): PROBNP in the last 8760 hours. HbA1C: No results for input(s): HGBA1C in the last 72 hours. CBG: No results for input(s): GLUCAP in the last 168 hours. Lipid Profile: No results for input(s): CHOL, HDL, LDLCALC, TRIG, CHOLHDL, LDLDIRECT in the last 72 hours. Thyroid Function Tests: No results for input(s): TSH, T4TOTAL, FREET4, T3FREE, THYROIDAB in the  last 72 hours. Anemia Panel: No results for input(s): VITAMINB12, FOLATE, FERRITIN, TIBC, IRON, RETICCTPCT in the last 72 hours. Urine analysis:    Component Value Date/Time   COLORURINE YELLOW (A) 12/30/2018 1335   APPEARANCEUR CLOUDY (  A) 12/30/2018 1335   LABSPEC 1.015 12/30/2018 1335   PHURINE 7.0 12/30/2018 1335   GLUCOSEU NEGATIVE 12/30/2018 1335   HGBUR NEGATIVE 12/30/2018 1335   BILIRUBINUR NEGATIVE 12/30/2018 1335   KETONESUR 5 (A) 12/30/2018 1335   PROTEINUR 30 (A) 12/30/2018 1335   UROBILINOGEN 0.2 03/02/2009 1415   NITRITE NEGATIVE 12/30/2018 1335   LEUKOCYTESUR LARGE (A) 12/30/2018 1335   Sepsis Labs: (procalcitonin:4,lacticidven:4) )No results found for this or any previous visit (from the past 240 hour(s)).   Radiological Exams on Admission: Dg Chest 2 View  Result Date: 12/30/2018 CLINICAL DATA:  Cough. EXAM: CHEST - 2 VIEW COMPARISON:  Chest x-ray and chest CT 03/22/2016. FINDINGS: Mediastinum and hilar structures normal. Postsurgical changes right lung. Mild bibasilar infiltrates. No pleural effusion or pneumothorax. Degenerative change thoracic spine. Lung volumes. IMPRESSION: 1.  Low lung volumes.  Mild bibasilar pulmonary infiltrates. 2.  Postsurgical changes right lung. Electronically Signed   By: Maisie Fus  Register   On: 12/30/2018 13:16   Dg Wrist Complete Right  Result Date: 12/30/2018 CLINICAL DATA:  Fall EXAM: RIGHT WRIST - COMPLETE 3+ VIEW COMPARISON:  08/19/2009 FINDINGS: There is no evidence of fracture or dislocation. There is no evidence of arthropathy or other focal bone abnormality. Soft tissues are unremarkable. IMPRESSION: Negative. Electronically Signed   By: Marlan Palau M.D.   On: 12/30/2018 13:12   Ct Head Wo Contrast  Result Date: 12/30/2018 CLINICAL DATA:  Generalized weakness, increased falls EXAM: CT HEAD WITHOUT CONTRAST TECHNIQUE: Contiguous axial images were obtained from the base of the skull through the vertex without  intravenous contrast. COMPARISON:  CT 12/29/2009 FINDINGS: Brain: No evidence of acute infarction, hemorrhage, hydrocephalus, extra-axial collection or mass lesion/mass effect. Symmetric prominence of the ventricles, cisterns and sulci compatible with somewhat age advanced frontal predominant parenchymal volume loss. Vascular: No hyperdense vessel or unexpected calcification. Skull: No calvarial fracture or suspicious osseous lesion. No scalp swelling or hematoma. Sinuses/Orbits: Paranasal sinuses and mastoid air cells are predominantly clear. Included orbital structures are unremarkable. Other: None IMPRESSION: 1. No acute intracranial abnormality. 2. Mild age advanced frontal predominant parenchymal volume loss. Electronically Signed   By: Kreg Shropshire M.D.   On: 12/30/2018 14:41     EKG: Independently reviewed. Sinus rhythm, QTc 467, LAE.  Assessment/Plan Principal Problem:   UTI (urinary tract infection) Active Problems:   CRPS (complex regional pain syndrome type I)   Asthma   Depression with anxiety   HLD (hyperlipidemia)   Fall   Hypothyroidism   CAP (community acquired pneumonia)   Seizure (HCC)   UTI (urinary tract infection): -place on med-surg bed for obs Rocephin -f/u Bx and Ux  HLD (hyperlipidemia): -zocor  Fall and generalized weakness: CT-head negative. X-ray of right wrist negative for bony Fx. -PT/OT  Hypothyroidism: -synthroid  Possible CAP (community acquired pneumonia): has cough andSOB, and CXR showed bilateral basilar infiltration. Pending covid 19 test. -on rocephin which is also for UTI. -Azithromycin- -Bx and sputum culture  Seizure -Seizure precaution -When necessary Ativan for seizure -Continue Home medications:  lamictal  Asthma: no wheezing -bronchodilators  Depression and anxiety: Stable, no suicidal or homicidal ideations. -Continue home medications  Hx of CRPS (complex regional pain syndrome type I): -continue home pain meds   DVT  ppx:  SQ Lovenox Code Status: Full code Family Communication:   Yes, patient's daughter in-law   at bed side Disposition Plan:  Anticipate discharge back to previous home environment Consults called:  none Admission status: med-surg bed for  obs  Date of Service 12/30/2018  :    Lorretta Harp [facility]   If 7PM-7AM, please contact night-coverage [email or web address] Password [facility] 12/30/2018, 11:22 PM

## 2018-12-30 NOTE — ED Notes (Signed)
This RN entered pt's room as call bell had been set off. However, upon entrance, pt asleep. Chest rise and fall visible.

## 2018-12-30 NOTE — ED Notes (Signed)
Pt initally refuses to have BP taken then allows post education. Pt freaks out while her BP is being taken stating "it is hurting me, it is hurting me" and starts crying. Pt shaking.

## 2018-12-30 NOTE — ED Provider Notes (Signed)
[facility] Emergency Department Provider Note  Time seen: 2:18 PM  I have reviewed the triage vital signs and the nursing notes.   HISTORY  Chief Complaint Weakness and Dizziness   HPI Katie Neal is a 48 y.o. female with a past medical history arthritis, asthma, complex regional pain syndrome, PTSD, presents to the emergency department with complaints of generalized weakness chills/rigors cough and falls.  According to the patient for the past several days she has been very weak has been nauseated not been eating or drinking much.  Patient states she has been experiencing chills/rigors as well as a very frequent cough.  States the cough has been ongoing times several weeks.  She got Covid tested 1 week ago which was negative per patient.  Denies any known fevers at home.    Past Medical History:  Diagnosis Date  . Arthritis   . Asthma   . CRPS (complex regional pain syndrome type I)   . Depression   . IBS (irritable bowel syndrome)   . Insomnia   . PTSD (post-traumatic stress disorder)     There are no active problems to display for this patient.   Past Surgical History:  Procedure Laterality Date  . ABDOMINAL HYSTERECTOMY    . [place]  . APPENDECTOMY    . FOOT SURGERY  2002   due to deformity  . HIP SURGERY  2012  . KNEE ARTHROSCOPY    . LAPAROSCOPIC HYSTERECTOMY  2000  . REPAIR TENDONS LEG     x2    Prior to Admission medications   Medication Sig Start Date End Date Taking? Authorizing Provider  albuterol (PROVENTIL HFA;VENTOLIN HFA) 108 (90 Base) MCG/ACT inhaler Inhale 2 puffs into the lungs every 6 (six) hours as needed for wheezing. 12/06/15   [provider]  ALPRAZolam (XANAX) 1 MG tablet TAKE TWO (2) TABLETS BY MOUTH TWICE DAILY. Patient not taking: Reported on 03/22/2016 12/14/11   Theda Sers, PA-C  amitriptyline (ELAVIL) 10 MG tablet Take 25 mg by mouth at bedtime.     [provider]   Ascorbic Acid (VITAMIN C WITH ROSE HIPS) 1000 MG tablet Take 1,000 mg by mouth 2 (two) times daily.    [provider]  baclofen (LIORESAL) 10 MG tablet Take 10 mg by mouth 3 (three) times daily. 02/27/14   [provider]  budesonide (PULMICORT) 0.25 MG/2ML nebulizer solution Take 0.25 mg by nebulization once.    [provider]  Cholecalciferol (VITAMIN D-1000 MAX ST) 1000 units tablet Take 5,000 Units by mouth daily.    [provider]  dicyclomine (BENTYL) 20 MG tablet Take 20 mg by mouth 3 (three) times daily as needed for pain. 08/30/14   [provider]  doxepin (SINEQUAN) 10 MG capsule TAKE ONE CAPSULE BY MOUTH AT BEDTIME Patient not taking: Reported on 02/24/2018 01/04/12   Rise Mu, PA-C  fluticasone [facility]) 50 MCG/ACT nasal spray Place 2 sprays into both nostrils daily.    [provider]  gabapentin (NEURONTIN) 600 MG tablet Take 1,200 mg by mouth 3 (three) times daily.     [provider]  Gabapentin Enacarbil (HORIZANT) 600 MG TBCR Take 600 tablets by mouth 2 (two) times daily.    [provider]  hydroxychloroquine (PLAQUENIL) 200 MG tablet Take 200 mg by mouth 2 (two) times daily.    [provider]  hydrOXYzine (ATARAX/VISTARIL) 25 MG tablet Take 25 mg by mouth 3 (three)  times daily as needed.    [provider]  lamoTRIgine (LAMICTAL) 200 MG tablet Take 200 mg by mouth daily. 03/14/16 06/12/16  [provider]  lamoTRIgine 100 MG TBDP Take 4 tablets by mouth once.    [provider]  levETIRAcetam (KEPPRA) 750 MG tablet Take 750 mg by mouth 2 (two) times daily. 03/01/16 03/01/17  [provider]  levorphanol (LEVODROMORAN) 2 MG tablet Take 2 mg by mouth every 6 (six) hours as needed for pain.    [provider]  levothyroxine (SYNTHROID, LEVOTHROID) 137 MCG tablet Take 137 mcg by mouth daily. 11/10/15   [provider]  metoprolol succinate (TOPROL-XL)  25 MG 24 hr tablet Take 25 mg by mouth daily. 07/05/15 02/24/18  [provider]  montelukast (SINGULAIR) 10 MG tablet Take 10 mg by mouth daily. 12/06/15   [provider]  morphine (MS CONTIN) 30 MG 12 hr tablet Take 30 mg by mouth every 12 (twelve) hours. 02/13/16   [provider]  morphine (MSIR) 15 MG tablet Take 15 mg by mouth every 12 (twelve) hours. 02/13/16   [provider]  Olopatadine HCl (PAZEO) 0.7 % SOLN Place 1 drop into both eyes daily as needed.    [provider]  ondansetron (ZOFRAN-ODT) 4 MG disintegrating tablet Take 4 mg by mouth every 8 (eight) hours as needed for nausea. 01/13/15   [provider]  pantoprazole (PROTONIX) 40 MG tablet Take 40 mg by mouth daily.    [provider]  simvastatin (ZOCOR) 20 MG tablet Take 20 mg by mouth daily. 03/12/14   [provider]  SUMAtriptan (IMITREX) 100 MG tablet Take 1 tablet (100 mg total) by mouth once as needed for migraine. Patient not taking: Reported on 03/22/2016 06/14/11   Lamar LaundryMayans, Laura C, MD  Tiotropium Bromide Monohydrate 2.5 MCG/ACT AERS Inhale 5 mcg into the lungs daily. 11/18/15   [provider]  traZODone (DESYREL) 100 MG tablet Take 2 tablets (200 mg total) by mouth at bedtime. Needs office visit 12/14/11   Nelva NayMarte, Heather M, PA-C  venlafaxine XR (EFFEXOR-XR) 75 MG 24 hr capsule Take 3 capsules (225 mg total) by mouth daily. 06/14/11   Mayans, Assunta GamblesLaura C, MD  vitamin B-12 (CYANOCOBALAMIN) 1000 MCG tablet Take 1,000 mcg by mouth daily.    [provider]  vitamin E 400 UNIT capsule Take 400 Units by mouth daily.    [provider]  zolpidem (AMBIEN CR) 12.5 MG CR tablet Take 1 tablet (12.5 mg total) by mouth at bedtime as needed for sleep. Patient not taking: Reported on 03/22/2016 10/16/15 10/15/16  Emily FilbertWilliams, Jonathan E, MD    Allergies  Allergen Reactions  . Codeine Itching    No family history on file.  Social History Social  History   Tobacco Use  . Smoking status: Former Smoker    Types: Cigarettes  . Smokeless tobacco: Never Used  Substance Use Topics  . Alcohol use: No    Comment: very rarely  . Drug use: No    Review of Systems Constitutional: Negative for fever, positive for chills/rigors. Cardiovascular: Negative for chest pain. Respiratory: Negative for shortness of breath.  Positive for cough times weeks. Gastrointestinal: Negative for abdominal pain.  Positive for nausea. Genitourinary: Negative for urinary compaints Musculoskeletal: Right wrist pain Neurological: Moderate headache All other ROS negative  ____________________________________________   PHYSICAL EXAM:  VITAL SIGNS: ED Triage Vitals [12/30/18 1235]  Enc Vitals Group     BP (!) 137/104  Pulse Rate 95     Resp (!) 22     Temp 98.6 F (37 C)     Temp Source Oral     SpO2 97 %     Weight 200 lb (90.7 kg)     Height 5\' 6"  (1.676 m)     Head Circumference      Peak Flow      Pain Score 8     Pain Loc      Pain Edu?      Excl. in GC?    Constitutional: Patient is awake alert, oriented, patient is in mild distress moaning stating she does not feel well. Eyes: Normal exam ENT      Head: Normocephalic and atraumatic.      Mouth/Throat: Mucous membranes are moist. Cardiovascular: Normal rate, regular rhythm.  Respiratory: Mild tachypnea but clear lung sounds bilaterally without any wheeze rales or rhonchi.  Frequent cough during my evaluation. Gastrointestinal: Soft and nontender. No distention.  Musculoskeletal: Patient has tenderness of the right wrist from a fall yesterday. Neurologic:  Normal speech and language. No gross focal neurologic deficits  Skin:  Skin is warm, dry and intact.  Psychiatric: Mood and affect are normal.  ____________________________________________    EKG  EKG viewed and interpreted by myself shows a normal sinus rhythm at 94 bpm with a narrow QRS, normal axis, normal intervals,  nonspecific ST changes.  ____________________________________________    RADIOLOGY  X-ray shows mild bibasilar infiltrates. Wrist x-ray is negative  ____________________________________________   INITIAL IMPRESSION / ASSESSMENT AND PLAN / ED COURSE  Pertinent labs & imaging results that were available during my care of the patient were reviewed by me and considered in my medical decision making (see chart for details).   Patient presents to the emergency department for generalized fatigue and weakness.  Patient states she has been experiencing chills and rigors, has been having a frequent cough and had a fall yesterday due to the weakness.  States she hit her head and hurt her right wrist.  X-rays negative for fracture.  Chest x-ray appears to show bibasilar infiltrates.  Given her frequent cough we will cover with antibiotics.  Urinalysis consistent with significant urinary tract infection, we will send a urine culture and cover with Rocephin.  Given the patient's frequent cough with bibasilar infiltrates we will obtain a Covid test as well.  Patient is afebrile reassuringly has a normal white blood cell count.  Patient states she is too weak to walk and had a fall yesterday.  Given the patient's bilateral pneumonia urinary tract infection and complains of significant weakness unable to safely ambulate at home we will discuss with the hospitalist for admission.  Katie Neal was evaluated in Emergency Department on 12/30/2018 for the symptoms described in the history of present illness. She was evaluated in the context of the global COVID-19 pandemic, which necessitated consideration that the patient might be at risk for infection with the SARS-CoV-2 virus that causes COVID-19. Institutional protocols and algorithms that pertain to the evaluation of patients at risk for COVID-19 are in a state of rapid change based on information released by regulatory bodies including the CDC and federal and  state organizations. These policies and algorithms were followed during the patient's care in the ED.  ____________________________________________   FINAL CLINICAL IMPRESSION(S) / ED DIAGNOSES  Pneumonia Urinary tract infection Weakness   14/08/2018, MD 12/30/18 1457

## 2018-12-31 DIAGNOSIS — B9620 Unspecified Escherichia coli [E. coli] as the cause of diseases classified elsewhere: Secondary | ICD-10-CM | POA: Diagnosis present

## 2018-12-31 DIAGNOSIS — R569 Unspecified convulsions: Secondary | ICD-10-CM | POA: Diagnosis present

## 2018-12-31 DIAGNOSIS — G9050 Complex regional pain syndrome I, unspecified: Secondary | ICD-10-CM | POA: Diagnosis present

## 2018-12-31 DIAGNOSIS — R531 Weakness: Secondary | ICD-10-CM

## 2018-12-31 DIAGNOSIS — K589 Irritable bowel syndrome without diarrhea: Secondary | ICD-10-CM | POA: Diagnosis present

## 2018-12-31 DIAGNOSIS — Z90710 Acquired absence of both cervix and uterus: Secondary | ICD-10-CM | POA: Diagnosis not present

## 2018-12-31 DIAGNOSIS — Z79891 Long term (current) use of opiate analgesic: Secondary | ICD-10-CM | POA: Diagnosis not present

## 2018-12-31 DIAGNOSIS — Z20828 Contact with and (suspected) exposure to other viral communicable diseases: Secondary | ICD-10-CM | POA: Diagnosis present

## 2018-12-31 DIAGNOSIS — J4520 Mild intermittent asthma, uncomplicated: Secondary | ICD-10-CM | POA: Diagnosis not present

## 2018-12-31 DIAGNOSIS — J189 Pneumonia, unspecified organism: Secondary | ICD-10-CM | POA: Diagnosis present

## 2018-12-31 DIAGNOSIS — R296 Repeated falls: Secondary | ICD-10-CM | POA: Diagnosis present

## 2018-12-31 DIAGNOSIS — F418 Other specified anxiety disorders: Secondary | ICD-10-CM

## 2018-12-31 DIAGNOSIS — E039 Hypothyroidism, unspecified: Secondary | ICD-10-CM

## 2018-12-31 DIAGNOSIS — F4310 Post-traumatic stress disorder, unspecified: Secondary | ICD-10-CM | POA: Diagnosis present

## 2018-12-31 DIAGNOSIS — W19XXXA Unspecified fall, initial encounter: Secondary | ICD-10-CM

## 2018-12-31 DIAGNOSIS — N390 Urinary tract infection, site not specified: Secondary | ICD-10-CM | POA: Diagnosis present

## 2018-12-31 DIAGNOSIS — N3000 Acute cystitis without hematuria: Secondary | ICD-10-CM

## 2018-12-31 DIAGNOSIS — E785 Hyperlipidemia, unspecified: Secondary | ICD-10-CM

## 2018-12-31 DIAGNOSIS — Z79890 Hormone replacement therapy (postmenopausal): Secondary | ICD-10-CM | POA: Diagnosis not present

## 2018-12-31 DIAGNOSIS — G4700 Insomnia, unspecified: Secondary | ICD-10-CM | POA: Diagnosis present

## 2018-12-31 DIAGNOSIS — Z87891 Personal history of nicotine dependence: Secondary | ICD-10-CM | POA: Diagnosis not present

## 2018-12-31 DIAGNOSIS — Z7951 Long term (current) use of inhaled steroids: Secondary | ICD-10-CM | POA: Diagnosis not present

## 2018-12-31 DIAGNOSIS — Z79899 Other long term (current) drug therapy: Secondary | ICD-10-CM | POA: Diagnosis not present

## 2018-12-31 DIAGNOSIS — Z885 Allergy status to narcotic agent status: Secondary | ICD-10-CM | POA: Diagnosis not present

## 2018-12-31 DIAGNOSIS — W1830XA Fall on same level, unspecified, initial encounter: Secondary | ICD-10-CM | POA: Diagnosis present

## 2018-12-31 LAB — RESPIRATORY PANEL BY PCR

## 2018-12-31 LAB — BASIC METABOLIC PANEL
Anion gap: 11 (ref 5–15)
BUN: 13 mg/dL (ref 6–20)
CO2: 22 mmol/L (ref 22–32)
Calcium: 9 mg/dL (ref 8.9–10.3)
Chloride: 106 mmol/L (ref 98–111)
Creatinine, Ser: 0.8 mg/dL (ref 0.44–1.00)
GFR calc Af Amer: >60 mL/min (ref >60)
GFR calc non Af Amer: >60 mL/min (ref >60)
Glucose, Bld: 109 mg/dL — ABNORMAL HIGH (ref 70–99)
Potassium: 3.5 mmol/L (ref 3.5–5.1)
Sodium: 139 mmol/L (ref 135–145)

## 2018-12-31 LAB — CBC
HCT: 35.9 % — ABNORMAL LOW (ref 36.0–46.0)
Hemoglobin: 12.8 g/dL (ref 12.0–15.0)
MCH: 29.6 pg (ref 26.0–34.0)
MCHC: 35.7 g/dL (ref 30.0–36.0)
MCV: 83.1 fL (ref 80.0–100.0)
Platelets: 193 10*3/uL (ref 150–400)
RBC: 4.32 MIL/uL (ref 3.87–5.11)
RDW: 12.3 % (ref 11.5–15.5)
WBC: 11.7 10*3/uL — ABNORMAL HIGH (ref 4.0–10.5)
nRBC: 0 % (ref 0.0–0.2)

## 2018-12-31 LAB — PREGNANCY, URINE: Preg Test, Ur: NEGATIVE

## 2018-12-31 LAB — HIV ANTIBODY (ROUTINE TESTING W REFLEX): HIV Screen 4th Generation wRfx: NONREACTIVE

## 2018-12-31 MED ORDER — LEVORPHANOL TARTRATE 2 MG PO TABS
2.0000 mg | ORAL_TABLET | Freq: Three times a day (TID) | ORAL | Status: DC
  Administered 2018-12-31 – 2019-01-01 (×3): 2 mg via ORAL
  Filled 2018-12-31 (×4): qty 1

## 2018-12-31 MED ORDER — HYDROXYZINE HCL 25 MG PO TABS
25.0000 mg | ORAL_TABLET | Freq: Three times a day (TID) | ORAL | Status: DC | PRN
  Filled 2018-12-31: qty 1

## 2018-12-31 MED ORDER — MONTELUKAST SODIUM 10 MG PO TABS
10.0000 mg | ORAL_TABLET | Freq: Every day | ORAL | Status: DC
  Administered 2018-12-31 – 2019-01-01 (×2): 10 mg via ORAL
  Filled 2018-12-31 (×2): qty 1

## 2018-12-31 MED ORDER — ADULT MULTIVITAMIN W/MINERALS CH
1.0000 | ORAL_TABLET | Freq: Every day | ORAL | Status: DC
  Administered 2018-12-31 – 2019-01-01 (×2): 1 via ORAL
  Filled 2018-12-31 (×2): qty 1

## 2018-12-31 MED ORDER — VENLAFAXINE HCL ER 75 MG PO CP24
225.0000 mg | ORAL_CAPSULE | Freq: Every day | ORAL | Status: DC
  Administered 2018-12-31 – 2019-01-01 (×2): 225 mg via ORAL
  Filled 2018-12-31 (×2): qty 1

## 2018-12-31 MED ORDER — TRAZODONE HCL 50 MG PO TABS
200.0000 mg | ORAL_TABLET | Freq: Every day | ORAL | Status: DC
  Administered 2018-12-31 (×2): 200 mg via ORAL
  Filled 2018-12-31 (×3): qty 4

## 2018-12-31 MED ORDER — LAMOTRIGINE 100 MG PO TABS
200.0000 mg | ORAL_TABLET | Freq: Every day | ORAL | Status: DC
  Administered 2018-12-31: 200 mg via ORAL
  Filled 2018-12-31 (×2): qty 8
  Filled 2018-12-31 (×2): qty 2

## 2018-12-31 MED ORDER — GABAPENTIN ENACARBIL ER 600 MG PO TBCR: 600.0000 mg | EXTENDED_RELEASE_TABLET | Freq: Two times a day (BID) | ORAL | Status: DC

## 2018-12-31 MED ORDER — PANTOPRAZOLE SODIUM 40 MG PO TBEC
40.0000 mg | DELAYED_RELEASE_TABLET | Freq: Every day | ORAL | Status: DC
  Administered 2018-12-31 – 2019-01-01 (×2): 40 mg via ORAL
  Filled 2018-12-31 (×2): qty 1

## 2018-12-31 MED ORDER — OLOPATADINE HCL 0.1 % OP SOLN
1.0000 [drp] | Freq: Two times a day (BID) | OPHTHALMIC | Status: DC | PRN
  Filled 2018-12-31: qty 5

## 2018-12-31 MED ORDER — TIZANIDINE HCL 4 MG PO TABS
4.0000 mg | ORAL_TABLET | Freq: Three times a day (TID) | ORAL | Status: DC | PRN
  Administered 2018-12-31: 4 mg via ORAL
  Filled 2018-12-31 (×3): qty 1

## 2018-12-31 MED ORDER — MOMETASONE FURO-FORMOTEROL FUM 100-5 MCG/ACT IN AERO
2.0000 | INHALATION_SPRAY | Freq: Two times a day (BID) | RESPIRATORY_TRACT | Status: DC
  Administered 2018-12-31 – 2019-01-01 (×3): 2 via RESPIRATORY_TRACT
  Filled 2018-12-31: qty 8.8

## 2018-12-31 MED ORDER — METOPROLOL SUCCINATE ER 25 MG PO TB24
25.0000 mg | ORAL_TABLET | Freq: Every day | ORAL | Status: DC
  Administered 2018-12-31 – 2019-01-01 (×2): 25 mg via ORAL
  Filled 2018-12-31 (×2): qty 1

## 2018-12-31 MED ORDER — LEVOTHYROXINE SODIUM 137 MCG PO TABS
137.0000 ug | ORAL_TABLET | Freq: Every day | ORAL | Status: DC
  Administered 2018-12-31 – 2019-01-01 (×2): 137 ug via ORAL
  Filled 2018-12-31 (×2): qty 1

## 2018-12-31 MED ORDER — SIMVASTATIN 20 MG PO TABS
10.0000 mg | ORAL_TABLET | Freq: Every day | ORAL | Status: DC
  Administered 2018-12-31: 10 mg via ORAL
  Filled 2018-12-31: qty 1

## 2018-12-31 MED ORDER — ENOXAPARIN SODIUM 40 MG/0.4ML ~~LOC~~ SOLN
40.0000 mg | SUBCUTANEOUS | Status: DC
  Administered 2018-12-31 – 2019-01-01 (×2): 40 mg via SUBCUTANEOUS
  Filled 2018-12-31 (×2): qty 0.4

## 2018-12-31 MED ORDER — DICYCLOMINE HCL 20 MG PO TABS
20.0000 mg | ORAL_TABLET | Freq: Three times a day (TID) | ORAL | Status: DC
  Administered 2018-12-31 – 2019-01-01 (×4): 20 mg via ORAL
  Filled 2018-12-31 (×6): qty 1

## 2018-12-31 MED ORDER — FLUTICASONE PROPIONATE 50 MCG/ACT NA SUSP
2.0000 | Freq: Every day | NASAL | Status: DC
  Administered 2018-12-31 – 2019-01-01 (×2): 2 via NASAL
  Filled 2018-12-31: qty 16

## 2018-12-31 MED ORDER — AMITRIPTYLINE HCL 25 MG PO TABS
25.0000 mg | ORAL_TABLET | Freq: Every day | ORAL | Status: DC
  Administered 2018-12-31 (×2): 25 mg via ORAL
  Filled 2018-12-31 (×3): qty 1

## 2018-12-31 MED ORDER — LAMOTRIGINE 100 MG PO TABS
100.0000 mg | ORAL_TABLET | Freq: Every morning | ORAL | Status: DC
  Administered 2018-12-31 – 2019-01-01 (×2): 100 mg via ORAL
  Filled 2018-12-31 (×2): qty 1
  Filled 2018-12-31: qty 4

## 2018-12-31 NOTE — Progress Notes (Deleted)
Pt to CT

## 2018-12-31 NOTE — Progress Notes (Signed)
Pt very anxious today. She reported "having 5 seizures already today" when RN entered room. Pt did not report this to staff as they were happening. When asked how she knows she had a seizure she states, "I get pain signals in my brain." Pt has padded side rails, oxygen and suction set up. Dr. Reesa Chew notified, received order for video monitoring.  All telemonitors currently being used. Dr. Reesa Chew notified.

## 2018-12-31 NOTE — Progress Notes (Signed)
PROGRESS NOTE    Katie Neal  TIW:[id number] DOB: 1970-04-23 DOA: 12/30/2018 PCP: Rayetta Humphrey, MD   Brief Narrative:  Katie Neal is a 48 y.o. female with medical history significant of CRPS (complex regional pain syndrome type I), hyperlipidemia, asthma, hypothyroidism, depression, anxiety, PTSD, IBS, insomnia, seizure, vertigo, who presents with generalized weakness, multiple falls, dysuria. No significant injury. UA positive for possible UTI-urine culture pending.  Getting ceftriaxone. Covid negative.  Having cough and congestion-RVP pending.  Subjective: Patient continued to feel bad.  Having generalized body aches.  Feeling more congested with worsening cough.  She was also complaining of multiple seizures while in hospital.  Patient states that she cannot feel seizure by change in her pain pattern.  No witnessed seizure.  Assessment & Plan:   Principal Problem:   UTI (urinary tract infection) Active Problems:   CRPS (complex regional pain syndrome type I)   Asthma   Depression with anxiety   HLD (hyperlipidemia)   Fall   Hypothyroidism   CAP (community acquired pneumonia)   Seizure (HCC)  UTI (urinary tract infection): Remained afebrile with some leukocytosis. Pan positive symptoms.  Could not specify any urinary symptoms. -Urine culture growing more than 100,000 colonies of gram negative rods. -Continue ceftriaxone-we will narrow down her antibiotics once sensitivity results become available.  Upper respiratory symptoms/ CAP.  Most likely viral URI.  COVID-19 negative. -Checking for respiratory viral panel. -Symptomatic management. -Patient was placed on azithromycin yesterday due to questionable bilateral lower lobe infiltrates. -Blood cultures negative.  Seizures.  Patient appears very anxious and complaining of multiple seizures while in room.  No witnessed seizure-like activity. -We will do telemetry sitter to monitor. -Continue home dose of  Lamictal. -Continue seizure precautions.  Hypothyroidism.  No TSH in the system. On care everywhere it was 0.26 and July 2020.  She also has positive antithyroglobulin and antimicrosomal antibodies. -Recheck TSH. -Continue home dose of Synthroid.  Fall and generalized weakness.  Imaging negative for any fractures. -PT/OT evaluation.  Hyperlipidemia. -Continue Zocor.  History of asthma. -No wheezing or shortness of breath today. -Continue home meds.  History of complex regional pain syndrome type I. -Continue home pain meds.  Depression and anxiety.  Patient appears very anxious.  Denies any suicidal thoughts. -Continue home meds.  Objective: Vitals:   12/31/18 0104 12/31/18 0203 12/31/18 0852 12/31/18 0927  BP: 117/65 (!) 153/95 (!) 143/85 (!) 132/91  Pulse: 82 78 90 72  Resp: 17 18  17   Temp: 98.6 F (37 C) 98.3 F (36.8 C) 97.7 F (36.5 C) 98.1 F (36.7 C)  TempSrc:   Oral Oral  SpO2: 94% 100% 99% 98%  Weight:  86.5 kg    Height:  5\' 6"  (1.676 m)      Intake/Output Summary (Last 24 hours) at 12/31/2018 1337 Last data filed at 12/31/2018 1015 Gross per 24 hour  Intake 120 ml  Output -  Net 120 ml   Filed Weights   12/30/18 1235 12/31/18 0203  Weight: 90.7 kg 86.5 kg    Examination:  General exam: Morbidly obese anxious lady, in no acute distress. Respiratory system: Clear to auscultation. Respiratory effort normal. Cardiovascular system: S1 & S2 heard, RRR. No JVD, murmurs, rubs, gallops or clicks. No pedal edema. Gastrointestinal system: Abdomen is nondistended, soft and nontender. No organomegaly or masses felt. Normal bowel sounds heard. Central nervous system: Alert and oriented. No focal neurological deficits. Extremities: Symmetric 5 x 5 power. Skin: No rashes, lesions or ulcers  Psychiatry: Judgement and insight appear normal.    DVT prophylaxis: Lovenox Code Status: Full Family Communication: No family at bedside Disposition Plan: Most likely  be home tomorrow.  Consultants:   None  Procedures:   Antimicrobials:  Ceftriaxone-day 2 Azithromycin-day 2  Data Reviewed: I have personally reviewed following labs and imaging studies  CBC: Recent Labs  Lab 12/30/18 1239 12/31/18 0600  WBC 10.1 11.7*  HGB 13.7 12.8  HCT 38.4 35.9*  MCV 83.1 83.1  PLT 206 518   Basic Metabolic Panel: Recent Labs  Lab 12/30/18 1239 12/31/18 0600  NA 136 139  K 3.7 3.5  CL 99 106  CO2 20* 22  GLUCOSE 126* 109*  BUN 14 13  CREATININE 1.04* 0.80  CALCIUM 9.2 9.0   GFR: Estimated Creatinine Clearance: 96.3 mL/min (by C-G formula based on SCr of 0.8 mg/dL). Liver Function Tests: No results for input(s): AST, ALT, ALKPHOS, BILITOT, PROT, ALBUMIN in the last 168 hours. No results for input(s): LIPASE, AMYLASE in the last 168 hours. No results for input(s): AMMONIA in the last 168 hours. Coagulation Profile: No results for input(s): INR, PROTIME in the last 168 hours. Cardiac Enzymes: No results for input(s): CKTOTAL, CKMB, CKMBINDEX, TROPONINI in the last 168 hours. BNP (last 3 results) No results for input(s): PROBNP in the last 8760 hours. HbA1C: No results for input(s): HGBA1C in the last 72 hours. CBG: No results for input(s): GLUCAP in the last 168 hours. Lipid Profile: No results for input(s): CHOL, HDL, LDLCALC, TRIG, CHOLHDL, LDLDIRECT in the last 72 hours. Thyroid Function Tests: No results for input(s): TSH, T4TOTAL, FREET4, T3FREE, THYROIDAB in the last 72 hours. Anemia Panel: No results for input(s): VITAMINB12, FOLATE, FERRITIN, TIBC, IRON, RETICCTPCT in the last 72 hours. Sepsis Labs: No results for input(s): PROCALCITON, LATICACIDVEN in the last 168 hours.  Recent Results (from the past 240 hour(s))  Urine culture     Status: Abnormal (Preliminary result)   Collection Time: 12/30/18  2:13 PM   Specimen: Urine, Random  Result Value Ref Range Status   Specimen Description   Final    URINE, RANDOM Performed  at [facility], [address], [place], [place] [address]    Special Requests   Final    NONE Performed at [facility], [address] [place], [place] [address]    Culture >=100,000 COLONIES/mL GRAM NEGATIVE RODS (A)  Final   Report Status PENDING  Incomplete  SARS CORONAVIRUS 2 (TAT 6-24 HRS) Nasopharyngeal Nasopharyngeal Swab     Status: None   Collection Time: 12/30/18  2:55 PM   Specimen: Nasopharyngeal Swab  Result Value Ref Range Status   SARS Coronavirus 2 NEGATIVE NEGATIVE Final    Comment: (NOTE) SARS-CoV-2 target nucleic acids are NOT DETECTED. The SARS-CoV-2 RNA is generally detectable in upper and lower respiratory specimens during the acute phase of infection. Negative results do not preclude SARS-CoV-2 infection, do not rule out co-infections with other pathogens, and should not be used as the sole basis for treatment or other patient management decisions. Negative results must be combined with clinical observations, patient history, and epidemiological information. The expected result is Negative. Fact Sheet for Patients: SugarRoll.be Fact Sheet for Healthcare Providers: https://www.woods-mathews.com/ This test is not yet approved or cleared by the [place] FDA and  has been authorized for detection and/or diagnosis of SARS-CoV-2 by FDA under an Emergency Use Authorization (EUA). This EUA will remain  in effect (meaning this test can be used) for  the duration of the COVID-19 declaration under Section 56 4(b)(1) of the Act, 21 U.S.C. section 360bbb-3(b)(1), unless the authorization is terminated or revoked sooner. Performed at [facility], [address] [place], [place] [address]   Blood Culture (routine x 2)     Status: None (Preliminary result)   Collection Time: 12/30/18  3:15 PM   Specimen: BLOOD  Result Value Ref Range Status   Specimen Description BLOOD RIGHT ANTECUBITAL  Final    Special Requests   Final    BOTTLES DRAWN AEROBIC AND ANAEROBIC Blood Culture adequate volume   Culture   Final    NO GROWTH < 24 HOURS Performed at [facility], [address] [place], [place] [address]    Report Status PENDING  Incomplete  Blood Culture (routine x 2)     Status: None (Preliminary result)   Collection Time: 12/30/18  3:15 PM   Specimen: BLOOD  Result Value Ref Range Status   Specimen Description BLOOD LEFT ANTECUBITAL  Final   Special Requests   Final    BOTTLES DRAWN AEROBIC AND ANAEROBIC Blood Culture adequate volume   Culture   Final    NO GROWTH < 24 HOURS Performed at [facility], [address] [place], [place] [address]    Report Status PENDING  Incomplete     Radiology Studies: Dg Chest 2 View  Result Date: 12/30/2018 CLINICAL DATA:  Cough. EXAM: CHEST - 2 VIEW COMPARISON:  Chest x-ray and chest CT 03/22/2016. FINDINGS: Mediastinum and hilar structures normal. Postsurgical changes right lung. Mild bibasilar infiltrates. No pleural effusion or pneumothorax. Degenerative change thoracic spine. Lung volumes. IMPRESSION: 1.  Low lung volumes.  Mild bibasilar pulmonary infiltrates. 2.  Postsurgical changes right lung. Electronically Signed   By: Maisie Fushomas  Register   On: 12/30/2018 13:16   Dg Wrist Complete Right  Result Date: 12/30/2018 CLINICAL DATA:  Fall EXAM: RIGHT WRIST - COMPLETE 3+ VIEW COMPARISON:  08/19/2009 FINDINGS: There is no evidence of fracture or dislocation. There is no evidence of arthropathy or other focal bone abnormality. Soft tissues are unremarkable. IMPRESSION: Negative. Electronically Signed   By: Marlan Palauharles  Clark M.D.   On: 12/30/2018 13:12   Ct Head Wo Contrast  Result Date: 12/30/2018 CLINICAL DATA:  Generalized weakness, increased falls EXAM: CT HEAD WITHOUT CONTRAST TECHNIQUE: Contiguous axial images were obtained from the base of the skull through the vertex without intravenous contrast. COMPARISON:  CT  12/29/2009 FINDINGS: Brain: No evidence of acute infarction, hemorrhage, hydrocephalus, extra-axial collection or mass lesion/mass effect. Symmetric prominence of the ventricles, cisterns and sulci compatible with somewhat age advanced frontal predominant parenchymal volume loss. Vascular: No hyperdense vessel or unexpected calcification. Skull: No calvarial fracture or suspicious osseous lesion. No scalp swelling or hematoma. Sinuses/Orbits: Paranasal sinuses and mastoid air cells are predominantly clear. Included orbital structures are unremarkable. Other: None IMPRESSION: 1. No acute intracranial abnormality. 2. Mild age advanced frontal predominant parenchymal volume loss. Electronically Signed   By: Kreg ShropshirePrice  DeHay M.D.   On: 12/30/2018 14:41    Scheduled Meds: . amitriptyline  25 mg Oral QHS  . guaiFENesin  600 mg Oral BID   And  . dextromethorphan  30 mg Oral BID  . dicyclomine  20 mg Oral TID AC  . enoxaparin (LOVENOX) injection  40 mg Subcutaneous Q24H  . fluticasone  2 spray Each Nare Daily  . Gabapentin Enacarbil  600 mg Oral BID  . ipratropium  2 puff Inhalation Q4H  . lamoTRIgine  100 mg Oral q morning - 10a  . lamoTRIgine  200 mg Oral QHS  . levorphanol  2 mg Oral Q8H  . levothyroxine  137 mcg Oral Daily  . metoprolol succinate  25 mg Oral Daily  . mometasone-formoterol  2 puff Inhalation BID  . montelukast  10 mg Oral Daily  . multivitamin with minerals  1 tablet Oral Daily  . pantoprazole  40 mg Oral Daily  . simvastatin  10 mg Oral q1800  . traZODone  200 mg Oral QHS  . venlafaxine XR  225 mg Oral Daily   Continuous Infusions: . azithromycin Stopped (12/30/18 1719)  . cefTRIAXone (ROCEPHIN)  IV Stopped (12/30/18 1543)     LOS: 0 days   Time spent: 45 minutes.  I personally reviewed her chart.  Arnetha Courser, MD [facility] Pager [phone]  If 7PM-7AM, please contact night-coverage [email or web address] Password [facility] 12/31/2018, 1:37 PM   This record has been  created using Conservation officer, historic buildings. Errors have been sought and corrected,but may not always be located. Such creation errors do not reflect on the standard of care.

## 2018-12-31 NOTE — Evaluation (Signed)
Occupational Therapy Evaluation Patient Details Name: Katie Neal MRN: [id number] DOB: Oct 04, 1970 Today's Date: 12/31/2018    History of Present Illness 48 yo female admitted after progressive generalized weakness, several falls lately with no significant injury however right wrist pain, reports dizziness and has hx of vertigo, cough and SOB. PMH includes CRPS, HLD, asthma, hypothyroidism, depression, anxiety, PTSD, vertigo, seizures   Clinical Impression   Pt seen for OT evaluation this date. Prior to hospital admission, pt was requiring intermittent assist for LB dressing from her daughter in law as well as supervision/setup for bathing to ensure safety. Pt endorses several previous falls.  Pt lives with her son and DIL in apartment with level entry (no steps) but report large threshhold.  Currently pt demonstrates impairments in standing balance and tolerance as well as global weakness requiring MIN A for ADL mobility and MOD/MAX A for LB ADLs.  Pt would benefit from skilled OT to address noted impairments and functional limitations (see below for any additional details) in order to maximize safety and independence while minimizing falls risk and caregiver burden.  Upon hospital discharge, recommend pt discharge to home with [facility] and 24/7 supervision for safety and fall prevention.    Follow Up Recommendations  Home health OT;Supervision/Assistance - 24 hour    Equipment Recommendations  3 in 1 bedside commode    Recommendations for Other Services       Precautions / Restrictions Precautions Precautions: Fall Restrictions Weight Bearing Restrictions: No Other Position/Activity Restrictions: Pt reports that her BP will at times "skyrocket" with mobility. OT takes BP after commdoe transfer. Pt with BP 120/92, pulse 105,  spO2 at 95% on 3Lnc      Mobility Bed Mobility Overal bed mobility: Needs Assistance Bed Mobility: Supine to Sit;Sit to Supine     Supine to sit: Min  guard;HOB elevated Sit to supine: Min guard;HOB elevated      Transfers Overall transfer level: Needs assistance Equipment used: Rolling walker (2 wheeled) Transfers: Sit to/from [facility] Sit to Stand: Min assist;From elevated surface Stand pivot transfers: Min assist;From elevated surface            Balance Overall balance assessment: Needs assistance Sitting-balance support: Bilateral upper extremity supported Sitting balance-Leahy Scale: Good     Standing balance support: Bilateral upper extremity supported Standing balance-Leahy Scale: Fair Standing balance comment: Pt requires MIN A to CGA with static standing balance. Anticiapte P dyanmic standing as pt with heavy use of B UEs through FWW and requiring assist throughout                           ADL either performed or assessed with clinical judgement   ADL Overall ADL's : Needs assistance/impaired Eating/Feeding: Set up;Sitting   Grooming: Wash/dry hands;Wash/dry face;Oral care;Set up;Sitting Grooming Details (indicate cue type and reason): Initially attempted to perform hand washing in standing with RW sink-side, but pt unable to tolerate standing long enough to complete task d/t pain Upper Body Bathing: Minimal assistance;Sitting Upper Body Bathing Details (indicate cue type and reason): based on clinical observation Lower Body Bathing: Moderate assistance;Sit to/from stand Lower Body Bathing Details (indicate cue type and reason): based on clinical observation Upper Body Dressing : Minimal assistance;Sitting   Lower Body Dressing: Moderate assistance;Maximal assistance;Sit to/from stand Lower Body Dressing Details (indicate cue type and reason): Pt requires assist to thread underwear in sitting. Then, While standing for clothing mgt over hips, Pt able to alternate  hands briefly to contribute to donning underwear, but requires ~60% assist to compelte clothing mgt over hips. Toilet  Transfer: Minimal Science writer Details (indicate cue type and reason): with use of toilet rails, pt shuffles to stand pivot to commode adjacent to bed. Toileting- Clothing Manipulation and Hygiene: Moderate assistance;Sit to/from stand Toileting - Clothing Manipulation Details (indicate cue type and reason): from [facility], pt performs peri care in sitting with setup, however, performs clothing mgt in standing and requires  MOD A     Functional mobility during ADLs: (deferred at this time, pt cites fatigue)       Vision Baseline Vision/History: Wears glasses Wears Glasses: At all times Patient Visual Report: No change from baseline       Perception     Praxis      Pertinent Vitals/Pain Pain Assessment: Faces Faces Pain Scale: Hurts worst Pain Location: legs, arms, head with any movement Pain Descriptors / Indicators: Aching;Sore;Constant;Grimacing;Moaning Pain Intervention(s): Limited activity within patient's tolerance;Monitored during session;Repositioned     Hand Dominance     Extremity/Trunk Assessment Upper Extremity Assessment Upper Extremity Assessment: Generalized weakness;RUE deficits/detail;LUE deficits/detail RUE Deficits / Details: shoudler 3+/5, elbow 3+/5, grip 4-/5 LUE Deficits / Details: shoudler 3+/5, elbow 3+/5, grip 4-/5   Lower Extremity Assessment Lower Extremity Assessment: Defer to PT evaluation;Generalized weakness       Communication Communication Communication: No difficulties   Cognition Arousal/Alertness: Awake/alert Behavior During Therapy: Anxious Overall Cognitive Status: Within Functional Limits for tasks assessed                                 General Comments: Pt with some increased time for processing required. Some anxiety related to anticipating pain with movement.   General Comments       Exercises Other Exercises Other Exercises: OT facilitates education with pt and pt's dtr in law  who is present at bedside re: role of OT in acute setting. Pt verbalized understanding. Other Exercises: OT facilitates education re: safe use of RW including safe hand placement with sit<>stand. Pt verbalized understanding, but states she has a poor memory and needs reminding.   Shoulder Instructions      Home Living Family/patient expects to be discharged to:: Private residence Living Arrangements: Children(son and daughter-in-law) Available Help at Discharge: Available PRN/intermittently;Family Type of Home: Apartment Home Access: Level entry     Home Layout: One level     Bathroom Shower/Tub: Chief Strategy Officer: Handicapped height     Home Equipment: Emergency planning/management officer - 2 wheels;Walker - 4 wheels;Crutches;Toilet riser;Grab bars - tub/shower;Wheelchair - manual          Prior Functioning/Environment          Comments: on 3Lnc at home. Pt reports switching between walker, crutches, 2WW, 4WW and manual w/c depending on pain level. Has daughter-in-law help with LB dressing some. Supv and setup for bathing. Endoreses several falls, more in 2019 versus 2020.        OT Problem List: Decreased strength;Decreased activity tolerance;Impaired balance (sitting and/or standing);Decreased knowledge of use of DME or AE;Pain      OT Treatment/Interventions: Self-care/ADL training;Therapeutic exercise;Energy conservation;Therapeutic activities;Patient/family education;Balance training    OT Goals(Current goals can be found in the care plan section) Acute Rehab OT Goals Patient Stated Goal: go home OT Goal Formulation: With patient Time For Goal Achievement: 01/14/19 Potential to Achieve Goals: Good  OT Frequency: Min 1X/week  Barriers to D/C:            Co-evaluation              AM-PAC OT "6 Clicks" Daily Activity     Outcome Measure Help from another person eating meals?: None Help from another person taking care of personal grooming?: A  Little Help from another person toileting, which includes using toliet, bedpan, or urinal?: A Lot Help from another person bathing (including washing, rinsing, drying)?: A Lot Help from another person to put on and taking off regular upper body clothing?: A Little Help from another person to put on and taking off regular lower body clothing?: A Lot 6 Click Score: 16   End of Session Equipment Utilized During Treatment: Gait belt;Rolling walker Nurse Communication: Mobility status  Activity Tolerance: Patient limited by pain Patient left: in bed;with call bell/phone within reach;with bed alarm set  OT Visit Diagnosis: Unsteadiness on feet (R26.81);Muscle weakness (generalized) (M62.81);History of falling (Z91.81)                Time: [id number] OT Time Calculation (min): 54 min Charges:  OT General Charges $OT Visit: 1 Visit OT Evaluation $OT Eval Moderate Complexity: 1 Mod OT Treatments $Self Care/Home Management : 23-37 mins $Therapeutic Activity: 8-22 mins  Rejeana BrockAlison Cybill Uriegas, MS, OTR/L ascom [phone] 12/31/18, 4:19 PM

## 2018-12-31 NOTE — Progress Notes (Signed)
OT Cancellation Note  Patient Details Name: Katie Neal MRN: [id number] DOB: Sep 01, 1970   Cancelled Treatment:    Reason Eval/Treat Not Completed: Patient at procedure or test/ unavailable  OT consult received and chart reviewed. Pt working with PT at this time. Will f/u for OT evaluation as able.   Gerrianne Scale, [place], OTR/L ascom [phone] 12/31/18, 10:08 AM

## 2018-12-31 NOTE — Evaluation (Signed)
Physical Therapy Evaluation Patient Details Name: Katie Neal MRN: [id number] DOB: 23-Apr-1970 Today's Date: 12/31/2018   History of Present Illness  48 yo female admitted after progressive generalized weakness, several falls lately with no significant injury however right wrist pain, reports dizziness and has hx of vertigo, cough and SOB. PMH includes CRPS, HLD, asthma, hypothyroidism, depression, anxiety, PTSD, vertigo, seizures  Clinical Impression  Pt is a 48 yo female admitted for above. Pt in bed upon arrival and agreeable to PT eval although reporting severe pain and stating no one could touch her LEs. Pt reports living with her son and DIL although has periods of time where she is home alone. Pt reporting nausea and dizziness as well. Pt reporting feeling uncomfortable in bed and able to sit up and scoot in bed independently. Pt able to get EOB without physical assist. Pt refusing further OOB mobility secondary to severe pain, dizziness and nausea. Pt had multiple times of needing vomit bag due to increased nausea but never threw up. No nystagmus noted when pt sat up and during visual testing. Pt reports having pain all the time. Pt presents with decreased strength, balance, endurance, frequent recent falls and increased pain. Pt would benefit from skilled acute therapy to improve deficits. Pt would benefit from home health PT with 24/7 assist from friends/family to further improve deficits, decrease fall risk and increase independence. If family unable to provide 24/7 assist and depending on mobility progression during hospital stay may need to consider STR following hospital discharge although suspect pt may refuse.     Follow Up Recommendations Home health PT;Supervision/Assistance - 24 hour    Equipment Recommendations  None recommended by PT    Recommendations for Other Services       Precautions / Restrictions Precautions Precautions: Fall Restrictions Weight Bearing  Restrictions: No      Mobility  Bed Mobility Overal bed mobility: Needs Assistance Bed Mobility: Supine to Sit;Sit to Supine     Supine to sit: Min guard;HOB elevated Sit to supine: Min guard;HOB elevated   General bed mobility comments: min guard for safety, pt able to get EOB and return to supine with use of bed rails, pt moved around in bed a lot during session without assistance, pt does not want anyone to touch her legs due to pain  Transfers                 General transfer comment: pt refusing at this time secondary to pain, nausea, and dizziness  Ambulation/Gait             General Gait Details: deferred  Stairs            Wheelchair Mobility    Modified Rankin (Stroke Patients Only)       Balance Overall balance assessment: Mild deficits observed, not formally tested(steady sitting EOB with UE support)                                           Pertinent Vitals/Pain Pain Assessment: Faces Faces Pain Scale: Hurts worst Pain Location: legs, arms, head Pain Descriptors / Indicators: Aching;Sore;Constant;Grimacing;Moaning Pain Intervention(s): Limited activity within patient's tolerance;Monitored during session;Repositioned    Home Living Family/patient expects to be discharged to:: Private residence Living Arrangements: Children(son and DIL) Available Help at Discharge: Available PRN/intermittently;Family Type of Home: House Home Access: Level entry     Home  Layout: One level Home Equipment: Walker - 2 wheels;Walker - 4 wheels;Wheelchair - manual;Shower seat;Crutches;Grab bars - toilet;Grab bars - tub/shower      Prior Function Level of Independence: Needs assistance         Comments: pt reports needing assist at home from son and DIL, pt reports she is unable to cook, sometimes ambulates with 4WW and RW (rollator more than RW) and w/c for mobility     Hand Dominance        Extremity/Trunk Assessment    Upper Extremity Assessment Upper Extremity Assessment: Generalized weakness    Lower Extremity Assessment Lower Extremity Assessment: Generalized weakness(difficult to assess due to severe pain and pt refusing to move, pt able to perform heel slide, partial LAQ, partial seated march)       Communication   Communication: No difficulties  Cognition Arousal/Alertness: Awake/alert Behavior During Therapy: Anxious Overall Cognitive Status: Within Functional Limits for tasks assessed                                 General Comments: very anxious about her pain, feeling the need to urinate (educated on external catheter in place) and just wanting to go home      General Comments      Exercises     Assessment/Plan    PT Assessment Patient needs continued PT services  PT Problem List Decreased strength;Decreased mobility;Decreased range of motion;Decreased activity tolerance;Decreased balance;Decreased knowledge of use of DME;Pain       PT Treatment Interventions DME instruction;Therapeutic exercise;Gait training;Balance training;Stair training;Functional mobility training;Therapeutic activities;Patient/family education    PT Goals (Current goals can be found in the Care Plan section)  Acute Rehab PT Goals Patient Stated Goal: go home PT Goal Formulation: With patient Time For Goal Achievement: 01/14/19 Potential to Achieve Goals: Fair    Frequency Min 2X/week   Barriers to discharge Decreased caregiver support has periods of time where she is home alone    Co-evaluation               AM-PAC PT "6 Clicks" Mobility  Outcome Measure Help needed turning from your back to your side while in a flat bed without using bedrails?: None Help needed moving from lying on your back to sitting on the side of a flat bed without using bedrails?: None Help needed moving to and from a bed to a chair (including a wheelchair)?: A Little Help needed standing up from a  chair using your arms (e.g., wheelchair or bedside chair)?: A Little Help needed to walk in hospital room?: A Little Help needed climbing 3-5 steps with a railing? : A Lot 6 Click Score: 19    End of Session Equipment Utilized During Treatment: Oxygen(1L nasal cannula) Activity Tolerance: Patient limited by pain Patient left: in bed;with call bell/phone within reach;with bed alarm set Nurse Communication: Mobility status PT Visit Diagnosis: Muscle weakness (generalized) (M62.81);Difficulty in walking, not elsewhere classified (R26.2);History of falling (Z91.81)    Time: 1610-9604 PT Time Calculation (min) (ACUTE ONLY): 17 min   Charges:   PT Evaluation $PT Eval Moderate Complexity: 1 Mod          Jossue Rubenstein PT, DPT 11:41 AM,12/31/18   Kathey Simer Drucilla Chalet 12/31/2018, 11:35 AM

## 2018-12-31 NOTE — Progress Notes (Signed)
Assumed care for pt.

## 2018-12-31 NOTE — ED Notes (Signed)
. ED TO INPATIENT HANDOFF REPORT  ED Nurse Name and Phone #: Robertlee Rogacki   S Name/Katie Orleansder [name] 48 y.o. female Room/Bed: ED36A/ED36A  Code Status   Code Status: Full Code  Home/SNF/Other Home Patient oriented to: self, place, time and situation Is this baseline? Yes   Triage Complete: Triage complete  Chief Complaint weakness ems  Triage Note Pt presents to ED via [facility] with c/o generalized weakness, increased falls. Per EMS pt has had several fall over the last few days, fell yesterday and hurt her R wrist, pt's wrist is marked by family PTA to not use. Per EMS pt has congested cough, but has hx of COPD, pt also c/o dizziness but has hx of vertigo, pt also has complex regional pain syndrome and per family pt's permission must be obtained prior to touching her due to pain.  Per EMS VSS en route. Pt presents alert but with eyes closed upon arrival to triage.    Allergies Allergies  Allergen Reactions  . Codeine Itching    Level of Care/Admitting Diagnosis ED Disposition    ED Disposition Condition Comment   Admit  Hospital Area: [facility] [[id number]]  Level of Care: Med-Surg [16]  Covid Evaluation: Confirmed COVID Negative  Diagnosis: UTI (urinary tract infection) [045409]  Admitting Physician: Lorretta Harp [[id number]]  Attending Physician: Lorretta Harp [[id number]]  PT Class (Do Not Modify): Observation [104]  PT Acc Code (Do Not Modify): Observation [10022]       B Medical/Surgery History Past Medical History:  Diagnosis Date  . Arthritis   . Asthma   . CRPS (complex regional pain syndrome type I)   . Depression   . IBS (irritable bowel syndrome)   . Insomnia   . PTSD (post-traumatic stress disorder)    Past Surgical History:  Procedure Laterality Date  . ABDOMINAL HYSTERECTOMY    . ACHILLES TENDON REPAIR  1982  . APPENDECTOMY    . FOOT SURGERY  2002   due to deformity  . HIP SURGERY  2012  . KNEE ARTHROSCOPY    . LAPAROSCOPIC  HYSTERECTOMY  2000  . REPAIR TENDONS LEG     x2     A IV Location/Drains/Wounds Patient Lines/Drains/Airways Status   Active Line/Drains/Airways    Name:   Placement date:   Placement time:   Site:   Days:   Peripheral IV 10/23/17 Right Forearm   10/23/17    1835    Forearm   434   Peripheral IV 12/30/18 Right Antecubital   12/30/18    1517    Antecubital   1   Incision 02/02/11 Abdomen Other (Comment)   02/02/11    1445     2889   Incision 02/02/11 Abdomen Right   02/02/11    1445     2889   Incision 02/02/11 Abdomen Left   02/02/11    1445     2889   Incision 02/02/11 Abdomen Left   02/02/11    1445     2889          Intake/Output Last 24 hours No intake or output data in the 24 hours ending 12/31/18 0103  Labs/Imaging Results for orders placed or performed during the hospital encounter of 12/30/18 (from the past 48 hour(s))  Basic metabolic panel     Status: Abnormal   Collection Time: 12/30/18 12:39 PM  Result Value Ref Range   Sodium 136 135 - 145 mmol/L   Potassium 3.7 3.5 -  5.1 mmol/L   Chloride 99 98 - 111 mmol/L   CO2 20 (L) 22 - 32 mmol/L   Glucose, Bld 126 (H) 70 - 99 mg/dL   BUN 14 6 - 20 mg/dL   Creatinine, Ser 1.04 (H) 0.44 - 1.00 mg/dL   Calcium 9.2 8.9 - 10.3 mg/dL   GFR calc non Af Amer >60 >60 mL/min   GFR calc Af Amer >60 >60 mL/min   Anion gap 17 (H) 5 - 15    Comment: Performed at [facility], [address] [place], [place] [address]  CBC     Status: None   Collection Time: 12/30/18 12:39 PM  Result Value Ref Range   WBC 10.1 4.0 - 10.5 K/uL   RBC 4.62 3.87 - 5.11 MIL/uL   Hemoglobin 13.7 12.0 - 15.0 g/dL   HCT 38.4 36.0 - 46.0 %   MCV 83.1 80.0 - 100.0 fL   MCH 29.7 26.0 - 34.0 pg   MCHC 35.7 30.0 - 36.0 g/dL   RDW 12.1 11.5 - 15.5 %   Platelets 206 150 - 400 K/uL   nRBC 0.0 0.0 - 0.2 %    Comment: Performed at [facility], [address] [place],  [address]  Urinalysis, Complete w Microscopic     Status:  Abnormal   Collection Time: 12/30/18  1:35 PM  Result Value Ref Range   Color, Urine YELLOW (A) YELLOW   APPearance CLOUDY (A) CLEAR   Specific Gravity, Urine 1.015 1.005 - 1.030   pH 7.0 5.0 - 8.0   Glucose, UA NEGATIVE NEGATIVE mg/dL   Hgb urine dipstick NEGATIVE NEGATIVE   Bilirubin Urine NEGATIVE NEGATIVE   Ketones, ur 5 (A) NEGATIVE mg/dL   Protein, ur 30 (A) NEGATIVE mg/dL   Nitrite NEGATIVE NEGATIVE   Leukocytes,Ua LARGE (A) NEGATIVE   RBC / HPF 0-5 0 - 5 RBC/hpf   WBC, UA >50 (H) 0 - 5 WBC/hpf   Bacteria, UA MANY (A) NONE SEEN   Squamous Epithelial / LPF 0-5 0 - 5   WBC Clumps PRESENT    Mucus PRESENT    Amorphous Crystal PRESENT     Comment: Performed at [facility], [address] [place], [place] [address]  SARS CORONAVIRUS 2 (TAT 6-24 HRS) Nasopharyngeal Nasopharyngeal Swab     Status: None   Collection Time: 12/30/18  2:55 PM   Specimen: Nasopharyngeal Swab  Result Value Ref Range   SARS Coronavirus 2 NEGATIVE NEGATIVE    Comment: (NOTE) SARS-CoV-2 target nucleic acids are NOT DETECTED. The SARS-CoV-2 RNA is generally detectable in upper and lower respiratory specimens during the acute phase of infection. Negative results do not preclude SARS-CoV-2 infection, do not rule out co-infections with other pathogens, and should not be used as the sole basis for treatment or other patient management decisions. Negative results must be combined with clinical observations, patient history, and epidemiological information. The expected result is Negative. Fact Sheet for Patients: SugarRoll.be Fact Sheet for Healthcare Providers: https://www.woods-mathews.com/ This test is not yet approved or cleared by the [place] FDA and  has been authorized for detection and/or diagnosis of SARS-CoV-2 by FDA under an Emergency Use Authorization (EUA). This EUA will remain  in effect (meaning this test can be used) for the  duration of the COVID-19 declaration under Section 56 4(b)(1) of the Act, 21 U.S.C. section 360bbb-3(b)(1), unless the authorization is terminated or revoked sooner. Performed at [facility], [place] [address]  [place], [name] [address]    Dg Chest 2 View  Result Date: 12/30/2018 CLINICAL DATA:  Cough. EXAM: CHEST - 2 VIEW COMPARISON:  Chest x-ray and chest CT 03/22/2016. FINDINGS: Mediastinum and hilar structures normal. Postsurgical changes right lung. Mild bibasilar infiltrates. No pleural effusion or pneumothorax. Degenerative change thoracic spine. Lung volumes. IMPRESSION: 1.  Low lung volumes.  Mild bibasilar pulmonary infiltrates. 2.  Postsurgical changes right lung. Electronically Signed   By: Maisie Fus  Register   On: 12/30/2018 13:16   Dg Wrist Complete Right  Result Date: 12/30/2018 CLINICAL DATA:  Fall EXAM: RIGHT WRIST - COMPLETE 3+ VIEW COMPARISON:  08/19/2009 FINDINGS: There is no evidence of fracture or dislocation. There is no evidence of arthropathy or other focal bone abnormality. Soft tissues are unremarkable. IMPRESSION: Negative. Electronically Signed   By: Marlan Palau M.D.   On: 12/30/2018 13:12   Ct Head Wo Contrast  Result Date: 12/30/2018 CLINICAL DATA:  Generalized weakness, increased falls EXAM: CT HEAD WITHOUT CONTRAST TECHNIQUE: Contiguous axial images were obtained from the base of the skull through the vertex without intravenous contrast. COMPARISON:  CT 12/29/2009 FINDINGS: Brain: No evidence of acute infarction, hemorrhage, hydrocephalus, extra-axial collection or mass lesion/mass effect. Symmetric prominence of the ventricles, cisterns and sulci compatible with somewhat age advanced frontal predominant parenchymal volume loss. Vascular: No hyperdense vessel or unexpected calcification. Skull: No calvarial fracture or suspicious osseous lesion. No scalp swelling or hematoma. Sinuses/Orbits: Paranasal sinuses and mastoid air cells are predominantly clear.  Included orbital structures are unremarkable. Other: None IMPRESSION: 1. No acute intracranial abnormality. 2. Mild age advanced frontal predominant parenchymal volume loss. Electronically Signed   By: Kreg Shropshire M.D.   On: 12/30/2018 14:41    Pending Labs Unresulted Labs (From admission, onward)    Start     Ordered   12/31/18 0500  Basic metabolic panel  Tomorrow morning,   STAT     12/31/18 0052   12/31/18 0500  CBC  Tomorrow morning,   STAT     12/31/18 0052   12/31/18 0052  Pregnancy, urine  Once,   STAT     12/31/18 0052   12/31/18 0052  Culture, expectorated sputum-assessment  Once,   STAT     12/31/18 0052   12/31/18 0052  HIV Antibody (routine testing w rflx)  (HIV Antibody (Routine testing w reflex) panel)  Once,   STAT     12/31/18 0052   12/30/18 1418  Blood Culture (routine x 2)  BLOOD CULTURE X 2,   STAT     12/30/18 1417   12/30/18 1413  Urine culture  Add-on,   AD     12/30/18 1412          Vitals/Pain Today's Vitals   12/30/18 1559 12/30/18 1630 12/30/18 1724 12/30/18 2000  BP:  140/81 133/60 134/68  Pulse:  98 91   Resp:  (!) 21 19   Temp:      TempSrc:      SpO2:  98% 100% 99%  Weight:      Height:      PainSc: 8        Isolation Precautions No active isolations  Medications Medications  cefTRIAXone (ROCEPHIN) 2 g in sodium chloride 0.9 % 100 mL IVPB (0 g Intravenous Stopped 12/30/18 1543)  azithromycin (ZITHROMAX) 500 mg in sodium chloride 0.9 % 250 mL IVPB (0 mg Intravenous Stopped 12/30/18 1719)  ipratropium (ATROVENT HFA) inhaler 2 puff (2 puffs Inhalation Given 12/30/18 2052)  albuterol (VENTOLIN HFA) 108 (90 Base) MCG/ACT inhaler 2 puff (2 puffs Inhalation Given 12/30/18 1522)  enoxaparin (LOVENOX) injection 40 mg (has no administration in time range)  ondansetron (ZOFRAN) injection 4 mg (4 mg Intravenous Given 12/30/18 1720)  acetaminophen (TYLENOL) tablet 650 mg (650 mg Oral Given 12/30/18 2100)  guaiFENesin (MUCINEX) 12 hr tablet 600 mg (600  mg Oral Given 12/30/18 2116)    And  dextromethorphan (DELSYM) 30 MG/5ML liquid 30 mg (30 mg Oral Given 12/30/18 2115)  LORazepam (ATIVAN) injection 1 mg (has no administration in time range)  levorphanol (LEVODROMORAN) tablet 2 mg ( Oral Canceled Entry 12/31/18 0102)  metoprolol succinate (TOPROL-XL) 24 hr tablet 25 mg (has no administration in time range)  simvastatin (ZOCOR) tablet 10 mg (has no administration in time range)  amitriptyline (ELAVIL) tablet 25 mg (has no administration in time range)  Gabapentin Enacarbil TBCR 600 mg (has no administration in time range)  hydrOXYzine (ATARAX/VISTARIL) tablet 25 mg (has no administration in time range)  traZODone (DESYREL) tablet 200 mg (has no administration in time range)  venlafaxine XR (EFFEXOR-XR) 24 hr capsule 225 mg (has no administration in time range)  levothyroxine (SYNTHROID) tablet 137 mcg (has no administration in time range)  dicyclomine (BENTYL) tablet 20 mg (has no administration in time range)  pantoprazole (PROTONIX) EC tablet 40 mg (has no administration in time range)  lamoTRIgine (LAMICTAL) tablet 100-200 mg (has no administration in time range)  tiZANidine (ZANAFLEX) tablet 4 mg (has no administration in time range)  multivitamin with minerals tablet 1 tablet (has no administration in time range)  mometasone-formoterol (DULERA) 100-5 MCG/ACT inhaler 2 puff (has no administration in time range)  fluticasone (FLONASE) 50 MCG/ACT nasal spray 2 spray (has no administration in time range)  montelukast (SINGULAIR) tablet 10 mg (has no administration in time range)  Olopatadine HCl 0.7 % SOLN 1 drop (has no administration in time range)  sodium chloride flush (NS) 0.9 % injection 3 mL (3 mLs Intravenous Given 12/30/18 1521)  sodium chloride 0.9 % bolus 1,000 mL (1,000 mLs Intravenous New Bag/Given 12/30/18 1521)    Mobility walks with person assist High fall risk   Focused Assessments Renal Assessment  Handoff:       R Recommendations: See Admitting Provider Note  Report given to:   Additional Notes:

## 2019-01-01 LAB — CBC
HCT: 37.6 % (ref 36.0–46.0)
Hemoglobin: 13.3 g/dL (ref 12.0–15.0)
MCH: 30 pg (ref 26.0–34.0)
MCHC: 35.4 g/dL (ref 30.0–36.0)
MCV: 84.9 fL (ref 80.0–100.0)
Platelets: 184 10*3/uL (ref 150–400)
RBC: 4.43 MIL/uL (ref 3.87–5.11)
RDW: 12.6 % (ref 11.5–15.5)
WBC: 8 10*3/uL (ref 4.0–10.5)
nRBC: 0 % (ref 0.0–0.2)

## 2019-01-01 LAB — TSH: TSH: 4.727 u[IU]/mL — ABNORMAL HIGH (ref 0.350–4.500)

## 2019-01-01 LAB — URINE CULTURE: Culture: >=100000 — AB

## 2019-01-01 MED ORDER — DM-GUAIFENESIN ER 30-600 MG PO TB12: 1.0000 | ORAL_TABLET | Freq: Two times a day (BID) | ORAL | 0 refills | Status: DC

## 2019-01-01 MED ORDER — SULFAMETHOXAZOLE-TRIMETHOPRIM 800-160 MG PO TABS: 1.0000 | ORAL_TABLET | Freq: Two times a day (BID) | ORAL | 0 refills | Status: AC

## 2019-01-01 MED ORDER — DM-GUAIFENESIN ER 30-600 MG PO TB12
1.0000 | ORAL_TABLET | Freq: Two times a day (BID) | ORAL | Status: DC
  Filled 2019-01-01: qty 1

## 2019-01-01 NOTE — Progress Notes (Signed)
Progress/Discharge note: Writer went over discharge instructions with patient, she verbalized understanding.  I also gave pt her bottle of Levorphanol 2mg  that pharmacy was holding for her.  Pt will let me know when relative arrives to transport her home.

## 2019-01-01 NOTE — Discharge Summary (Signed)
Physician Discharge Summary  [name] ZOX:[id number] DOB: 1970/01/31 DOA: 12/30/2018  PCP: Katie HumphreyGeorge, Katie A, MD  Admit date: 12/30/2018 Discharge date: 01/01/2019  Admitted From: Home Disposition: Home  Recommendations for Outpatient Follow-up:  1. Follow up with PCP in 1-2 weeks 2. Please obtain BMP/CBC in one week 3. Her TSH was mildly elevated during hospitalization, please recheck in few weeks to adjust her Synthroid. 4. Please follow up on the following pending results:  Home Health: Yes Equipment/Devices: None Discharge Condition: Stable CODE STATUS: Full Diet recommendation: Heart Healthy / Carb Modified / Regular / Dysphagia   Brief/Interim Summary: Katie BrunnerMichelle L Tilleyis Neal 48 y.o.femalewith medical history significant ofCRPS (complex regional pain syndrome type I),hyperlipidemia, asthma, hypothyroidism, depression, anxiety, PTSD, IBS, insomnia, seizure, vertigo, who presents with generalized weakness, multiple falls, dysuria. No significant injury. Patient was having upper respiratory symptoms.  RVP and Covid testing was negative. Her urine culture grew pansensitive E. Coli, she was treated with ceftriaxone in the hospital and discharged on 3 more days of Bactrim. She was also found to have  mildly elevated TSH, no changes were made to her Synthroid as that can happen during acute illness.  She needs to follow-up with PCP to repeat TSH in few weeks for titration of her dose.  PT/OT evaluation was done for her complaint of generalized weakness and they were recommending home health PT and OT which was ordered.  Discharge Diagnoses:  Principal Problem:   Lower urinary tract infectious disease Active Problems:   CRPS (complex regional pain syndrome type I)   Asthma   Depression with anxiety   HLD (hyperlipidemia)   Fall   Hypothyroidism   CAP (community acquired pneumonia)   Seizure (HCC)   Weakness    Discharge Instructions  Discharge Instructions    Diet - low sodium heart healthy   Complete by: As directed    Discharge instructions   Complete by: As directed    Was pleasure taking care of you. I am giving you Bactrim for 3 more days for your urinary tract infection. Your TSH is little high, I am not making any changes to your Synthroid, please follow-up with your PCP so they can repeat that test in few weeks to make adjustment to your medicine. We are also ordering some home health physical therapy for you.   Increase activity slowly   Complete by: As directed      Allergies as of 01/01/2019      Reactions   Codeine Itching      Medication List    TAKE these medications   amitriptyline 25 MG tablet Commonly known as: ELAVIL Take 25 mg by mouth at bedtime.   budesonide-formoterol 80-4.5 MCG/ACT inhaler Commonly known as: SYMBICORT Inhale 2 puffs into the lungs 2 (two) times daily.   dextromethorphan-guaiFENesin 30-600 MG 12hr tablet Commonly known as: MUCINEX DM Take 1 tablet by mouth 2 (two) times daily.   dicyclomine 20 MG tablet Commonly known as: BENTYL Take 20 mg by mouth 3 (three) times daily before meals.   fluticasone 50 MCG/ACT nasal spray Commonly known as: FLONASE Place 2 sprays into both nostrils daily.   Horizant 600 MG Tbcr Generic drug: Gabapentin Enacarbil Take 600 mg by mouth 2 (two) times daily.   hydrOXYzine 25 MG tablet Commonly known as: ATARAX/VISTARIL Take 25 mg by mouth 3 (three) times daily as needed for anxiety.   ipratropium 0.03 % nasal spray Commonly known as: ATROVENT Place 2 sprays into both nostrils every 12 (  twelve) hours.   lamoTRIgine 100 MG tablet Commonly known as: LAMICTAL Take 100-200 mg by mouth See admin instructions. Take 1 tablet ( ) by mouth every morning and take 2 tablets ( ) by mouth at bedtime   levorphanol 2 MG tablet Commonly known as: LEVODROMORAN Take 2 mg by mouth every 8 (eight) hours.   levothyroxine 137 MCG tablet Commonly known as:  SYNTHROID Take 137 mcg by mouth daily.   metoprolol succinate 25 MG 24 hr tablet Commonly known as: TOPROL-XL Take 25 mg by mouth daily.   montelukast 10 MG tablet Commonly known as: SINGULAIR Take 10 mg by mouth daily.   multivitamin with minerals Tabs tablet Take 1 tablet by mouth daily.   ondansetron 4 MG disintegrating tablet Commonly known as: ZOFRAN-ODT Take 4 mg by mouth every 8 (eight) hours as needed for nausea.   pantoprazole 40 MG tablet Commonly known as: PROTONIX Take 40 mg by mouth daily.   Pazeo 0.7 % Soln Generic drug: Olopatadine HCl Place 1 drop into both eyes 2 (two) times daily as needed (allergy symptoms).   simvastatin 10 MG tablet Commonly known as: ZOCOR Take 10 mg by mouth daily.   sulfamethoxazole-trimethoprim 800-160 MG tablet Commonly known as: BACTRIM DS Take 1 tablet by mouth 2 (two) times daily for 3 days.   tiotropium 18 MCG inhalation capsule Commonly known as: SPIRIVA Place 18 mcg into inhaler and inhale daily.   tiZANidine 4 MG tablet Commonly known as: ZANAFLEX Take 4 mg by mouth every 8 (eight) hours as needed for muscle spasms.   traZODone 100 MG tablet Commonly known as: DESYREL Take 2 tablets (200 mg total) by mouth at bedtime. Needs office visit   venlafaxine XR 75 MG 24 hr capsule Commonly known as: EFFEXOR-XR Take 3 capsules (225 mg total) by mouth daily.   vitamin C 500 MG tablet Commonly known as: ASCORBIC ACID Take 1,000 mg by mouth daily.       Allergies  Allergen Reactions  . Codeine Itching    Consultations:    Procedures/Studies: DG Chest 2 View  Result Date: 12/30/2018 CLINICAL DATA:  Cough. EXAM: CHEST - 2 VIEW COMPARISON:  Chest x-ray and chest CT 03/22/2016. FINDINGS: Mediastinum and hilar structures normal. Postsurgical changes right lung. Mild bibasilar infiltrates. No pleural effusion or pneumothorax. Degenerative change thoracic spine. Lung volumes. IMPRESSION: 1.  Low lung volumes.  Mild  bibasilar pulmonary infiltrates. 2.  Postsurgical changes right lung. Electronically Signed   By: Maisie Fus  Register   On: 12/30/2018 13:16   DG Wrist Complete Right  Result Date: 12/30/2018 CLINICAL DATA:  Fall EXAM: RIGHT WRIST - COMPLETE 3+ VIEW COMPARISON:  08/19/2009 FINDINGS: There is no evidence of fracture or dislocation. There is no evidence of arthropathy or other focal bone abnormality. Soft tissues are unremarkable. IMPRESSION: Negative. Electronically Signed   By: Marlan Palau M.D.   On: 12/30/2018 13:12   CT Head Wo Contrast  Result Date: 12/30/2018 CLINICAL DATA:  Generalized weakness, increased falls EXAM: CT HEAD WITHOUT CONTRAST TECHNIQUE: Contiguous axial images were obtained from the base of the skull through the vertex without intravenous contrast. COMPARISON:  CT 12/29/2009 FINDINGS: Brain: No evidence of acute infarction, hemorrhage, hydrocephalus, extra-axial collection or mass lesion/mass effect. Symmetric prominence of the ventricles, cisterns and sulci compatible with somewhat age advanced frontal predominant parenchymal volume loss. Vascular: No hyperdense vessel or unexpected calcification. Skull: No calvarial fracture or suspicious osseous lesion. No scalp swelling or hematoma. Sinuses/Orbits: Paranasal sinuses and mastoid air cells  are predominantly clear. Included orbital structures are unremarkable. Other: None IMPRESSION: 1. No acute intracranial abnormality. 2. Mild age advanced frontal predominant parenchymal volume loss. Electronically Signed   By: Kreg Shropshire M.D.   On: 12/30/2018 14:41   SLEEP STUDY DOCUMENTS  Result Date: 12/25/2018 Ordered by an unspecified provider.  Subjective: Was feeling better this morning.  Denies worsening of her pain.  Cough is improving.  Discharge Exam: Vitals:   12/31/18 1747 01/01/19 0003  BP: 125/87 120/72  Pulse: 92 87  Resp:  18  Temp:  98.4 F (36.9 C)  SpO2: 98% 98%   Vitals:   12/31/18 0927 12/31/18 1442 12/31/18  1747 01/01/19 0003  BP: (!) 132/91 (!) 132/118 125/87 120/72  Pulse: 72 87 92 87  Resp: 17 17  18   Temp: 98.1 F (36.7 C) 98.5 F (36.9 C)  98.4 F (36.9 C)  TempSrc: Oral Oral    SpO2: 98% 99% 98% 98%  Weight:      Height:       General: Pt is alert, awake, not in acute distress Cardiovascular: RRR, S1/S2 +, no rubs, no gallops Respiratory: CTA bilaterally, no wheezing, no rhonchi Abdominal: Soft, NT, ND, bowel sounds + Extremities: no edema, no cyanosis   The results of significant diagnostics from this hospitalization (including imaging, microbiology, ancillary and laboratory) are listed below for reference.     Microbiology: Recent Results (from the past 240 hour(s))  Urine culture     Status: Abnormal   Collection Time: 12/30/18  2:13 PM   Specimen: Urine, Random  Result Value Ref Range Status   Specimen Description   Final    URINE, RANDOM Performed at [facility], [address] [place], [name]    Special Requests   Final    NONE Performed at [facility], [address] [place], [name]    Culture >=100,000 COLONIES/mL ESCHERICHIA COLI (Neal)  Final   Report Status 01/01/2019 FINAL  Final   Organism ID, Bacteria ESCHERICHIA COLI (Neal)  Final      Susceptibility   Escherichia coli - MIC*    AMPICILLIN 16 INTERMEDIATE Intermediate     CEFAZOLIN <=4 SENSITIVE Sensitive     CEFTRIAXONE <=1 SENSITIVE Sensitive     CIPROFLOXACIN <=0.25 SENSITIVE Sensitive     GENTAMICIN <=1 SENSITIVE Sensitive     IMIPENEM <=0.25 SENSITIVE Sensitive     NITROFURANTOIN 32 SENSITIVE Sensitive     TRIMETH/SULFA <=20 SENSITIVE Sensitive     AMPICILLIN/SULBACTAM 8 SENSITIVE Sensitive     * >=100,000 COLONIES/mL ESCHERICHIA COLI  SARS CORONAVIRUS 2 (TAT 6-24 HRS) Nasopharyngeal Nasopharyngeal Swab     Status: None   Collection Time: 12/30/18  2:55 PM   Specimen: Nasopharyngeal Swab  Result Value Ref Range Status   SARS Coronavirus 2 NEGATIVE  NEGATIVE Final    Comment: (NOTE) SARS-CoV-2 target nucleic acids are NOT DETECTED. The SARS-CoV-2 RNA is generally detectable in upper and lower respiratory specimens during the acute phase of infection. Negative results do not preclude SARS-CoV-2 infection, do not rule out co-infections with other pathogens, and should not be used as the sole basis for treatment or other patient management decisions. Negative results must be combined with clinical observations, patient history, and epidemiological information. The expected result is Negative. Fact Sheet for Patients: 14/08/20 Fact Sheet for Healthcare Providers: HairSlick.no This test is not yet approved or cleared by the quierodirigir.com FDA and  has been authorized for detection and/or diagnosis  of SARS-CoV-2 by FDA under an Emergency Use Authorization (EUA). This EUA will remain  in effect (meaning this test can be used) for the duration of the COVID-19 declaration under Section 56 4(b)(1) of the Act, 21 U.S.C. section 360bbb-3(b)(1), unless the authorization is terminated or revoked sooner. Performed at [facility], [address] [place], [name] [address]   Blood Culture (routine x 2)     Status: None (Preliminary result)   Collection Time: 12/30/18  3:15 PM   Specimen: BLOOD  Result Value Ref Range Status   Specimen Description BLOOD RIGHT ANTECUBITAL  Final   Special Requests   Final    BOTTLES DRAWN AEROBIC AND ANAEROBIC Blood Culture adequate volume   Culture   Final    NO GROWTH 2 DAYS Performed at [facility], [address] [place], [name] [address]    Report Status PENDING  Incomplete  Blood Culture (routine x 2)     Status: None (Preliminary result)   Collection Time: 12/30/18  3:15 PM   Specimen: BLOOD  Result Value Ref Range Status   Specimen Description BLOOD LEFT ANTECUBITAL  Final   Special Requests   Final    BOTTLES DRAWN  AEROBIC AND ANAEROBIC Blood Culture adequate volume   Culture   Final    NO GROWTH 2 DAYS Performed at [facility], [address] [place], [name] [address]    Report Status PENDING  Incomplete  Respiratory Panel by PCR     Status: None   Collection Time: 12/31/18 12:10 PM   Specimen: Nasopharyngeal Swab; Respiratory  Result Value Ref Range Status   Adenovirus NOT DETECTED NOT DETECTED Final   Coronavirus 229E NOT DETECTED NOT DETECTED Final    Comment: (NOTE) The Coronavirus on the Respiratory Panel, DOES NOT test for the novel  Coronavirus (2019 nCoV)    Coronavirus HKU1 NOT DETECTED NOT DETECTED Final   Coronavirus NL63 NOT DETECTED NOT DETECTED Final   Coronavirus OC43 NOT DETECTED NOT DETECTED Final   Metapneumovirus NOT DETECTED NOT DETECTED Final   Rhinovirus / Enterovirus NOT DETECTED NOT DETECTED Final   Influenza Neal NOT DETECTED NOT DETECTED Final   Influenza B NOT DETECTED NOT DETECTED Final   Parainfluenza Virus 1 NOT DETECTED NOT DETECTED Final   Parainfluenza Virus 2 NOT DETECTED NOT DETECTED Final   Parainfluenza Virus 3 NOT DETECTED NOT DETECTED Final   Parainfluenza Virus 4 NOT DETECTED NOT DETECTED Final   Respiratory Syncytial Virus NOT DETECTED NOT DETECTED Final   Bordetella pertussis NOT DETECTED NOT DETECTED Final   Chlamydophila pneumoniae NOT DETECTED NOT DETECTED Final   Mycoplasma pneumoniae NOT DETECTED NOT DETECTED Final    Comment: Performed at [facility], [address] [place], [name] [address]     Labs: BNP (last 3 results) No results for input(s): BNP in the last 8760 hours. Basic Metabolic Panel: Recent Labs  Lab 12/30/18 1239 12/31/18 0600  NA 136 139  K 3.7 3.5  CL 99 106  CO2 20* 22  GLUCOSE 126* 109*  BUN 14 13  CREATININE 1.04* 0.80  CALCIUM 9.2 9.0   Liver Function Tests: No results for input(s): AST, ALT, ALKPHOS, BILITOT, PROT, ALBUMIN in the last 168 hours. No results for input(s): LIPASE, AMYLASE  in the last 168 hours. No results for input(s): AMMONIA in the last 168 hours. CBC: Recent Labs  Lab 12/30/18 1239 12/31/18 0600 01/01/19 0420  WBC 10.1 11.7* 8.0  HGB 13.7 12.8  13.3  HCT 38.4 35.9* 37.6  MCV 83.1 83.1 84.9  PLT 206 193 184   Cardiac Enzymes: No results for input(s): CKTOTAL, CKMB, CKMBINDEX, TROPONINI in the last 168 hours. BNP: Invalid input(s): POCBNP CBG: No results for input(s): GLUCAP in the last 168 hours. D-Dimer No results for input(s): DDIMER in the last 72 hours. Hgb A1c No results for input(s): HGBA1C in the last 72 hours. Lipid Profile No results for input(s): CHOL, HDL, LDLCALC, TRIG, CHOLHDL, LDLDIRECT in the last 72 hours. Thyroid function studies Recent Labs    01/01/19 0420  TSH 4.727*   Anemia work up No results for input(s): VITAMINB12, FOLATE, FERRITIN, TIBC, IRON, RETICCTPCT in the last 72 hours. Urinalysis    Component Value Date/Time   COLORURINE YELLOW (Neal) 12/30/2018 1335   APPEARANCEUR CLOUDY (Neal) 12/30/2018 1335   LABSPEC 1.015 12/30/2018 1335   PHURINE 7.0 12/30/2018 1335   GLUCOSEU NEGATIVE 12/30/2018 1335   HGBUR NEGATIVE 12/30/2018 1335   BILIRUBINUR NEGATIVE 12/30/2018 1335   KETONESUR 5 (Neal) 12/30/2018 1335   PROTEINUR 30 (Neal) 12/30/2018 1335   UROBILINOGEN 0.2 03/02/2009 1415   NITRITE NEGATIVE 12/30/2018 1335   LEUKOCYTESUR LARGE (Neal) 12/30/2018 1335   Sepsis Labs Invalid input(s): PROCALCITONIN,  WBC,  LACTICIDVEN Microbiology Recent Results (from the past 240 hour(s))  Urine culture     Status: Abnormal   Collection Time: 12/30/18  2:13 PM   Specimen: Urine, Random  Result Value Ref Range Status   Specimen Description   Final    URINE, RANDOM Performed at [facility], [address] [place], [place] [address]    Special Requests   Final    NONE Performed at [facility], [address] [place], [place] [address]    Culture >=100,000 COLONIES/mL ESCHERICHIA COLI (Neal)  Final    Report Status 01/01/2019 FINAL  Final   Organism ID, Bacteria ESCHERICHIA COLI (Neal)  Final      Susceptibility   Escherichia coli - MIC*    AMPICILLIN 16 INTERMEDIATE Intermediate     CEFAZOLIN <=4 SENSITIVE Sensitive     CEFTRIAXONE <=1 SENSITIVE Sensitive     CIPROFLOXACIN <=0.25 SENSITIVE Sensitive     GENTAMICIN <=1 SENSITIVE Sensitive     IMIPENEM <=0.25 SENSITIVE Sensitive     NITROFURANTOIN 32 SENSITIVE Sensitive     TRIMETH/SULFA <=20 SENSITIVE Sensitive     AMPICILLIN/SULBACTAM 8 SENSITIVE Sensitive     * >=100,000 COLONIES/mL ESCHERICHIA COLI  SARS CORONAVIRUS 2 (TAT 6-24 HRS) Nasopharyngeal Nasopharyngeal Swab     Status: None   Collection Time: 12/30/18  2:55 PM   Specimen: Nasopharyngeal Swab  Result Value Ref Range Status   SARS Coronavirus 2 NEGATIVE NEGATIVE Final    Comment: (NOTE) SARS-CoV-2 target nucleic acids are NOT DETECTED. The SARS-CoV-2 RNA is generally detectable in upper and lower respiratory specimens during the acute phase of infection. Negative results do not preclude SARS-CoV-2 infection, do not rule out co-infections with other pathogens, and should not be used as the sole basis for treatment or other patient management decisions. Negative results must be combined with clinical observations, patient history, and epidemiological information. The expected result is Negative. Fact Sheet for Patients: SugarRoll.be Fact Sheet for Healthcare Providers: https://www.woods-mathews.com/ This test is not yet approved or cleared by the [place] FDA and  has been authorized for detection and/or diagnosis of SARS-CoV-2 by FDA under an Emergency Use Authorization (EUA). This EUA will remain  in effect (meaning this test can be used)  for the duration of the COVID-19 declaration under Section 56 4(b)(1) of the Act, 21 U.S.C. section 360bbb-3(b)(1), unless the authorization is terminated or revoked sooner. Performed at  [facility], [address] [place], [name] [address]   Blood Culture (routine x 2)     Status: None (Preliminary result)   Collection Time: 12/30/18  3:15 PM   Specimen: BLOOD  Result Value Ref Range Status   Specimen Description BLOOD RIGHT ANTECUBITAL  Final   Special Requests   Final    BOTTLES DRAWN AEROBIC AND ANAEROBIC Blood Culture adequate volume   Culture   Final    NO GROWTH 2 DAYS Performed at [facility], [address] [place], [name] [address]    Report Status PENDING  Incomplete  Blood Culture (routine x 2)     Status: None (Preliminary result)   Collection Time: 12/30/18  3:15 PM   Specimen: BLOOD  Result Value Ref Range Status   Specimen Description BLOOD LEFT ANTECUBITAL  Final   Special Requests   Final    BOTTLES DRAWN AEROBIC AND ANAEROBIC Blood Culture adequate volume   Culture   Final    NO GROWTH 2 DAYS Performed at [facility], [address] [place], [name] [address]    Report Status PENDING  Incomplete  Respiratory Panel by PCR     Status: None   Collection Time: 12/31/18 12:10 PM   Specimen: Nasopharyngeal Swab; Respiratory  Result Value Ref Range Status   Adenovirus NOT DETECTED NOT DETECTED Final   Coronavirus 229E NOT DETECTED NOT DETECTED Final    Comment: (NOTE) The Coronavirus on the Respiratory Panel, DOES NOT test for the novel  Coronavirus (2019 nCoV)    Coronavirus HKU1 NOT DETECTED NOT DETECTED Final   Coronavirus NL63 NOT DETECTED NOT DETECTED Final   Coronavirus OC43 NOT DETECTED NOT DETECTED Final   Metapneumovirus NOT DETECTED NOT DETECTED Final   Rhinovirus / Enterovirus NOT DETECTED NOT DETECTED Final   Influenza Neal NOT DETECTED NOT DETECTED Final   Influenza B NOT DETECTED NOT DETECTED Final   Parainfluenza Virus 1 NOT DETECTED NOT DETECTED Final   Parainfluenza Virus 2 NOT DETECTED NOT DETECTED Final   Parainfluenza Virus 3 NOT DETECTED NOT DETECTED Final   Parainfluenza Virus 4 NOT  DETECTED NOT DETECTED Final   Respiratory Syncytial Virus NOT DETECTED NOT DETECTED Final   Bordetella pertussis NOT DETECTED NOT DETECTED Final   Chlamydophila pneumoniae NOT DETECTED NOT DETECTED Final   Mycoplasma pneumoniae NOT DETECTED NOT DETECTED Final    Comment: Performed at [facility], [address] [place], [name] [address]    Time coordinating discharge: Over 30 minutes  SIGNED:  Arnetha Courser, MD  [facility] 01/01/2019, 10:18 AM Pager [phone]  If 7PM-7AM, please contact night-coverage [email or web address] Password [email or web address]  This record has been created using Conservation officer, historic buildings. Errors have been sought and corrected,but may not always be located. Such creation errors do not reflect on the standard of care.

## 2019-01-01 NOTE — TOC Transition Note (Signed)
Transition of Care [facility]) - CM/SW Discharge Note   Patient Details  Name: ZENOLA DEZARN MRN: [id number] Date of Birth: December 24, 1970  [facility] CM/SW Contact:  Victorino Dike, RN Phone Number: 01/01/2019, 10:26 AM   Clinical Narrative:     Met with patient to discuss discharge plan.  Lives at home with adult children children.  Will have [place] PT and OT through [place].  No other needs at this time.   Final next level of care: [place] Barriers to Discharge: Barriers Resolved   Patient Goals and CMS Choice Patient states their goals for this hospitalization and ongoing recovery are:: to return home with home health   Choice offered to / list presented to : Patient  Discharge Placement                       Discharge Plan and Services   Discharge Planning Services: CM Consult Post Acute Care Choice: Home Health          DME Arranged: N/A         HH Arranged: PT, OT [place] Agency: [place] ([place]) Date HH Agency Contacted: 01/01/19 Time HH Agency Contacted: [phone] Representative spoke with at [place]: [place] ([place]) Interventions     Readmission Risk Interventions No flowsheet data found.

## 2019-01-01 NOTE — Plan of Care (Signed)
Emotional support given to patient.

## 2019-01-04 LAB — CULTURE, BLOOD (ROUTINE X 2)
Culture: NO GROWTH
Culture: NO GROWTH
Special Requests: ADEQUATE
Special Requests: ADEQUATE

## 2019-04-13 ENCOUNTER — Other Ambulatory Visit: Payer: Self-pay | Admitting: Acute Care

## 2019-04-13 DIAGNOSIS — R42 Dizziness and giddiness: Secondary | ICD-10-CM

## 2019-04-24 ENCOUNTER — Ambulatory Visit: Payer: Medicare Other

## 2019-06-27 ENCOUNTER — Other Ambulatory Visit: Payer: Self-pay

## 2019-06-27 ENCOUNTER — Inpatient Hospital Stay
Admission: EM | Admit: 2019-06-27 | Discharge: 2019-06-29 | DRG: 854 | Disposition: A | Discharge status: Home / Self Care | Payer: Medicare Other | Attending: Internal Medicine | Admitting: Internal Medicine

## 2019-06-27 ENCOUNTER — Emergency Department: Payer: Medicare Other

## 2019-06-27 ENCOUNTER — Emergency Department: Payer: Medicare Other | Admitting: Anesthesiology

## 2019-06-27 ENCOUNTER — Ambulatory Visit: Admit: 2019-06-27 | Payer: Medicare Other | Admitting: Urology

## 2019-06-27 ENCOUNTER — Encounter
Admission: EM | Disposition: A | Discharge status: Home / Self Care | Payer: Self-pay | Source: Home / Self Care | Attending: Internal Medicine

## 2019-06-27 DIAGNOSIS — Z7951 Long term (current) use of inhaled steroids: Secondary | ICD-10-CM | POA: Diagnosis not present

## 2019-06-27 DIAGNOSIS — N390 Urinary tract infection, site not specified: Secondary | ICD-10-CM

## 2019-06-27 DIAGNOSIS — A419 Sepsis, unspecified organism: Secondary | ICD-10-CM | POA: Diagnosis not present

## 2019-06-27 DIAGNOSIS — N136 Pyonephrosis: Secondary | ICD-10-CM | POA: Diagnosis present

## 2019-06-27 DIAGNOSIS — R509 Fever, unspecified: Secondary | ICD-10-CM | POA: Diagnosis not present

## 2019-06-27 DIAGNOSIS — K219 Gastro-esophageal reflux disease without esophagitis: Secondary | ICD-10-CM | POA: Diagnosis present

## 2019-06-27 DIAGNOSIS — Z79890 Hormone replacement therapy (postmenopausal): Secondary | ICD-10-CM

## 2019-06-27 DIAGNOSIS — E669 Obesity, unspecified: Secondary | ICD-10-CM | POA: Diagnosis present

## 2019-06-27 DIAGNOSIS — N10 Acute tubulo-interstitial nephritis: Secondary | ICD-10-CM | POA: Diagnosis not present

## 2019-06-27 DIAGNOSIS — N201 Calculus of ureter: Secondary | ICD-10-CM | POA: Diagnosis present

## 2019-06-27 DIAGNOSIS — R569 Unspecified convulsions: Secondary | ICD-10-CM

## 2019-06-27 DIAGNOSIS — G40909 Epilepsy, unspecified, not intractable, without status epilepticus: Secondary | ICD-10-CM | POA: Diagnosis present

## 2019-06-27 DIAGNOSIS — J4520 Mild intermittent asthma, uncomplicated: Secondary | ICD-10-CM

## 2019-06-27 DIAGNOSIS — A4151 Sepsis due to Escherichia coli [E. coli]: Principal | ICD-10-CM | POA: Diagnosis present

## 2019-06-27 DIAGNOSIS — F4310 Post-traumatic stress disorder, unspecified: Secondary | ICD-10-CM | POA: Diagnosis present

## 2019-06-27 DIAGNOSIS — R0902 Hypoxemia: Secondary | ICD-10-CM | POA: Diagnosis present

## 2019-06-27 DIAGNOSIS — Z79899 Other long term (current) drug therapy: Secondary | ICD-10-CM | POA: Diagnosis not present

## 2019-06-27 DIAGNOSIS — Z9981 Dependence on supplemental oxygen: Secondary | ICD-10-CM | POA: Diagnosis not present

## 2019-06-27 DIAGNOSIS — Z20822 Contact with and (suspected) exposure to covid-19: Secondary | ICD-10-CM | POA: Diagnosis present

## 2019-06-27 DIAGNOSIS — Z90710 Acquired absence of both cervix and uterus: Secondary | ICD-10-CM

## 2019-06-27 DIAGNOSIS — Z6833 Body mass index (BMI) 33.0-33.9, adult: Secondary | ICD-10-CM

## 2019-06-27 DIAGNOSIS — R1031 Right lower quadrant pain: Secondary | ICD-10-CM

## 2019-06-27 DIAGNOSIS — E039 Hypothyroidism, unspecified: Secondary | ICD-10-CM | POA: Diagnosis present

## 2019-06-27 DIAGNOSIS — G9050 Complex regional pain syndrome I, unspecified: Secondary | ICD-10-CM | POA: Diagnosis not present

## 2019-06-27 DIAGNOSIS — E872 Acidosis: Secondary | ICD-10-CM | POA: Diagnosis present

## 2019-06-27 DIAGNOSIS — J45909 Unspecified asthma, uncomplicated: Secondary | ICD-10-CM | POA: Diagnosis present

## 2019-06-27 DIAGNOSIS — G9059 Complex regional pain syndrome I of other specified site: Secondary | ICD-10-CM | POA: Diagnosis present

## 2019-06-27 DIAGNOSIS — Z87891 Personal history of nicotine dependence: Secondary | ICD-10-CM | POA: Diagnosis not present

## 2019-06-27 DIAGNOSIS — F32A Depression, unspecified: Secondary | ICD-10-CM | POA: Diagnosis present

## 2019-06-27 DIAGNOSIS — F418 Other specified anxiety disorders: Secondary | ICD-10-CM | POA: Diagnosis present

## 2019-06-27 DIAGNOSIS — E785 Hyperlipidemia, unspecified: Secondary | ICD-10-CM | POA: Diagnosis present

## 2019-06-27 HISTORY — PX: CYSTOSCOPY WITH STENT PLACEMENT: SHX5790

## 2019-06-27 LAB — DIFFERENTIAL
Abs Immature Granulocytes: 0.18 10*3/uL — ABNORMAL HIGH (ref 0.00–0.07)
Basophils Absolute: 0.1 10*3/uL (ref 0.0–0.1)
Basophils Relative: 0 %
Eosinophils Absolute: 0 10*3/uL (ref 0.0–0.5)
Eosinophils Relative: 0 %
Immature Granulocytes: 1 %
Lymphocytes Relative: 4 %
Lymphs Abs: 1.1 10*3/uL (ref 0.7–4.0)
Monocytes Absolute: 1.4 10*3/uL — ABNORMAL HIGH (ref 0.1–1.0)
Monocytes Relative: 5 %
Neutro Abs: 23.9 10*3/uL — ABNORMAL HIGH (ref 1.7–7.7)
Neutrophils Relative %: 90 %

## 2019-06-27 LAB — COMPREHENSIVE METABOLIC PANEL
ALT: 26 U/L (ref 0–44)
AST: 31 U/L (ref 15–41)
Albumin: 4.2 g/dL (ref 3.5–5.0)
Alkaline Phosphatase: 125 U/L (ref 38–126)
Anion gap: 14 (ref 5–15)
BUN: 7 mg/dL (ref 6–20)
CO2: 22 mmol/L (ref 22–32)
Calcium: 9 mg/dL (ref 8.9–10.3)
Chloride: 101 mmol/L (ref 98–111)
Creatinine, Ser: 0.98 mg/dL (ref 0.44–1.00)
GFR calc Af Amer: >60 mL/min (ref >60)
GFR calc non Af Amer: >60 mL/min (ref >60)
Glucose, Bld: 176 mg/dL — ABNORMAL HIGH (ref 70–99)
Potassium: 3.4 mmol/L — ABNORMAL LOW (ref 3.5–5.1)
Sodium: 137 mmol/L (ref 135–145)
Total Bilirubin: 1.1 mg/dL (ref 0.3–1.2)
Total Protein: 7.5 g/dL (ref 6.5–8.1)

## 2019-06-27 LAB — CBC
HCT: 37.6 % (ref 36.0–46.0)
Hemoglobin: 13.3 g/dL (ref 12.0–15.0)
MCH: 29.6 pg (ref 26.0–34.0)
MCHC: 35.4 g/dL (ref 30.0–36.0)
MCV: 83.6 fL (ref 80.0–100.0)
Platelets: 291 10*3/uL (ref 150–400)
RBC: 4.5 MIL/uL (ref 3.87–5.11)
RDW: 12.1 % (ref 11.5–15.5)
WBC: 25.6 10*3/uL — ABNORMAL HIGH (ref 4.0–10.5)
nRBC: 0 % (ref 0.0–0.2)

## 2019-06-27 LAB — LACTIC ACID, PLASMA
Lactic Acid, Venous: 3.6 mmol/L (ref 0.5–1.9)
Lactic Acid, Venous: 2.3 mmol/L (ref 0.5–1.9)
Lactic Acid, Venous: 1.3 mmol/L (ref 0.5–1.9)
Lactic Acid, Venous: 1.2 mmol/L (ref 0.5–1.9)

## 2019-06-27 LAB — SARS CORONAVIRUS 2 BY RT PCR (HOSPITAL ORDER, PERFORMED IN ~~LOC~~ HOSPITAL LAB): SARS Coronavirus 2: NEGATIVE

## 2019-06-27 LAB — BLOOD CULTURE ID PANEL (REFLEXED)

## 2019-06-27 LAB — URINALYSIS, COMPLETE (UACMP) WITH MICROSCOPIC
Bilirubin Urine: NEGATIVE
Glucose, UA: NEGATIVE mg/dL
Hgb urine dipstick: NEGATIVE
Ketones, ur: 20 mg/dL — AB
Nitrite: NEGATIVE
Protein, ur: 100 mg/dL — AB
Specific Gravity, Urine: 1.013 (ref 1.005–1.030)
WBC, UA: >50 WBC/hpf — ABNORMAL HIGH (ref 0–5)
pH: 9 — ABNORMAL HIGH (ref 5.0–8.0)

## 2019-06-27 LAB — PROTIME-INR
INR: 1.2 (ref 0.8–1.2)
Prothrombin Time: 14.4 s (ref 11.4–15.2)

## 2019-06-27 LAB — PROCALCITONIN
Procalcitonin: 4.13 ng/mL
Procalcitonin: 15.82 ng/mL

## 2019-06-27 LAB — LIPASE, BLOOD: Lipase: 18 U/L (ref 11–51)

## 2019-06-27 LAB — APTT: aPTT: 38 seconds — ABNORMAL HIGH (ref 24–36)

## 2019-06-27 SURGERY — CYSTOSCOPY, WITH STENT INSERTION
Anesthesia: General | Site: Ureter | Laterality: Right

## 2019-06-27 MED ORDER — FENTANYL CITRATE (PF) 100 MCG/2ML IJ SOLN
25.0000 ug | INTRAMUSCULAR | Status: DC | PRN
  Administered 2019-06-27 – 2019-06-28 (×3): 25 ug via INTRAVENOUS
  Filled 2019-06-27 (×3): qty 2

## 2019-06-27 MED ORDER — LIDOCAINE HCL (PF) 2 % IJ SOLN
INTRAMUSCULAR | Status: AC
  Filled 2019-06-27: qty 5

## 2019-06-27 MED ORDER — ONDANSETRON HCL 4 MG/2ML IJ SOLN
INTRAMUSCULAR | Status: DC | PRN
  Administered 2019-06-27: 4 mg via INTRAVENOUS

## 2019-06-27 MED ORDER — OXYCODONE HCL 5 MG PO TABS: 5.0000 mg | ORAL_TABLET | Freq: Once | ORAL | Status: DC | PRN

## 2019-06-27 MED ORDER — MONTELUKAST SODIUM 10 MG PO TABS
10.0000 mg | ORAL_TABLET | Freq: Every day | ORAL | Status: DC
  Administered 2019-06-27 – 2019-06-29 (×3): 10 mg via ORAL
  Filled 2019-06-27 (×3): qty 1

## 2019-06-27 MED ORDER — MIDAZOLAM HCL 2 MG/2ML IJ SOLN
INTRAMUSCULAR | Status: AC
  Filled 2019-06-27: qty 2

## 2019-06-27 MED ORDER — PROPOFOL 10 MG/ML IV BOLUS
INTRAVENOUS | Status: AC
  Filled 2019-06-27: qty 40

## 2019-06-27 MED ORDER — ADULT MULTIVITAMIN W/MINERALS CH
1.0000 | ORAL_TABLET | Freq: Every day | ORAL | Status: DC
  Administered 2019-06-27 – 2019-06-29 (×3): 1 via ORAL
  Filled 2019-06-27 (×3): qty 1

## 2019-06-27 MED ORDER — ONDANSETRON HCL 4 MG/2ML IJ SOLN: 4.0000 mg | Freq: Once | INTRAMUSCULAR | Status: DC | PRN

## 2019-06-27 MED ORDER — FENTANYL CITRATE (PF) 100 MCG/2ML IJ SOLN
50.0000 ug | Freq: Once | INTRAMUSCULAR | Status: AC
  Administered 2019-06-27: 50 ug via INTRAVENOUS

## 2019-06-27 MED ORDER — METRONIDAZOLE IN NACL 5-0.79 MG/ML-% IV SOLN
500.0000 mg | Freq: Once | INTRAVENOUS | Status: AC
  Administered 2019-06-27: 500 mg via INTRAVENOUS
  Filled 2019-06-27: qty 100

## 2019-06-27 MED ORDER — ONDANSETRON HCL 4 MG/2ML IJ SOLN
4.0000 mg | Freq: Three times a day (TID) | INTRAMUSCULAR | Status: DC | PRN
  Administered 2019-06-27: 4 mg via INTRAVENOUS
  Filled 2019-06-27: qty 2

## 2019-06-27 MED ORDER — ACETAMINOPHEN 325 MG PO TABS: 650.0000 mg | ORAL_TABLET | Freq: Four times a day (QID) | ORAL | Status: DC | PRN

## 2019-06-27 MED ORDER — LORATADINE 10 MG PO TABS
10.0000 mg | ORAL_TABLET | Freq: Every evening | ORAL | Status: DC
  Administered 2019-06-27 – 2019-06-28 (×2): 10 mg via ORAL
  Filled 2019-06-27 (×2): qty 1

## 2019-06-27 MED ORDER — SUCCINYLCHOLINE CHLORIDE 200 MG/10ML IV SOSY
PREFILLED_SYRINGE | INTRAVENOUS | Status: AC
  Filled 2019-06-27: qty 20

## 2019-06-27 MED ORDER — METOPROLOL SUCCINATE ER 25 MG PO TB24
25.0000 mg | ORAL_TABLET | Freq: Every day | ORAL | Status: DC
  Administered 2019-06-28 – 2019-06-29 (×2): 25 mg via ORAL
  Filled 2019-06-27 (×3): qty 1

## 2019-06-27 MED ORDER — ALBUTEROL SULFATE (2.5 MG/3ML) 0.083% IN NEBU
3.0000 mL | INHALATION_SOLUTION | RESPIRATORY_TRACT | Status: DC | PRN
  Administered 2019-06-29: 3 mL via RESPIRATORY_TRACT
  Filled 2019-06-27: qty 3

## 2019-06-27 MED ORDER — SODIUM CHLORIDE 0.9 % IV BOLUS (SEPSIS)
1000.0000 mL | Freq: Once | INTRAVENOUS | Status: AC
  Administered 2019-06-27: 1000 mL via INTRAVENOUS

## 2019-06-27 MED ORDER — FENTANYL CITRATE (PF) 100 MCG/2ML IJ SOLN
INTRAMUSCULAR | Status: AC
  Filled 2019-06-27: qty 2

## 2019-06-27 MED ORDER — LAMOTRIGINE 100 MG PO TABS
200.0000 mg | ORAL_TABLET | Freq: Every evening | ORAL | Status: DC
  Administered 2019-06-27 – 2019-06-28 (×2): 200 mg via ORAL
  Filled 2019-06-27 (×2): qty 2

## 2019-06-27 MED ORDER — MIDAZOLAM HCL 2 MG/2ML IJ SOLN
INTRAMUSCULAR | Status: DC | PRN
  Administered 2019-06-27: 2 mg via INTRAVENOUS

## 2019-06-27 MED ORDER — HYDROMORPHONE HCL 1 MG/ML IJ SOLN
1.0000 mg | Freq: Once | INTRAMUSCULAR | Status: AC
  Administered 2019-06-27: 1 mg via INTRAVENOUS
  Filled 2019-06-27: qty 1

## 2019-06-27 MED ORDER — LORAZEPAM 2 MG/ML IJ SOLN: 1.0000 mg | INTRAMUSCULAR | Status: DC | PRN

## 2019-06-27 MED ORDER — SIMVASTATIN 20 MG PO TABS
10.0000 mg | ORAL_TABLET | Freq: Every day | ORAL | Status: DC
  Administered 2019-06-27 – 2019-06-29 (×3): 10 mg via ORAL
  Filled 2019-06-27 (×3): qty 1

## 2019-06-27 MED ORDER — PANTOPRAZOLE SODIUM 40 MG PO TBEC
40.0000 mg | DELAYED_RELEASE_TABLET | Freq: Every day | ORAL | Status: DC
  Administered 2019-06-27 – 2019-06-29 (×3): 40 mg via ORAL
  Filled 2019-06-27 (×3): qty 1

## 2019-06-27 MED ORDER — IPRATROPIUM BROMIDE 0.03 % NA SOLN
2.0000 | Freq: Two times a day (BID) | NASAL | Status: DC
  Administered 2019-06-27 – 2019-06-29 (×5): 2 via NASAL
  Filled 2019-06-27: qty 30

## 2019-06-27 MED ORDER — FENTANYL CITRATE (PF) 100 MCG/2ML IJ SOLN
INTRAMUSCULAR | Status: DC | PRN
  Administered 2019-06-27 (×2): 50 ug via INTRAVENOUS

## 2019-06-27 MED ORDER — AMITRIPTYLINE HCL 25 MG PO TABS
25.0000 mg | ORAL_TABLET | Freq: Every day | ORAL | Status: DC
  Administered 2019-06-27 – 2019-06-28 (×2): 25 mg via ORAL
  Filled 2019-06-27 (×2): qty 1

## 2019-06-27 MED ORDER — LAMOTRIGINE 100 MG PO TABS
100.0000 mg | ORAL_TABLET | Freq: Every morning | ORAL | Status: DC
  Administered 2019-06-28 – 2019-06-29 (×2): 100 mg via ORAL
  Filled 2019-06-27 (×3): qty 1

## 2019-06-27 MED ORDER — HYDROMORPHONE HCL 1 MG/ML IJ SOLN
1.0000 mg | INTRAMUSCULAR | Status: DC | PRN
  Administered 2019-06-27: 1 mg via INTRAVENOUS
  Filled 2019-06-27: qty 1

## 2019-06-27 MED ORDER — OXYCODONE HCL 5 MG/5ML PO SOLN: 5.0000 mg | Freq: Once | ORAL | Status: DC | PRN

## 2019-06-27 MED ORDER — IOHEXOL 9 MG/ML PO SOLN
500.0000 mL | Freq: Once | ORAL | Status: DC | PRN
  Administered 2019-06-27: 500 mL via ORAL

## 2019-06-27 MED ORDER — SUGAMMADEX SODIUM 500 MG/5ML IV SOLN
INTRAVENOUS | Status: AC
  Filled 2019-06-27: qty 5

## 2019-06-27 MED ORDER — SODIUM CHLORIDE 0.9 % IV SOLN
2.0000 g | INTRAVENOUS | Status: DC
  Administered 2019-06-27: 2 g via INTRAVENOUS
  Filled 2019-06-27: qty 2

## 2019-06-27 MED ORDER — IOPAMIDOL (ISOVUE-300) INJECTION 61%
INTRAVENOUS | Status: DC | PRN
  Administered 2019-06-27: 15 mL

## 2019-06-27 MED ORDER — DEXAMETHASONE SODIUM PHOSPHATE 10 MG/ML IJ SOLN
INTRAMUSCULAR | Status: DC | PRN
  Administered 2019-06-27: 10 mg via INTRAVENOUS

## 2019-06-27 MED ORDER — SUCCINYLCHOLINE CHLORIDE 20 MG/ML IJ SOLN
INTRAMUSCULAR | Status: DC | PRN
Start: 2019-06-27 — End: 2019-06-27
  Administered 2019-06-27: 100 mg via INTRAVENOUS

## 2019-06-27 MED ORDER — GABAPENTIN 300 MG PO CAPS
600.0000 mg | ORAL_CAPSULE | Freq: Two times a day (BID) | ORAL | Status: DC
  Administered 2019-06-29: 600 mg via ORAL
  Filled 2019-06-27: qty 2

## 2019-06-27 MED ORDER — MOMETASONE FURO-FORMOTEROL FUM 100-5 MCG/ACT IN AERO
2.0000 | INHALATION_SPRAY | Freq: Two times a day (BID) | RESPIRATORY_TRACT | Status: DC
  Administered 2019-06-27 – 2019-06-29 (×4): 2 via RESPIRATORY_TRACT
  Filled 2019-06-27: qty 8.8

## 2019-06-27 MED ORDER — OLOPATADINE HCL 0.1 % OP SOLN
1.0000 [drp] | Freq: Two times a day (BID) | OPHTHALMIC | Status: DC | PRN
  Filled 2019-06-27: qty 5

## 2019-06-27 MED ORDER — LEVORPHANOL TARTRATE 2 MG PO TABS
2.0000 mg | ORAL_TABLET | Freq: Three times a day (TID) | ORAL | Status: DC
  Administered 2019-06-28 – 2019-06-29 (×3): 2 mg via ORAL
  Filled 2019-06-27 (×7): qty 1

## 2019-06-27 MED ORDER — LACTATED RINGERS IV SOLN: INTRAVENOUS | Status: DC | PRN

## 2019-06-27 MED ORDER — VANCOMYCIN HCL IN DEXTROSE 1-5 GM/200ML-% IV SOLN
1000.0000 mg | Freq: Once | INTRAVENOUS | Status: DC
  Filled 2019-06-27: qty 200

## 2019-06-27 MED ORDER — ONDANSETRON HCL 4 MG/2ML IJ SOLN
4.0000 mg | Freq: Once | INTRAMUSCULAR | Status: AC | PRN
  Administered 2019-06-27: 4 mg via INTRAVENOUS
  Filled 2019-06-27: qty 2

## 2019-06-27 MED ORDER — HYDROXYZINE HCL 25 MG PO TABS
25.0000 mg | ORAL_TABLET | Freq: Three times a day (TID) | ORAL | Status: DC
  Administered 2019-06-27 – 2019-06-29 (×6): 25 mg via ORAL
  Filled 2019-06-27 (×6): qty 1

## 2019-06-27 MED ORDER — FENTANYL CITRATE (PF) 100 MCG/2ML IJ SOLN
50.0000 ug | Freq: Once | INTRAMUSCULAR | Status: AC
  Administered 2019-06-27: 50 ug via INTRAVENOUS
  Filled 2019-06-27: qty 2

## 2019-06-27 MED ORDER — PROPOFOL 10 MG/ML IV BOLUS
INTRAVENOUS | Status: DC | PRN
  Administered 2019-06-27: 50 mg via INTRAVENOUS
  Administered 2019-06-27: 150 mg via INTRAVENOUS

## 2019-06-27 MED ORDER — LIDOCAINE HCL (PF) 2 % IJ SOLN
INTRAMUSCULAR | Status: AC
  Filled 2019-06-27: qty 10

## 2019-06-27 MED ORDER — KETAMINE HCL 10 MG/ML IJ SOLN
INTRAMUSCULAR | Status: DC | PRN
  Administered 2019-06-27: 50 mg via INTRAVENOUS
  Administered 2019-06-27: 40 mg via INTRAVENOUS

## 2019-06-27 MED ORDER — CHLORHEXIDINE GLUCONATE CLOTH 2 % EX PADS: 6.0000 | MEDICATED_PAD | Freq: Every day | CUTANEOUS | Status: DC

## 2019-06-27 MED ORDER — DM-GUAIFENESIN ER 30-600 MG PO TB12: 1.0000 | ORAL_TABLET | Freq: Two times a day (BID) | ORAL | Status: DC | PRN

## 2019-06-27 MED ORDER — FENTANYL CITRATE (PF) 100 MCG/2ML IJ SOLN: 25.0000 ug | INTRAMUSCULAR | Status: DC | PRN

## 2019-06-27 MED ORDER — SODIUM CHLORIDE 0.9 % IV SOLN
1.0000 g | Freq: Three times a day (TID) | INTRAVENOUS | Status: DC
  Administered 2019-06-27 – 2019-06-29 (×7): 1 g via INTRAVENOUS
  Filled 2019-06-27 (×10): qty 1

## 2019-06-27 MED ORDER — LIDOCAINE HCL (CARDIAC) PF 100 MG/5ML IV SOSY
PREFILLED_SYRINGE | INTRAVENOUS | Status: DC | PRN
  Administered 2019-06-27: 100 mg via INTRAVENOUS

## 2019-06-27 MED ORDER — ACETAMINOPHEN 10 MG/ML IV SOLN
INTRAVENOUS | Status: AC
  Filled 2019-06-27: qty 100

## 2019-06-27 MED ORDER — LEVOTHYROXINE SODIUM 137 MCG PO TABS
137.0000 ug | ORAL_TABLET | Freq: Every day | ORAL | Status: DC
  Administered 2019-06-28 – 2019-06-29 (×2): 137 ug via ORAL
  Filled 2019-06-27 (×3): qty 1

## 2019-06-27 MED ORDER — TIZANIDINE HCL 4 MG PO TABS
4.0000 mg | ORAL_TABLET | Freq: Three times a day (TID) | ORAL | Status: DC | PRN
  Administered 2019-06-28 – 2019-06-29 (×2): 4 mg via ORAL
  Filled 2019-06-27 (×3): qty 1

## 2019-06-27 MED ORDER — KETAMINE HCL 50 MG/ML IJ SOLN
INTRAMUSCULAR | Status: AC
  Filled 2019-06-27: qty 10

## 2019-06-27 MED ORDER — ONDANSETRON HCL 4 MG/2ML IJ SOLN
4.0000 mg | Freq: Once | INTRAMUSCULAR | Status: AC
  Administered 2019-06-27: 4 mg via INTRAVENOUS
  Filled 2019-06-27: qty 2

## 2019-06-27 MED ORDER — DICYCLOMINE HCL 20 MG PO TABS
20.0000 mg | ORAL_TABLET | Freq: Three times a day (TID) | ORAL | Status: DC
  Administered 2019-06-27 – 2019-06-29 (×6): 20 mg via ORAL
  Filled 2019-06-27 (×8): qty 1

## 2019-06-27 MED ORDER — LEVOCETIRIZINE DIHYDROCHLORIDE 5 MG PO TABS: 5.0000 mg | ORAL_TABLET | Freq: Every evening | ORAL | Status: DC

## 2019-06-27 MED ORDER — TIOTROPIUM BROMIDE MONOHYDRATE 18 MCG IN CAPS
18.0000 ug | ORAL_CAPSULE | Freq: Every day | RESPIRATORY_TRACT | Status: DC
  Administered 2019-06-27 – 2019-06-29 (×3): 18 ug via RESPIRATORY_TRACT
  Filled 2019-06-27: qty 5

## 2019-06-27 MED ORDER — IOHEXOL 300 MG/ML  SOLN
100.0000 mL | Freq: Once | INTRAMUSCULAR | Status: AC | PRN
  Administered 2019-06-27: 100 mL via INTRAVENOUS

## 2019-06-27 MED ORDER — QUETIAPINE FUMARATE 25 MG PO TABS
25.0000 mg | ORAL_TABLET | Freq: Every day | ORAL | Status: DC
  Administered 2019-06-27 – 2019-06-28 (×2): 25 mg via ORAL
  Filled 2019-06-27 (×2): qty 1

## 2019-06-27 MED ORDER — ASCORBIC ACID 500 MG PO TABS
1000.0000 mg | ORAL_TABLET | Freq: Every day | ORAL | Status: DC
  Administered 2019-06-27 – 2019-06-29 (×3): 1000 mg via ORAL
  Filled 2019-06-27 (×3): qty 2

## 2019-06-27 MED ORDER — FLUTICASONE PROPIONATE 50 MCG/ACT NA SUSP
2.0000 | Freq: Every day | NASAL | Status: DC
  Administered 2019-06-27 – 2019-06-29 (×3): 2 via NASAL
  Filled 2019-06-27: qty 16

## 2019-06-27 MED ORDER — KETOROLAC TROMETHAMINE 30 MG/ML IJ SOLN
INTRAMUSCULAR | Status: DC | PRN
  Administered 2019-06-27: 30 mg via INTRAVENOUS

## 2019-06-27 MED ORDER — GLYCOPYRROLATE 0.2 MG/ML IJ SOLN
INTRAMUSCULAR | Status: AC
  Filled 2019-06-27: qty 1

## 2019-06-27 MED ORDER — VANCOMYCIN HCL 1750 MG/350ML IV SOLN
1750.0000 mg | Freq: Once | INTRAVENOUS | Status: AC
  Administered 2019-06-27: 1750 mg via INTRAVENOUS
  Filled 2019-06-27: qty 350

## 2019-06-27 MED ORDER — ACETAMINOPHEN 10 MG/ML IV SOLN
INTRAVENOUS | Status: DC | PRN
  Administered 2019-06-27: 1000 mg via INTRAVENOUS

## 2019-06-27 MED ORDER — SODIUM CHLORIDE 0.9 % IV SOLN: INTRAVENOUS | Status: DC

## 2019-06-27 MED ORDER — SODIUM CHLORIDE 0.9 % IV SOLN
2.0000 g | Freq: Once | INTRAVENOUS | Status: AC
  Administered 2019-06-27: 2 g via INTRAVENOUS
  Filled 2019-06-27: qty 2

## 2019-06-27 SURGICAL SUPPLY — 18 items
BAG DRAIN CYSTO-URO LG1000N (MISCELLANEOUS) ×4 IMPLANT
BRUSH SCRUB EZ  4% CHG (MISCELLANEOUS) ×4
BRUSH SCRUB EZ 4% CHG (MISCELLANEOUS) ×2 IMPLANT
CATH FOL 2WAY LX 16X5 (CATHETERS) ×4 IMPLANT
CATH URETL 5X70 OPEN END (CATHETERS) ×4 IMPLANT
CONRAY 43 FOR UROLOGY 50M (MISCELLANEOUS) ×4 IMPLANT
DRAPE UTILITY 15X26 TOWEL STRL (DRAPES) ×4 IMPLANT
GOWN STRL REUS W/ TWL LRG LVL3 (GOWN DISPOSABLE) ×2 IMPLANT
GOWN STRL REUS W/TWL LRG LVL3 (GOWN DISPOSABLE) ×4
GUIDEWIRE STR DUAL SENSOR (WIRE) ×4 IMPLANT
KIT TURNOVER CYSTO (KITS) ×4 IMPLANT
PACK CYSTO AR (MISCELLANEOUS) ×4 IMPLANT
SET CYSTO W/LG BORE CLAMP LF (SET/KITS/TRAYS/PACK) ×4 IMPLANT
SOL .9 NS 3000ML IRR  AL (IV SOLUTION) ×4
SOL .9 NS 3000ML IRR UROMATIC (IV SOLUTION) ×2 IMPLANT
STENT URET 6FRX24 CONTOUR (STENTS) ×4 IMPLANT
SURGILUBE 2OZ TUBE FLIPTOP (MISCELLANEOUS) ×4 IMPLANT
WATER STERILE IRR 1000ML POUR (IV SOLUTION) ×4 IMPLANT

## 2019-06-27 NOTE — H&P (Signed)
History and Physical    Katie Neal KGU:[id number] DOB: 1970/09/24 DOA: 06/27/2019  Referring MD/NP/PA:   PCP: Rayetta Humphrey, MD   Patient coming from:  The patient is coming from home.  At baseline, pt is independent for most of ADL.        Chief Complaint: Abdominal pain  HPI: Katie Neal is a 49 y.o. female with medical history significant of hyperlipidemia, asthma, GERD, hypothyroidism, depression with anxiety, PTSD, IBS, CRPS (complex regional pain syndrome type I, involving internal organs including bladder), insomnia, seizure, who presents with abdominal pain.  Patient states that her abdominal pain started yesterday after not but 5 PM.  The pain is located in the right lower quadrant, constant, severe, sharp, radiating to the right flank area.  Associated with nausea, but no vomiting.  Patient states that she had diarrhea twice yesterday, which has resolved.  Currently no diarrhea.  Patient denies symptoms of UTI, but has fullness feeling in the suprapubic area.  She has chronic mild cough, mild shortness of breath, but no chest pain.  Patient denies subjective fever or chills, but her temperature is 100.9 in ED.  ED Course: pt was found to have WBC 25.6, lactic acid 3.6, 2.3, lipase 18, positive urinalysis (cloudy appearance, large amount of leukocyte, rare bacteria, WBC >50), negative Covid PCR, potassium 3.4, renal function okay, temperature 100.9, blood pressure 143/80, tachycardia, RR 14, oxygen saturation 97% on room air.  CT abdomen/pelvis that showed obstructive right 6 mm of ureteral stone.  Patient is admitted to progressive bed as inpatient.  Urology was consulted.  Patient underwent urgent procedure.  Review of Systems:   General: has fevers, no chills, no body weight gain, has poor appetite, has fatigue HEENT: no blurry vision, hearing changes or sore throat Respiratory: no dyspnea, coughing, wheezing CV: no chest pain, no palpitations GI: has nausea,  abdominal pain, no diarrhea, constipation, vomiting GU: no dysuria, burning on urination, increased urinary frequency, hematuria  Ext: no leg edema Neuro: no unilateral weakness, numbness, or tingling, no vision change or hearing loss Skin: no rash, no skin tear. MSK: No muscle spasm, no deformity, no limitation of range of movement in spin Heme: No easy bruising.  Travel history: No recent long distant travel.  Allergy:  Allergies  Allergen Reactions  . Codeine Itching    Can take with benadryl     Past Medical History:  Diagnosis Date  . Arthritis   . Asthma   . CRPS (complex regional pain syndrome type I)   . Depression   . IBS (irritable bowel syndrome)   . Insomnia   . PTSD (post-traumatic stress disorder)     Past Surgical History:  Procedure Laterality Date  . ABDOMINAL HYSTERECTOMY    . ACHILLES TENDON REPAIR  1982  . APPENDECTOMY    . FOOT SURGERY  2002   due to deformity  . HIP SURGERY  2012  . KNEE ARTHROSCOPY    . LAPAROSCOPIC HYSTERECTOMY  2000  . REPAIR TENDONS LEG     x2    Social History:  reports that she has quit smoking. Her smoking use included cigarettes. She has never used smokeless tobacco. She reports that she does not drink alcohol or use drugs.  Family History:  Family History  Problem Relation Age of Onset  . Diabetes Mellitus II Father      Prior to Admission medications   Medication Sig Start Date End Date Taking? Authorizing Provider  amitriptyline (ELAVIL) 25 MG  tablet Take 25 mg by mouth at bedtime.     [provider]  budesonide-formoterol (SYMBICORT) 80-4.5 MCG/ACT inhaler Inhale 2 puffs into the lungs 2 (two) times daily.    [provider]  dextromethorphan-guaiFENesin (MUCINEX DM) 30-600 MG 12hr tablet Take 1 tablet by mouth 2 (two) times daily. 01/01/19   Arnetha Courser, MD  dicyclomine (BENTYL) 20 MG tablet Take 20 mg by mouth 3 (three) times daily before meals.     [provider]  fluticasone  (FLONASE) 50 MCG/ACT nasal spray Place 2 sprays into both nostrils daily.    [provider]  Gabapentin Enacarbil (HORIZANT) 600 MG TBCR Take 600 mg by mouth 2 (two) times daily.    [provider]  hydrOXYzine (ATARAX/VISTARIL) 25 MG tablet Take 25 mg by mouth 3 (three) times daily as needed for anxiety.     [provider]  ipratropium (ATROVENT) 0.03 % nasal spray Place 2 sprays into both nostrils every 12 (twelve) hours.    [provider]  lamoTRIgine (LAMICTAL) 100 MG tablet Take 100-200 mg by mouth See admin instructions. Take 1 tablet (100mg ) by mouth every morning and take 2 tablets (200mg ) by mouth at bedtime    [provider]  levorphanol (LEVODROMORAN) 2 MG tablet Take 2 mg by mouth every 8 (eight) hours.     [provider]  levothyroxine (SYNTHROID, LEVOTHROID) 137 MCG tablet Take 137 mcg by mouth daily. 11/10/15   [provider]  metoprolol succinate (TOPROL-XL) 25 MG 24 hr tablet Take 25 mg by mouth daily.    [provider]  montelukast (SINGULAIR) 10 MG tablet Take 10 mg by mouth daily. 12/06/15   [provider]  Multiple Vitamin (MULTIVITAMIN WITH MINERALS) TABS tablet Take 1 tablet by mouth daily.    [provider]  Olopatadine HCl (PAZEO) 0.7 % SOLN Place 1 drop into both eyes 2 (two) times daily as needed (allergy symptoms).     [provider]  ondansetron (ZOFRAN-ODT) 4 MG disintegrating tablet Take 4 mg by mouth every 8 (eight) hours as needed for nausea. 01/13/15   [provider]  pantoprazole (PROTONIX) 40 MG tablet Take 40 mg by mouth daily.    [provider]  simvastatin (ZOCOR) 10 MG tablet Take 10 mg by mouth daily.     [provider]  tiotropium (SPIRIVA) 18 MCG inhalation capsule Place 18 mcg into inhaler and inhale daily.    [provider]  tiZANidine (ZANAFLEX) 4 MG tablet Take 4 mg by mouth every 8 (eight) hours as needed  for muscle spasms.    [provider]  traZODone (DESYREL) 100 MG tablet Take 2 tablets (200 mg total) by mouth at bedtime. Needs office visit 12/14/11   01/15/15, PA-C  venlafaxine XR (EFFEXOR-XR) 75 MG 24 hr capsule Take 3 capsules (225 mg total) by mouth daily. 06/14/11   Mayans, Nelva Nay, MD  vitamin C (ASCORBIC ACID) 500 MG tablet Take 1,000 mg by mouth daily.    [provider]    Physical Exam: Vitals:   06/27/19 1253 06/27/19 1254 06/27/19 1308 06/27/19 1407  BP: 102/64  104/61 101/65  Pulse: (!) 116  (!) 115 (!) 114  Resp: 15  15 20   Temp:  99.5 F (37.5 C)  98.5 F (36.9 C)  TempSrc:    Oral  SpO2: 95% 95% 96% 97%  Weight:      Height:       General: Not  in acute distress HEENT:       Eyes: PERRL, EOMI, no scleral icterus.       ENT: No discharge from the ears and nose, no pharynx injection, no tonsillar enlargement.        Neck: No JVD, no bruit, no mass felt. Heme: No neck lymph node enlargement. Cardiac: S1/S2, RRR, No murmurs, No gallops or rubs. Respiratory: No rales, wheezing, rhonchi or rubs. GI: Soft, nondistended, has tenderness in RLQ, no rebound pain, no organomegaly, BS present. GU: No hematuria Ext: No pitting leg edema bilaterally. 2+DP/PT pulse bilaterally. Musculoskeletal: No joint deformities, No joint redness or warmth, no limitation of ROM in spin. Skin: No rashes.  Neuro: Alert, oriented X3, cranial nerves II-XII grossly intact, moves all extremities normally.  Psych: Patient is not psychotic, no suicidal or hemocidal ideation.  Labs on Admission: I have personally reviewed following labs and imaging studies  CBC: Recent Labs  Lab 06/27/19 0209 06/27/19 0509  WBC 25.6*  --   NEUTROABS  --  23.9*  HGB 13.3  --   HCT 37.6  --   MCV 83.6  --   PLT 291  --    Basic Metabolic Panel: Recent Labs  Lab 06/27/19 0209  NA 137  K 3.4*  CL 101  CO2 22  GLUCOSE 176*  BUN 7  CREATININE 0.98  CALCIUM 9.0    GFR: Estimated Creatinine Clearance: 77.1 mL/min (by C-G formula based on SCr of 0.98 mg/dL). Liver Function Tests: Recent Labs  Lab 06/27/19 0209  AST 31  ALT 26  ALKPHOS 125  BILITOT 1.1  PROT 7.5  ALBUMIN 4.2   Recent Labs  Lab 06/27/19 0509  LIPASE 18   No results for input(s): AMMONIA in the last 168 hours. Coagulation Profile: Recent Labs  Lab 06/27/19 1354  INR 1.2   Cardiac Enzymes: No results for input(s): CKTOTAL, CKMB, CKMBINDEX, TROPONINI in the last 168 hours. BNP (last 3 results) No results for input(s): PROBNP in the last 8760 hours. HbA1C: No results for input(s): HGBA1C in the last 72 hours. CBG: No results for input(s): GLUCAP in the last 168 hours. Lipid Profile: No results for input(s): CHOL, HDL, LDLCALC, TRIG, CHOLHDL, LDLDIRECT in the last 72 hours. Thyroid Function Tests: No results for input(s): TSH, T4TOTAL, FREET4, T3FREE, THYROIDAB in the last 72 hours. Anemia Panel: No results for input(s): VITAMINB12, FOLATE, FERRITIN, TIBC, IRON, RETICCTPCT in the last 72 hours. Urine analysis:    Component Value Date/Time   COLORURINE YELLOW (A) 06/27/2019 0509   APPEARANCEUR CLOUDY (A) 06/27/2019 0509   LABSPEC 1.013 06/27/2019 0509   PHURINE 9.0 (H) 06/27/2019 0509   GLUCOSEU NEGATIVE 06/27/2019 0509   HGBUR NEGATIVE 06/27/2019 0509   BILIRUBINUR NEGATIVE 06/27/2019 0509   KETONESUR 20 (A) 06/27/2019 0509   PROTEINUR 100 (A) 06/27/2019 0509   UROBILINOGEN 0.2 03/02/2009 1415   NITRITE NEGATIVE 06/27/2019 0509   LEUKOCYTESUR LARGE (A) 06/27/2019 0509   Sepsis Labs: @LABRCNTIP (procalcitonin:4,lacticidven:4) ) Recent Results (from the past 240 hour(s))  Culture, blood (routine x 2)     Status: None (Preliminary result)   Collection Time: 06/27/19  5:09 AM   Specimen: BLOOD  Result Value Ref Range Status   Specimen Description BLOOD BLOOD LEFT FOREARM  Final   Special Requests   Final    BOTTLES DRAWN AEROBIC AND ANAEROBIC Blood  Culture adequate volume   Culture  Setup Time   Final    GRAM NEGATIVE RODS IN BOTH AEROBIC AND ANAEROBIC  BOTTLES CRITICAL RESULT CALLED TO, READ BACK BY AND VERIFIED WITH: JASON ROBBINS AT 1629 06/27/19.PMF Performed at [facility], [address] [place], [place] [address]    Culture GRAM NEGATIVE RODS  Final   Report Status PENDING  Incomplete  SARS Coronavirus 2 by RT PCR (hospital order, performed in [facility] hospital lab) Nasopharyngeal Nasopharyngeal Swab     Status: None   Collection Time: 06/27/19  5:09 AM   Specimen: Nasopharyngeal Swab  Result Value Ref Range Status   SARS Coronavirus 2 NEGATIVE NEGATIVE Final    Comment: (NOTE) SARS-CoV-2 target nucleic acids are NOT DETECTED. The SARS-CoV-2 RNA is generally detectable in upper and lower respiratory specimens during the acute phase of infection. The lowest concentration of SARS-CoV-2 viral copies this assay can detect is 250 copies / mL. A negative result does not preclude SARS-CoV-2 infection and should not be used as the sole basis for treatment or other patient management decisions.  A negative result may occur with improper specimen collection / handling, submission of specimen other than nasopharyngeal swab, presence of viral mutation(s) within the areas targeted by this assay, and inadequate number of viral copies (<250 copies / mL). A negative result must be combined with clinical observations, patient history, and epidemiological information. Fact Sheet for Patients:   BoilerBrush.com.cyhttps://www.fda.gov/media/136312/download Fact Sheet for Healthcare Providers: https://pope.com/https://www.fda.gov/media/136313/download This test is not yet approved or cleared  by the [place] FDA and has been authorized for detection and/or diagnosis of SARS-CoV-2 by FDA under an Emergency Use Authorization (EUA).  This EUA will remain in effect (meaning this test can be used) for the duration of the COVID-19 declaration under Section  564(b)(1) of the Act, 21 U.S.C. section 360bbb-3(b)(1), unless the authorization is terminated or revoked sooner. Performed at [facility], [address] [place], [place] [address]   Culture, blood (routine x 2)     Status: None (Preliminary result)   Collection Time: 06/27/19  6:25 AM   Specimen: BLOOD  Result Value Ref Range Status   Specimen Description BLOOD BLOOD LEFT HAND  Final   Special Requests   Final    BOTTLES DRAWN AEROBIC AND ANAEROBIC Blood Culture adequate volume   Culture  Setup Time   Final    Organism ID to follow GRAM NEGATIVE RODS IN BOTH AEROBIC AND ANAEROBIC BOTTLES CRITICAL RESULT CALLED TO, READ BACK BY AND VERIFIED WITH: JASON ROBBINS AT 1629 06/27/19.PMF Performed at [facility], [address] [place], [place] [address]    Culture GRAM NEGATIVE RODS  Final   Report Status PENDING  Incomplete  Blood Culture ID Panel (Reflexed)     Status: Abnormal   Collection Time: 06/27/19  6:25 AM  Result Value Ref Range Status   Enterococcus species NOT DETECTED NOT DETECTED Final   Listeria monocytogenes NOT DETECTED NOT DETECTED Final   Staphylococcus species NOT DETECTED NOT DETECTED Final   Staphylococcus aureus (BCID) NOT DETECTED NOT DETECTED Final   Streptococcus species NOT DETECTED NOT DETECTED Final   Streptococcus agalactiae NOT DETECTED NOT DETECTED Final   Streptococcus pneumoniae NOT DETECTED NOT DETECTED Final   Streptococcus pyogenes NOT DETECTED NOT DETECTED Final   Acinetobacter baumannii NOT DETECTED NOT DETECTED Final   Enterobacteriaceae species DETECTED (A) NOT DETECTED Final    Comment: Enterobacteriaceae represent a large family of gram-negative bacteria, not a single organism. CRITICAL RESULT CALLED TO, READ BACK BY AND VERIFIED WITH: JASON ROBBINS AT 1629 06/27/19.PMF    Enterobacter cloacae complex NOT  DETECTED NOT DETECTED Final   Escherichia coli DETECTED (A) NOT DETECTED Final    Comment: CRITICAL RESULT CALLED  TO, READ BACK BY AND VERIFIED WITH: JASON ROBBINS AT 1629 06/27/19.PMF    Klebsiella oxytoca NOT DETECTED NOT DETECTED Final   Klebsiella pneumoniae NOT DETECTED NOT DETECTED Final   Proteus species NOT DETECTED NOT DETECTED Final   Serratia marcescens NOT DETECTED NOT DETECTED Final   Carbapenem resistance NOT DETECTED NOT DETECTED Final   Haemophilus influenzae NOT DETECTED NOT DETECTED Final   Neisseria meningitidis NOT DETECTED NOT DETECTED Final   Pseudomonas aeruginosa NOT DETECTED NOT DETECTED Final   Candida albicans NOT DETECTED NOT DETECTED Final   Candida glabrata NOT DETECTED NOT DETECTED Final   Candida krusei NOT DETECTED NOT DETECTED Final   Candida parapsilosis NOT DETECTED NOT DETECTED Final   Candida tropicalis NOT DETECTED NOT DETECTED Final    Comment: Performed at [facility], [address] [place], [name] [address]     Radiological Exams on Admission: CT Abdomen Pelvis W Contrast  Result Date: 06/27/2019 CLINICAL DATA:  49 year old female presenting with right lower quadrant abdominal pain. Patient was febrile on arrival with tachycardia. WBC 25.6, lactic acid 3.6. ED code sepsis initiated. EXAM: CT ABDOMEN AND PELVIS WITH CONTRAST TECHNIQUE: Multidetector CT imaging of the abdomen and pelvis was performed using the standard protocol following bolus administration of intravenous contrast. CONTRAST:  OMNIPAQUE IOHEXOL 300 MG/ML  SOLN COMPARISON:  10/04/2008 FINDINGS: Lower chest: Right lower lobe dependent opacity consistent with atelectasis. Left lung base is clear. Hepatobiliary: Liver normal in size. Diffuse decreased liver attenuation consistent with fatty infiltration. No liver mass or focal lesion. Normal gallbladder. No bile duct dilation. Pancreas: Unremarkable. No pancreatic ductal dilatation or surrounding inflammatory changes. Spleen: Normal in size without focal abnormality. Adrenals/Urinary Tract: No adrenal masses. There is moderate right  hydronephrosis, mild relative decrease in right renal enhancement and right perinephric stranding. The right ureter is dilated into the pelvis to the level of a 6 mm distal ureteral stone. No other ureteral stones. No renal masses. No intrarenal stones. No left intrarenal collecting system dilation. Normal left ureter. Bladder is unremarkable. Stomach/Bowel: Moderate distention of the stomach. Stomach otherwise unremarkable. Small bowel and colon are normal in caliber. No wall thickening. No inflammation. Previous appendectomy. Vascular/Lymphatic: Minor aortic atherosclerosis. No aneurysm. No other vascular abnormality. No enlarged lymph nodes. Reproductive: Status post hysterectomy. No adnexal masses. Other: No abdominal wall hernia or abnormality. No abdominopelvic ascites. Musculoskeletal: No acute or significant osseous findings. IMPRESSION: 1. 6 mm distal right ureteral stone causes moderate right obstructive uropathy. 2. No other acute finding.  No intrarenal stones. 3. Diffuse hepatic steatosis. 4. Status post hysterectomy and appendectomy. 5. Minor aortic atherosclerosis. Electronically Signed   By: Amie Portland M.D.   On: 06/27/2019 09:13   DG OR UROLOGY CYSTO IMAGE ([facility] ONLY)  Result Date: 06/27/2019 There is no interpretation for this exam.  This order is for images obtained during a surgical procedure.  Please See "Surgeries" Tab for more information regarding the procedure.     EKG: Not done in ED  Assessment/Plan Principal Problem:   Right ureteral stone Active Problems:   CRPS (complex regional pain syndrome type I)   Asthma   Depression with anxiety   HLD (hyperlipidemia)   Hypothyroidism   Seizure (HCC)   Sepsis (HCC)   Acute pyelonephritis   Sepsis due to acute pyelonephritis 2/2 to right ureteral stone: Patient meets criteria for sepsis with  leukocytosis, fever, tachycardia.  Lactic acid 3.6, 2.3. Dr. Alvester Morin of urology is consulted --> urgent cystoscopy with right retrograde  pyelogram and right ureteral stent placement was done. Highly appreciated Dr. Shannan Harper help. Per pharmacist, Lab calle with BCID on this patient, she grew out E. coli in 4 out of 4 bottles GPC was not detected. Unable to specify whether or not this organism is ESBL.  Patient initially received 1 dose of vancomycin, Flagyl and cefepime in ED, then switched to Rocephin.  Now will change to imipenem.  -will admit to progressive bed as inpatient -Meropenem IV -Blood culture urine culture -prn fentanyl for pain -will get Procalcitonin and trend lactic acid levels per sepsis protocol. -IVF: 3L of NS bolus in ED, followed by 75 cc/h   CRPS (complex regional pain syndrome type I): -on prn fentanyl as above  Asthma: stable -Bronchodilators  Depression and anxiety: Stable, no suicidal or homicidal ideations. -Continue home medications   HLD (hyperlipidemia) -Zocor  Hypothyroidism -Synthroid  Seizure -Seizure precaution -When necessary Ativan for seizure -Continue Home medications: Lamictal    DVT ppx: SCD Code Status: Full code Family Communication: not done, no family member is at bed side.    Disposition Plan:  Anticipate discharge back to previous environment Consults called:  Urology, Dr. Alvester Morin Admission status:  progressive unit as inpt        Status is: Inpatient  Remains inpatient appropriate because:Inpatient level of care appropriate due to severity of illness.  Patient has multiple comorbidities, now presents with sepsis due to acute pyelonephritis 2/2 to right ureteral stone. Pt had urgent procedure with ureteral stent placement.  Her presentation is highly complicated.  After procedure she developed hypotension.  Patient is at high risk for deteriorating.  Will need to be treated in hospital for at least 2 days   Dispo: The patient is from: Home              Anticipated d/c is to: Home              Anticipated d/c date is: 2 days              Patient currently is not  medically stable to d/c.          Date of Service 06/27/2019    Lorretta Harp [facility]   If 7PM-7AM, please contact night-coverage [email or web address] 06/27/2019, 5:02 PM

## 2019-06-27 NOTE — Progress Notes (Signed)
PHARMACY - PHYSICIAN COMMUNICATION CRITICAL VALUE ALERT - BLOOD CULTURE IDENTIFICATION (BCID)  Katie Neal is an 49 y.o. female who presented to [facility] on 06/27/2019 with a chief complaint of kidney stone   Assessment:  E Coli in 4 of 4 bottles  (include suspected source if known)  Name of physician (or Provider) Contacted: Niu   Current antibiotics: Ceftriaxone 2 gm IV Q24H   Changes to prescribed antibiotics recommended:  Will d/c ceftriaxone and start Meropenem 1 gm IV Q8H.   Results for orders placed or performed during the hospital encounter of 06/27/19  Blood Culture ID Panel (Reflexed) (Collected: 06/27/2019  6:25 AM)  Result Value Ref Range   Enterococcus species NOT DETECTED NOT DETECTED   Listeria monocytogenes NOT DETECTED NOT DETECTED   Staphylococcus species NOT DETECTED NOT DETECTED   Staphylococcus aureus (BCID) NOT DETECTED NOT DETECTED   Streptococcus species NOT DETECTED NOT DETECTED   Streptococcus agalactiae NOT DETECTED NOT DETECTED   Streptococcus pneumoniae NOT DETECTED NOT DETECTED   Streptococcus pyogenes NOT DETECTED NOT DETECTED   Acinetobacter baumannii NOT DETECTED NOT DETECTED   Enterobacteriaceae species DETECTED (A) NOT DETECTED   Enterobacter cloacae complex NOT DETECTED NOT DETECTED   Escherichia coli DETECTED (A) NOT DETECTED   Klebsiella oxytoca NOT DETECTED NOT DETECTED   Klebsiella pneumoniae NOT DETECTED NOT DETECTED   Proteus species NOT DETECTED NOT DETECTED   Serratia marcescens NOT DETECTED NOT DETECTED   Carbapenem resistance NOT DETECTED NOT DETECTED   Haemophilus influenzae NOT DETECTED NOT DETECTED   Neisseria meningitidis NOT DETECTED NOT DETECTED   Pseudomonas aeruginosa NOT DETECTED NOT DETECTED   Candida albicans NOT DETECTED NOT DETECTED   Candida glabrata NOT DETECTED NOT DETECTED   Candida krusei NOT DETECTED NOT DETECTED   Candida parapsilosis NOT DETECTED NOT DETECTED   Candida tropicalis NOT DETECTED NOT DETECTED     Tuan Tippin D 06/27/2019  4:55 PM

## 2019-06-27 NOTE — ED Provider Notes (Signed)
-----------------------------------------   10:50 AM on 06/27/2019 -----------------------------------------  I took over care of this patient from Dr. Dolores Frame.  The patient was pending a CT to further evaluate the cause of her abdominal pain.  CT shows a 6 mm distal right ureter stone with moderate obstruction.  I consulted Dr. Alvester Morin from urology who advises that he will take the patient to the OR for a stent.  The patient will still need admission due to her urinary tract infection and sepsis.  I discussed the case with Dr. Clyde Lundborg from the hospitalist service for admission.  The patient continues to report relatively severe pain and appears somewhat anxious, however she did not request any additional pain medication at this time.   Dionne Bucy, MD 06/27/19 1051

## 2019-06-27 NOTE — Op Note (Addendum)
Operative Note  Preoperative diagnosis:  1.  Right ureteral calculus with sepsis secondary to UTI  Post operative diagnosis: 1.  Right ureteral calculus with sepsis secondary to UTI  Procedure(s): 1.  Cystoscopy with right retrograde pyelogram and right ureteral stent placement  Surgeon: Modena Slater, MD  Assistants: None  Anesthesia: General  Complications: None immediate  EBL: Minimal  Specimens: 1.  None  Drains/Catheters: 1.  6 X 24 double-J ureteral stent 2.  Foley catheter  Intraoperative findings: 1.  Normal urethra and bladder except for some erythema consistent with diffuse cystitis 2.  Right retrograde pyelogram revealed a filling defect at the level of the stone with a dilated distal ureter.  Contrast would not extend past the level of obstruction  Indication: 49 year old female presented with a 2-week history of nausea, vomiting, right flank pain.  She presented with fever and leukocytosis.  CT scan showed a 6 mm distal right ureteral calculus.  She presents for emergent ureteral stent placement.  Description of procedure:  The patient was identified and consent was obtained.  The patient was taken to the operating room and placed in the supine position.  The patient was placed under general anesthesia.  Perioperative antibiotics were administered.  The patient was placed in dorsal lithotomy.  Patient was prepped and draped in a standard sterile fashion and a timeout was performed.  A 21 French rigid cystoscope was advanced into the urethra and into the bladder.  The right distal most portion of the ureter was cannulated with an open-ended ureteral catheter.  Retrograde pyelogram was performed with the findings noted above.  A sensor wire was then advanced up to the kidney under fluoroscopic guidance.  A 6 X 24 double-J ureteral stent was advanced up to the kidney under fluoroscopic guidance.  The wire was withdrawn and fluoroscopy confirmed good proximal placement and  direct visualization confirmed a good coil within the bladder.  The scope was withdrawn.  I placed a Foley catheter.  This concluded the operation.  Patient tolerated procedure well and was stable postoperatively.  Plan: Continue IV antibiotics.  Hopefully her pressure will improve in the PACU.  If not, may need advanced level of care.  She will need definitive management of the stone in a couple of weeks or so.

## 2019-06-27 NOTE — ED Notes (Signed)
ONE SET OF BLOOD CULTURES SENT TO LAB 

## 2019-06-27 NOTE — Transfer of Care (Signed)
Immediate Anesthesia Transfer of Care Note  Patient: Katie Neal  Procedure(s) Performed: CYSTOSCOPY WITH STENT PLACEMENT (Right Ureter)  Patient Location: PACU  Anesthesia Type:General  Level of Consciousness: sedated and responds to stimulation  Airway & Oxygen Therapy: Patient Spontanous Breathing and Patient connected to face mask oxygen  Post-op Assessment: Report given to RN and Post -op Vital signs reviewed and stable  Post vital signs: Reviewed and stable  Last Vitals:  Vitals Value Taken Time  BP 90/61 06/27/19 1211  Temp 37.5 C 06/27/19 1208  Pulse 121 06/27/19 1212  Resp 23 06/27/19 1212  SpO2 95 % 06/27/19 1212  Vitals shown include unvalidated device data.  Last Pain:  Vitals:   06/27/19 1054  TempSrc:   PainSc: 10-Worst pain ever         Complications: No apparent anesthesia complications

## 2019-06-27 NOTE — ED Notes (Addendum)
Pt has taken her first sip of contrast; reports unable "to drink that because it tastes like water"  Pt reports no change with pain meds, but did ask for the curtain to be pulled back to see the TV and for the lights to be dimmed

## 2019-06-27 NOTE — ED Triage Notes (Signed)
Pt with rlq pain that began at 1700. Pt with complex regional pain syndrome history. Pt with nausea, last bm yesterday.

## 2019-06-27 NOTE — ED Provider Notes (Addendum)
[facility] Emergency Department Provider Note   ____________________________________________   First MD Initiated Contact with Patient 06/27/19 [phone]     (approximate)  I have reviewed the triage vital signs and the nursing notes.   HISTORY  Chief Complaint Abdominal Pain    HPI Katie Neal is a 49 y.o. female who presents to the ED from home with a chief complaint of right lower quadrant abdominal pain.  Patient reports onset of pain around 5 PM, radiating to her flank.  Symptoms associated with nausea.  Denies fever, chills, cough, chest pain, vomiting, dysuria, diarrhea.      Past Medical History:  Diagnosis Date  . Arthritis   . Asthma   . CRPS (complex regional pain syndrome type I)   . Depression   . IBS (irritable bowel syndrome)   . Insomnia   . PTSD (post-traumatic stress disorder)     Patient Active Problem List   Diagnosis Date Noted  . Weakness   . Seizure ([place]) 12/30/2018  . CRPS (complex regional pain syndrome type I)   . Asthma   . Depression with anxiety   . HLD (hyperlipidemia)   . Lower urinary tract infectious disease   . Fall   . Hypothyroidism   . CAP (community acquired pneumonia)     Past Surgical History:  Procedure Laterality Date  . ABDOMINAL HYSTERECTOMY    . [place]  . APPENDECTOMY    . FOOT SURGERY  2002   due to deformity  . HIP SURGERY  2012  . KNEE ARTHROSCOPY    . LAPAROSCOPIC HYSTERECTOMY  2000  . REPAIR TENDONS LEG     x2    Prior to Admission medications   Medication Sig Start Date End Date Taking? Authorizing Provider  amitriptyline (ELAVIL) 25 MG tablet Take 25 mg by mouth at bedtime.     [provider]  budesonide-formoterol (SYMBICORT) 80-4.5 MCG/ACT inhaler Inhale 2 puffs into the lungs 2 (two) times daily.    [provider]  dextromethorphan-guaiFENesin (MUCINEX DM) 30-600 MG 12hr tablet Take 1 tablet by mouth 2 (two) times daily.  01/01/19   Lorella Nimrod, MD  dicyclomine (BENTYL) 20 MG tablet Take 20 mg by mouth 3 (three) times daily before meals.     [provider]  fluticasone (FLONASE) 50 MCG/ACT nasal spray Place 2 sprays into both nostrils daily.    [provider]  Gabapentin Enacarbil (HORIZANT) 600 MG TBCR Take 600 mg by mouth 2 (two) times daily.    [provider]  hydrOXYzine (ATARAX/VISTARIL) 25 MG tablet Take 25 mg by mouth 3 (three) times daily as needed for anxiety.     [provider]  ipratropium (ATROVENT) 0.03 % nasal spray Place 2 sprays into both nostrils every 12 (twelve) hours.    [provider]  lamoTRIgine (LAMICTAL) 100 MG tablet Take 100-200 mg by mouth See admin instructions. Take 1 tablet (100mg ) by mouth every morning and take 2 tablets (200mg ) by mouth at bedtime    [provider]  levorphanol (LEVODROMORAN) 2 MG tablet Take 2 mg by mouth every 8 (eight) hours.     [provider]  levothyroxine (SYNTHROID, LEVOTHROID) 137 MCG tablet Take 137 mcg by mouth daily. 11/10/15   [provider]  metoprolol succinate (TOPROL-XL) 25 MG 24 hr tablet Take 25 mg by mouth daily.    [provider]  montelukast (SINGULAIR) 10 MG tablet Take 10 mg by mouth  daily. 12/06/15   [provider]  Multiple Vitamin (MULTIVITAMIN WITH MINERALS) TABS tablet Take 1 tablet by mouth daily.    [provider]  Olopatadine HCl (PAZEO) 0.7 % SOLN Place 1 drop into both eyes 2 (two) times daily as needed (allergy symptoms).     [provider]  ondansetron (ZOFRAN-ODT) 4 MG disintegrating tablet Take 4 mg by mouth every 8 (eight) hours as needed for nausea. 01/13/15   [provider]  pantoprazole (PROTONIX) 40 MG tablet Take 40 mg by mouth daily.    [provider]  simvastatin (ZOCOR) 10 MG tablet Take 10 mg by mouth daily.     [provider]  tiotropium (SPIRIVA) 18 MCG inhalation  capsule Place 18 mcg into inhaler and inhale daily.    [provider]  tiZANidine (ZANAFLEX) 4 MG tablet Take 4 mg by mouth every 8 (eight) hours as needed for muscle spasms.    [provider]  traZODone (DESYREL) 100 MG tablet Take 2 tablets (200 mg total) by mouth at bedtime. Needs office visit 12/14/11   Nelva Nay, PA-C  venlafaxine XR (EFFEXOR-XR) 75 MG 24 hr capsule Take 3 capsules (225 mg total) by mouth daily. 06/14/11   Mayans, Assunta Gambles, MD  vitamin C (ASCORBIC ACID) 500 MG tablet Take 1,000 mg by mouth daily.    [provider]    Allergies Codeine  Family History  Problem Relation Age of Onset  . Diabetes Mellitus II Father     Social History Social History   Tobacco Use  . Smoking status: Former Smoker    Types: Cigarettes  . Smokeless tobacco: Never Used  Substance Use Topics  . Alcohol use: No    Comment: very rarely  . Drug use: No    Review of Systems  Constitutional: No fever/chills Eyes: No visual changes. ENT: No sore throat. Cardiovascular: Denies chest pain. Respiratory: Denies shortness of breath. Gastrointestinal: Positive for abdominal pain with nausea, no vomiting.  No diarrhea.  No constipation. Genitourinary: Negative for dysuria. Musculoskeletal: Negative for back pain. Skin: Negative for rash. Neurological: Negative for headaches, focal weakness or numbness.   ____________________________________________   PHYSICAL EXAM:  VITAL SIGNS: ED Triage Vitals  Enc Vitals Group     BP 06/27/19 0204 (!) 128/107     Pulse Rate 06/27/19 0201 (!) 118     Resp 06/27/19 0201 18     Temp 06/27/19 0201 (!) 100.9 F (38.3 C)     Temp Source 06/27/19 0201 Oral     SpO2 06/27/19 0201 98 %     Weight 06/27/19 0202 195 lb (88.5 kg)     Height 06/27/19 0202 5\' 5"  (1.651 m)     Head Circumference --      Peak Flow --      Pain Score 06/27/19 0202 10     Pain Loc --      Pain Edu? --      Excl. in GC? --      Constitutional: Alert and oriented.  Uncomfortable appearing and in mild to moderate acute distress. Eyes: Conjunctivae are normal. PERRL. EOMI. Head: Atraumatic. Nose: No congestion/rhinnorhea. Mouth/Throat: Mucous membranes are moist.   Neck: No stridor.   Cardiovascular: Normal rate, regular rhythm. Grossly normal heart sounds.  Good peripheral circulation. Respiratory: Normal respiratory effort.  No retractions. Lungs CTAB. Gastrointestinal: Soft and mildly tender to palpation right lower quadrant without rebound or guarding. No distention. No abdominal bruits. No CVA tenderness.  Musculoskeletal: No lower extremity tenderness nor edema.  No joint effusions. Neurologic:  Normal speech and language. No gross focal neurologic deficits are appreciated.  Skin:  Skin is warm, dry and intact. No rash noted.  No vesicles. Psychiatric: Mood and affect are normal. Speech and behavior are normal.  ____________________________________________   LABS (all labs ordered are listed, but only abnormal results are displayed)  Labs Reviewed  COMPREHENSIVE METABOLIC PANEL - Abnormal; Notable for the following components:      Result Value   Potassium 3.4 (*)    Glucose, Bld 176 (*)    All other components within normal limits  CBC - Abnormal; Notable for the following components:   WBC 25.6 (*)    All other components within normal limits  URINALYSIS, COMPLETE (UACMP) WITH MICROSCOPIC - Abnormal; Notable for the following components:   Color, Urine YELLOW (*)    APPearance CLOUDY (*)    pH 9.0 (*)    Ketones, ur 20 (*)    Protein, ur 100 (*)    Leukocytes,Ua LARGE (*)    WBC, UA >50 (*)    Bacteria, UA RARE (*)    All other components within normal limits  LACTIC ACID, PLASMA - Abnormal; Notable for the following components:   Lactic Acid, Venous 3.6 (*)    All other components within normal limits  DIFFERENTIAL - Abnormal; Notable for the following components:   Neutro Abs 23.9 (*)     Monocytes Absolute 1.4 (*)    Abs Immature Granulocytes 0.18 (*)    All other components within normal limits  SARS CORONAVIRUS 2 BY RT PCR (HOSPITAL ORDER, PERFORMED IN [facility])  CULTURE, BLOOD (ROUTINE X 2)  CULTURE, BLOOD (ROUTINE X 2)  URINE CULTURE  PROCALCITONIN  LIPASE, BLOOD  LACTIC ACID, PLASMA   ____________________________________________  EKG  None ____________________________________________  RADIOLOGY  ED MD interpretation:  CT pending  Official radiology report(s): No results found.  ____________________________________________   PROCEDURES  Procedure(s) performed (including Critical Care):  .1-3 Lead EKG Interpretation Performed by: Irean Hong, MD Authorized by: Irean Hong, MD     Interpretation: normal     ECG rate:  90   ECG rate assessment: normal     Rhythm: sinus rhythm     Ectopy: none     Conduction: normal   Comments:     Patient placed on cardiac monitor to evaluate for arrhythmias   CRITICAL CARE Performed by: Irean Hong   Total critical care time: 45 minutes  Critical care time was exclusive of separately billable procedures and treating other patients.  Critical care was necessary to treat or prevent imminent or life-threatening deterioration.  Critical care was time spent personally by me on the following activities: development of treatment plan with patient and/or surrogate as well as nursing, discussions with consultants, evaluation of patient's response to treatment, examination of patient, obtaining history from patient or surrogate, ordering and performing treatments and interventions, ordering and review of laboratory studies, ordering and review of radiographic studies, pulse oximetry and re-evaluation of patient's condition.   ____________________________________________   INITIAL IMPRESSION / ASSESSMENT AND PLAN / ED COURSE  As part of my medical decision making, I reviewed the following  data within the electronic MEDICAL RECORD NUMBER Nursing notes reviewed and incorporated, Labs reviewed, Old chart reviewed, Radiograph reviewed and Notes from prior ED visits     Katie Neal was evaluated in Emergency Department on 06/27/2019 for the symptoms described in  the history of present illness. She was evaluated in the context of the global COVID-19 pandemic, which necessitated consideration that the patient might be at risk for infection with the SARS-CoV-2 virus that causes COVID-19. Institutional protocols and algorithms that pertain to the evaluation of patients at risk for COVID-19 are in a state of rapid change based on information released by regulatory bodies including the CDC and federal and state organizations. These policies and algorithms were followed during the patient's care in the ED.    49 year old female presenting with right lower quadrant abdominal pain. Differential diagnosis includes, but is not limited to, ovarian cyst, ovarian torsion, acute appendicitis, diverticulitis, urinary tract infection/pyelonephritis, endometriosis, bowel obstruction, colitis, renal colic, gastroenteritis, hernia, fibroids, endometriosis, pregnancy related pain including ectopic pregnancy, etc.  Patient was febrile on arrival with tachycardia.  WBC 25.6, lactic acid 3.6.  ED code sepsis initiated.  Will initiate IV fluid resuscitation, IV antibiotics, IV Dilaudid for pain paired with IV Zofran for nausea.  We will proceed with CT abdomen/pelvis to evaluate etiology of patient's symptoms.   Clinical Course as of Jun 26 698  Sat Jun 27, 2019  0456 Rechecked oral temperature 98.1 F without antipyretics.   [JS]  [id number] Patient has only tolerated a few sips of oral contrast.  Requested fentanyl for pain.  Will send for CT scan.  Care transferred to Dr. Marisa Severin at change of shift.  Patient will require hospitalization; pending results of CT scan.   [JS]    Clinical Course User Index [JS] Irean Hong, MD     ____________________________________________   FINAL CLINICAL IMPRESSION(S) / ED DIAGNOSES  Final diagnoses:  Fever, unspecified fever cause  Right lower quadrant abdominal pain  Lower urinary tract infectious disease  Sepsis, due to unspecified organism, unspecified whether acute organ dysfunction present [facility])     ED Discharge Orders    None       Note:  This document was prepared using Dragon voice recognition software and may include unintentional dictation errors.   Irean Hong, MD 06/27/19 0254    Irean Hong, MD 06/27/19 0700

## 2019-06-27 NOTE — Consult Note (Signed)
H&P Physician requesting consult: Dr. Arta Silence  Chief Complaint: Right ureteral stone, sepsis  History of Present Illness: 49 year old female with a 2-week history of nausea and vomiting and right-sided pain.  She presented to the emergency department with leukocytosis of 25.6 and a creatinine of 0.98.  Lactate was increased.  Urinalysis consistent with UTI with positive leukocyte, rare bacteria, greater than 50 WBC.  Patient underwent a CT scan of the abdomen and pelvis with contrast that revealed a distal right ureteral calculus with upstream hydronephrosis.  Patient continues to have significant pain, nausea, vomiting.  She was febrile with some tachycardia.  She has never had stones before.  She is in significant discomfort.  Past Medical History:  Diagnosis Date  . Arthritis   . Asthma   . CRPS (complex regional pain syndrome type I)   . Depression   . IBS (irritable bowel syndrome)   . Insomnia   . PTSD (post-traumatic stress disorder)    Past Surgical History:  Procedure Laterality Date  . ABDOMINAL HYSTERECTOMY    . [place]  . APPENDECTOMY    . FOOT SURGERY  2002   due to deformity  . HIP SURGERY  2012  . KNEE ARTHROSCOPY    . LAPAROSCOPIC HYSTERECTOMY  2000  . REPAIR TENDONS LEG     x2    Home Medications:  (Not in a hospital admission)  Allergies:  Allergies  Allergen Reactions  . Codeine Itching    Can take with benadryl     Family History  Problem Relation Age of Onset  . Diabetes Mellitus II Father    Social History:  reports that she has quit smoking. Her smoking use included cigarettes. She has never used smokeless tobacco. She reports that she does not drink alcohol or use drugs.  ROS: A complete review of systems was performed.  All systems are negative except for pertinent findings as noted. ROS   Physical Exam:  Vital signs in last 24 hours: Temp:  [100.9 F (38.3 C)] 100.9 F (38.3 C) (06/05 0201) Pulse Rate:   [93-118] 110 (06/05 1000) Resp:  [14-23] 22 (06/05 0830) BP: (114-148)/(80-107) 114/82 (06/05 0800) SpO2:  [96 %-98 %] 96 % (06/05 1000) Weight:  [88.5 kg] 88.5 kg (06/05 0202) General:  Alert and oriented, moderate distress secondary to pain HEENT: Normocephalic, atraumatic Neck: No JVD or lymphadenopathy Cardiovascular: Tachycardic Lungs: Regular rate and effort Abdomen: Soft, mild right lower quadrant tenderness, nondistended, no abdominal masses Back: Right CVA tenderness Extremities: No edema Neurologic: Grossly intact  Laboratory Data:  Results for orders placed or performed during the hospital encounter of 06/27/19 (from the past 24 hour(s))  Comprehensive metabolic panel     Status: Abnormal   Collection Time: 06/27/19  2:09 AM  Result Value Ref Range   Sodium 137 135 - 145 mmol/L   Potassium 3.4 (L) 3.5 - 5.1 mmol/L   Chloride 101 98 - 111 mmol/L   CO2 22 22 - 32 mmol/L   Glucose, Bld 176 (H) 70 - 99 mg/dL   BUN 7 6 - 20 mg/dL   Creatinine, Ser 0.98 0.44 - 1.00 mg/dL   Calcium 9.0 8.9 - 10.3 mg/dL   Total Protein 7.5 6.5 - 8.1 g/dL   Albumin 4.2 3.5 - 5.0 g/dL   AST 31 15 - 41 U/L   ALT 26 0 - 44 U/L   Alkaline Phosphatase 125 38 - 126 U/L   Total Bilirubin 1.1 0.3 - 1.2  mg/dL   GFR calc non Af Amer >60 >60 mL/min   GFR calc Af Amer >60 >60 mL/min   Anion gap 14 5 - 15  CBC     Status: Abnormal   Collection Time: 06/27/19  2:09 AM  Result Value Ref Range   WBC 25.6 (H) 4.0 - 10.5 K/uL   RBC 4.50 3.87 - 5.11 MIL/uL   Hemoglobin 13.3 12.0 - 15.0 g/dL   HCT 96.2 22.9 - 79.8 %   MCV 83.6 80.0 - 100.0 fL   MCH 29.6 26.0 - 34.0 pg   MCHC 35.4 30.0 - 36.0 g/dL   RDW 92.1 19.4 - 17.4 %   Platelets 291 150 - 400 K/uL   nRBC 0.0 0.0 - 0.2 %  Lactic acid, plasma     Status: Abnormal   Collection Time: 06/27/19  2:09 AM  Result Value Ref Range   Lactic Acid, Venous 3.6 (HH) 0.5 - 1.9 mmol/L  Urinalysis, Complete w Microscopic     Status: Abnormal   Collection Time:  06/27/19  5:09 AM  Result Value Ref Range   Color, Urine YELLOW (A) YELLOW   APPearance CLOUDY (A) CLEAR   Specific Gravity, Urine 1.013 1.005 - 1.030   pH 9.0 (H) 5.0 - 8.0   Glucose, UA NEGATIVE NEGATIVE mg/dL   Hgb urine dipstick NEGATIVE NEGATIVE   Bilirubin Urine NEGATIVE NEGATIVE   Ketones, ur 20 (A) NEGATIVE mg/dL   Protein, ur 081 (A) NEGATIVE mg/dL   Nitrite NEGATIVE NEGATIVE   Leukocytes,Ua LARGE (A) NEGATIVE   RBC / HPF 21-50 0 - 5 RBC/hpf   WBC, UA >50 (H) 0 - 5 WBC/hpf   Bacteria, UA RARE (A) NONE SEEN   Squamous Epithelial / LPF 0-5 0 - 5   WBC Clumps PRESENT    Mucus PRESENT   Procalcitonin - Baseline     Status: None   Collection Time: 06/27/19  5:09 AM  Result Value Ref Range   Procalcitonin 4.13 ng/mL  Differential     Status: Abnormal   Collection Time: 06/27/19  5:09 AM  Result Value Ref Range   Neutrophils Relative % 90 %   Neutro Abs 23.9 (H) 1.7 - 7.7 K/uL   Lymphocytes Relative 4 %   Lymphs Abs 1.1 0.7 - 4.0 K/uL   Monocytes Relative 5 %   Monocytes Absolute 1.4 (H) 0.1 - 1.0 K/uL   Eosinophils Relative 0 %   Eosinophils Absolute 0.0 0.0 - 0.5 K/uL   Basophils Relative 0 %   Basophils Absolute 0.1 0.0 - 0.1 K/uL   Immature Granulocytes 1 %   Abs Immature Granulocytes 0.18 (H) 0.00 - 0.07 K/uL  Lipase, blood     Status: None   Collection Time: 06/27/19  5:09 AM  Result Value Ref Range   Lipase 18 11 - 51 U/L  SARS Coronavirus 2 by RT PCR (hospital order, performed in [facility] hospital lab) Nasopharyngeal Nasopharyngeal Swab     Status: None   Collection Time: 06/27/19  5:09 AM   Specimen: Nasopharyngeal Swab  Result Value Ref Range   SARS Coronavirus 2 NEGATIVE NEGATIVE  Lactic acid, plasma     Status: Abnormal   Collection Time: 06/27/19  6:43 AM  Result Value Ref Range   Lactic Acid, Venous 2.3 (HH) 0.5 - 1.9 mmol/L   Recent Results (from the past 240 hour(s))  SARS Coronavirus 2 by RT PCR (hospital order, performed in [facility]  hospital lab) Nasopharyngeal Nasopharyngeal Swab  Status: None   Collection Time: 06/27/19  5:09 AM   Specimen: Nasopharyngeal Swab  Result Value Ref Range Status   SARS Coronavirus 2 NEGATIVE NEGATIVE Final    Comment: (NOTE) SARS-CoV-2 target nucleic acids are NOT DETECTED. The SARS-CoV-2 RNA is generally detectable in upper and lower respiratory specimens during the acute phase of infection. The lowest concentration of SARS-CoV-2 viral copies this assay can detect is 250 copies / mL. A negative result does not preclude SARS-CoV-2 infection and should not be used as the sole basis for treatment or other patient management decisions.  A negative result may occur with improper specimen collection / handling, submission of specimen other than nasopharyngeal swab, presence of viral mutation(s) within the areas targeted by this assay, and inadequate number of viral copies (<250 copies / mL). A negative result must be combined with clinical observations, patient history, and epidemiological information. Fact Sheet for Patients:   BoilerBrush.com.cy Fact Sheet for Healthcare Providers: https://pope.com/ This test is not yet approved or cleared  by the [place] FDA and has been authorized for detection and/or diagnosis of SARS-CoV-2 by FDA under an Emergency Use Authorization (EUA).  This EUA will remain in effect (meaning this test can be used) for the duration of the COVID-19 declaration under Section 564(b)(1) of the Act, 21 U.S.C. section 360bbb-3(b)(1), unless the authorization is terminated or revoked sooner. Performed at [facility], [address], [place], [name] [address]    Creatinine: Recent Labs    06/27/19 0209  CREATININE 0.98   CT scan personally reviewed and is detailed in the history of present illness Impression/Assessment:  Right ureteral calculus Ureteral obstruction secondary to  calculus Renal colic Sepsis secondary to UTI  Plan:  She has life-threatening sepsis can [place] to UTI and obstructing stone.  Proceed emergently to the operating room for cystoscopy with right ureteral stent placement.  Risk and benefits discussed.  She is currently being treated with vancomycin and cefepime.  Ray Church, III 06/27/2019, 11:23 AM

## 2019-06-27 NOTE — ED Notes (Signed)
Took patient to the bathroom. Returned patient to her recliner. Made sure she had her call bell.

## 2019-06-27 NOTE — Progress Notes (Signed)
CODE SEPSIS - PHARMACY COMMUNICATION  **Broad Spectrum Antibiotics should be administered within 1 hour of Sepsis diagnosis**  Time Code Sepsis Called/Page Received: 0501  Antibiotics Ordered: vanc/cefepime/flagyl  Time of 1st antibiotic administration: 0629  Additional action taken by pharmacy:   If necessary, Name of Provider/Nurse Contacted:     Thomasene Ripple ,PharmD Clinical Pharmacist  06/27/2019  7:09 AM

## 2019-06-27 NOTE — Anesthesia Postprocedure Evaluation (Signed)
Anesthesia Post Note  Patient: Katie Neal  Procedure(s) Performed: CYSTOSCOPY WITH STENT PLACEMENT (Right Ureter)  Patient location during evaluation: PACU Anesthesia Type: General Level of consciousness: awake and alert Pain management: pain level controlled Vital Signs Assessment: post-procedure vital signs reviewed and stable Respiratory status: spontaneous breathing, nonlabored ventilation and respiratory function stable Cardiovascular status: blood pressure returned to baseline and stable Postop Assessment: no apparent nausea or vomiting Anesthetic complications: no     Last Vitals:  Vitals:   06/27/19 1308 06/27/19 1407  BP: 104/61 101/65  Pulse: (!) 115 (!) 114  Resp: 15 20  Temp:  36.9 C  SpO2: 96% 97%    Last Pain:  Vitals:   06/27/19 1448  TempSrc:   PainSc: Asleep                 Karleen Hampshire

## 2019-06-27 NOTE — ED Notes (Signed)
Pt unable to stop from getting and moving during IV assessment and placement

## 2019-06-27 NOTE — ED Notes (Signed)
Called to subwait, patient reports her IV came out.

## 2019-06-27 NOTE — Progress Notes (Signed)
PHARMACY -  BRIEF ANTIBIOTIC NOTE   Pharmacy has received consult(s) for vancomycin/cefepime from an ED provider.  The patient's profile has been reviewed for ht/wt/allergies/indication/available labs.    One time order(s) placed for vanc 1.75g IV load and cefepime 2g IV x 1  Further antibiotics/pharmacy consults should be ordered by admitting physician if indicated.                       Thank you,  Thomasene Ripple, PharmD, BCPS Clinical Pharmacist 06/27/2019  7:10 AM

## 2019-06-27 NOTE — Anesthesia Preprocedure Evaluation (Addendum)
Anesthesia Evaluation  Patient identified by MRN, date of birth, ID band Patient awake    Reviewed: Allergy & Precautions, H&P , NPO status , Patient's Chart, lab work & pertinent test results  Airway Mallampati: III  TM Distance: >3 FB Neck ROM: full    Dental  (+) Edentulous Upper   Pulmonary asthma , sleep apnea (does not wear CPAP) , COPD,  oxygen dependent, former smoker,  Wears 2L O2 when sleeping   breath sounds clear to auscultation       Cardiovascular negative cardio ROS   Rhythm:regular Rate:Normal     Neuro/Psych Seizures - ("stress induced seizures"),  PSYCHIATRIC DISORDERS Anxiety Depression CRPS "from the neck down"    GI/Hepatic negative GI ROS, Neg liver ROS,   Endo/Other  Hypothyroidism   Renal/GU      Musculoskeletal   Abdominal   Peds  Hematology negative hematology ROS (+)   Anesthesia Other Findings obese  Past Medical History: No date: Arthritis No date: Asthma No date: CRPS (complex regional pain syndrome type I) No date: Depression No date: IBS (irritable bowel syndrome) No date: Insomnia No date: PTSD (post-traumatic stress disorder)  Past Surgical History: No date: ABDOMINAL HYSTERECTOMY 1982: ACHILLES TENDON REPAIR No date: APPENDECTOMY 2002: FOOT SURGERY     Comment:  due to deformity 2012: HIP SURGERY No date: KNEE ARTHROSCOPY 2000: LAPAROSCOPIC HYSTERECTOMY No date: REPAIR TENDONS LEG     Comment:  x2  BMI    Body Mass Index: 32.45 kg/m      Reproductive/Obstetrics negative OB ROS                            Anesthesia Physical Anesthesia Plan  ASA: III  Anesthesia Plan: General ETT and Rapid Sequence   Post-op Pain Management:    Induction:   PONV Risk Score and Plan: Ondansetron, Dexamethasone, Midazolam and Treatment may vary due to age or medical condition  Airway Management Planned:   Additional Equipment:   Intra-op  Plan:   Post-operative Plan:   Informed Consent: I have reviewed the patients History and Physical, chart, labs and discussed the procedure including the risks, benefits and alternatives for the proposed anesthesia with the patient or authorized representative who has indicated his/her understanding and acceptance.     Dental Advisory Given  Plan Discussed with: Anesthesiologist, CRNA and Surgeon  Anesthesia Plan Comments:        Anesthesia Quick Evaluation

## 2019-06-27 NOTE — Anesthesia Procedure Notes (Signed)
Procedure Name: Intubation Date/Time: 06/27/2019 11:34 AM Performed by: Estanislado Emms, CRNA Pre-anesthesia Checklist: Patient identified, Patient being monitored, Timeout performed, Emergency Drugs available and Suction available Patient Re-evaluated:Patient Re-evaluated prior to induction Oxygen Delivery Method: Circle system utilized Preoxygenation: Pre-oxygenation with 100% oxygen Induction Type: IV induction and Rapid sequence Laryngoscope Size: Miller and 2 Grade View: Grade I Tube type: Oral Tube size: 6.5 mm Number of attempts: 1 Airway Equipment and Method: Stylet Placement Confirmation: ETT inserted through vocal cords under direct vision,  positive ETCO2 and breath sounds checked- equal and bilateral Secured at: 21 cm Tube secured with: Tape Dental Injury: Teeth and Oropharynx as per pre-operative assessment

## 2019-06-27 NOTE — Discharge Instructions (Signed)

## 2019-06-27 NOTE — ED Notes (Addendum)
Pt unable to sit in bed d/t to pain and feeling anxious, pt has been to toilet for about 1 minute every ten minutes but can't produce urine, pt requesting more pain meds, EDP notified, pt reports taking "like oral Fentanyl at home", this RN attempting to orient pt to reasonable expectation for pain control  Pt unable to attempt to drink any contrast

## 2019-06-27 NOTE — ED Notes (Signed)
Pt taken to CT.

## 2019-06-27 NOTE — ED Notes (Signed)
Pt was able to produce some urine in "hat" but placed toilet paper in sample  Pt c/o of sharp sudden right lower abdominal pain since yesterday at 1730; pt denies hx of kidney stones but reports recent UTI, pt c/o urge without urination

## 2019-06-27 NOTE — Progress Notes (Signed)
Pharmacy Antibiotic Note  Katie Neal is a 49 y.o. female admitted on 06/27/2019 with bacteremia.  Pharmacy has been consulted for Meropenem dosing.  Plan: Meropenem 1 gm IV Q8H  Height: 5\' 5"  (165.1 cm) Weight: 88.5 kg (195 lb) IBW/kg (Calculated) : 57  Temp (24hrs), Avg:99.6 F (37.6 C), Min:98.5 F (36.9 C), Max:100.9 F (38.3 C)  Recent Labs  Lab 06/27/19 0209 06/27/19 0643  WBC 25.6*  --   CREATININE 0.98  --   LATICACIDVEN 3.6* 2.3*    Estimated Creatinine Clearance: 77.1 mL/min (by C-G formula based on SCr of 0.98 mg/dL).    Allergies  Allergen Reactions  . Codeine Itching    Can take with benadryl     Antimicrobials this admission:   >>    >>   Dose adjustments this admission:   Microbiology results:  BCx:   UCx:    Sputum:    MRSA PCR:   Thank you for allowing pharmacy to be a part of this patient's care.  Beckie Viscardi D 06/27/2019 4:57 PM

## 2019-06-28 DIAGNOSIS — A4151 Sepsis due to Escherichia coli [E. coli]: Principal | ICD-10-CM

## 2019-06-28 LAB — CBC
HCT: 31 % — ABNORMAL LOW (ref 36.0–46.0)
Hemoglobin: 10.8 g/dL — ABNORMAL LOW (ref 12.0–15.0)
MCH: 30.3 pg (ref 26.0–34.0)
MCHC: 34.8 g/dL (ref 30.0–36.0)
MCV: 86.8 fL (ref 80.0–100.0)
Platelets: 200 10*3/uL (ref 150–400)
RBC: 3.57 MIL/uL — ABNORMAL LOW (ref 3.87–5.11)
RDW: 12.8 % (ref 11.5–15.5)
WBC: 20.6 10*3/uL — ABNORMAL HIGH (ref 4.0–10.5)
nRBC: 0 % (ref 0.0–0.2)

## 2019-06-28 LAB — BASIC METABOLIC PANEL
Anion gap: 8 (ref 5–15)
BUN: 13 mg/dL (ref 6–20)
CO2: 27 mmol/L (ref 22–32)
Calcium: 8.7 mg/dL — ABNORMAL LOW (ref 8.9–10.3)
Chloride: 107 mmol/L (ref 98–111)
Creatinine, Ser: 1.02 mg/dL — ABNORMAL HIGH (ref 0.44–1.00)
GFR calc Af Amer: >60 mL/min (ref >60)
GFR calc non Af Amer: >60 mL/min (ref >60)
Glucose, Bld: 141 mg/dL — ABNORMAL HIGH (ref 70–99)
Potassium: 3.3 mmol/L — ABNORMAL LOW (ref 3.5–5.1)
Sodium: 142 mmol/L (ref 135–145)

## 2019-06-28 LAB — GLUCOSE, CAPILLARY
Glucose-Capillary: 96 mg/dL (ref 70–99)
Glucose-Capillary: 175 mg/dL — ABNORMAL HIGH (ref 70–99)
Glucose-Capillary: 121 mg/dL — ABNORMAL HIGH (ref 70–99)

## 2019-06-28 MED ORDER — OXYCODONE-ACETAMINOPHEN 5-325 MG PO TABS
1.0000 | ORAL_TABLET | ORAL | Status: DC | PRN
  Administered 2019-06-28 – 2019-06-29 (×3): 2 via ORAL
  Administered 2019-06-29 (×2): 1 via ORAL
  Filled 2019-06-28: qty 2
  Filled 2019-06-28: qty 1
  Filled 2019-06-28 (×3): qty 2

## 2019-06-28 MED ORDER — HOME MED STORE IN PYXIS: 1.0000 | Freq: Three times a day (TID) | Status: DC

## 2019-06-28 MED ORDER — POTASSIUM CHLORIDE CRYS ER 20 MEQ PO TBCR
40.0000 meq | EXTENDED_RELEASE_TABLET | Freq: Once | ORAL | Status: AC
  Administered 2019-06-28: 40 meq via ORAL
  Filled 2019-06-28: qty 2

## 2019-06-28 MED ORDER — TRAZODONE HCL 100 MG PO TABS
200.0000 mg | ORAL_TABLET | Freq: Every day | ORAL | Status: DC
  Administered 2019-06-29: 200 mg via ORAL
  Filled 2019-06-28: qty 2

## 2019-06-28 MED ORDER — FENTANYL CITRATE (PF) 100 MCG/2ML IJ SOLN
25.0000 ug | INTRAMUSCULAR | Status: DC | PRN
  Administered 2019-06-28 (×2): 25 ug via INTRAVENOUS
  Filled 2019-06-28 (×2): qty 2

## 2019-06-28 NOTE — Progress Notes (Signed)
PROGRESS NOTE    Katie Neal   YHC:[id number]  DOB: Mar 16, 1970  PCP: Rayetta Humphrey, MD    DOA: 06/27/2019 LOS: 1   Brief Narrative   Katie Neal is a 49 y.o. female with medical history of hyperlipidemia, asthma, GERD, hypothyroidism, depression with anxiety, PTSD, IBS, CRPS (complex regional pain syndrome type I, involving internal organs including bladder), insomnia, seizure, who presented to the ED on 06/27/19 with RLQ abdominal pain, severe, constant, sharp in nature, with radiation to the right flank.  Associated nausea without vomiting, 2 episodes of diarrhea day before.  No UTI symptoms but reported feeling suprapubic fullness.  In the ED, febrile 100.9 F, tachycardic HR 118, BP 128/107 >> 143/80, otherwise normal vitals.  Labs notable for WBC 25.6k, lactic acidosis 3.6 >> 2.3, UA consistent with infection, K 3.4, normal renal function.  CT abdomen/pelvis showed right 6 mm of ureteral stone with moderate obstructive uropathy.  Patient admitted to hospitalist service with urology consulted.  She underwent urgent right ureteral stent placement same day of admission.      Assessment & Plan   Principal Problem:   Right ureteral stone Active Problems:   CRPS (complex regional pain syndrome type I)   Asthma   Depression with anxiety   HLD (hyperlipidemia)   Hypothyroidism   Seizure (HCC)   Sepsis (HCC)   Acute pyelonephritis    Sepsis due to Acute Pyelonephritis Right ureteral stone  E. Coli Bacteremia -  Presented with leukocytosis, fever, tachycardia.  Lactic acid 3.6 >> 2.3. Dr. Alvester Morin of urology consulted.  Patient taken for urgent cystoscopy with rightretrograde pyelogram and rightureteral stent placement.  Received Vancomycin, Flagyl and Cefepime and fluid resuscitation in ED.   --continue IV meropenem to cover ESBL empirically --follow cultures --IV Fentanyl PRN --Patient to have home levorphanol pain medicine brought from home, order placed for  this. --follow up lactates and procal --continue IV fluids --Urology following   CRPS (complex regional pain syndrome type I): Patient requests not to touch her without asking anywhere below shoulders due to severe pain.  Also has internal organ pain with this. --PRN fentanyl as above, home medication order placed when someone brings it for her.    Asthma - stable. Bronchodilators ordered.  Depression and anxiety - Stable, no suicidal or homicidal ideations.  Continue home medications   HLD - continue Zocor  Hypothyroidism - continue Synthroid  Seizure disorder - Seizure precautions.  PRN Ativan for seizure activity.  Continue home Lamictal.   Patient BMI: Body mass index is 33.28 kg/m.   DVT prophylaxis: SCD's  Diet:  Diet Orders (From admission, onward)    Start     Ordered   06/27/19 1346  Diet NPO time specified Except for: Sips with Meds, Ice Chips  Diet effective now    Question Answer Comment  Except for Sips with Meds   Except for Ice Chips      06/27/19 1345            Code Status: Full Code    Subjective 06/28/19    Patient seen at bedside. Reports that she can't really tell how much RLQ/right flank pain she has currently due to her underlying CRPS pain, she hurts everywhere.  She states only medicine that works for pain with her is levophenol which she will have someone bring in for her.  Denies fevers  No other specific complaints.   Disposition Plan & Communication   Status is: Inpatient  Inpatient status  remains appropriate because of severity of illness and continued need for IV medications and pending cultures. D/c pending urology sign off.  Dispo: The patient is from: home              Anticipated d/c is to: home              Anticipated d/c date is: 1-2 days              Patient currently is not medically stable for discharge.   Family Communication: none at bedside, will attempt to call   Consults, Procedures, Significant Events    Consultants:   Urology  Procedures:   R ureteral stent placement 06/27/19  Antimicrobials:   Meropenem   Objective   Vitals:   06/27/19 1756 06/27/19 1949 06/28/19 0407 06/28/19 0728  BP: 136/79 137/78 102/82 (!) 116/98  Pulse: (!) 104 (!) 103 97 99  Resp: 18 18 20 18   Temp: 98.4 F (36.9 C) 98.5 F (36.9 C) 98 F (36.7 C) 98.3 F (36.8 C)  TempSrc: Oral Oral Oral   SpO2: 97% 98% 100% 100%  Weight:   90.7 kg   Height:        Intake/Output Summary (Last 24 hours) at 06/28/2019 0824 Last data filed at 06/28/2019 0615 Gross per 24 hour  Intake 3202.26 ml  Output 2250 ml  Net 952.26 ml   Filed Weights   06/27/19 0202 06/27/19 1407 06/28/19 0407  Weight: 88.5 kg 91.7 kg 90.7 kg    Physical Exam: CAVEAT: exam limited by patient's intolerance to touch due to her CRPS  General exam: awake, alert, no acute distress, obese Respiratory system: CTAB, no wheezes, rales or rhonchi, normal respiratory effort. Cardiovascular system: normal S1/S2, RRR, trace pedal edema.   Gastrointestinal system: very limited exam, seems diffusely tender Central nervous system: A&O x4. no gross focal neurologic deficits, normal speech Skin: dry, intact, normal temperature, pale Psychiatry: normal mood, congruent affect, judgement and insight appear normal  Labs   Data Reviewed: I have personally reviewed following labs and imaging studies  CBC: Recent Labs  Lab 06/27/19 0209 06/27/19 0509 06/28/19 0537  WBC 25.6*  --  20.6*  NEUTROABS  --  23.9*  --   HGB 13.3  --  10.8*  HCT 37.6  --  31.0*  MCV 83.6  --  86.8  PLT 291  --  200   Basic Metabolic Panel: Recent Labs  Lab 06/27/19 0209 06/28/19 0537  NA 137 142  K 3.4* 3.3*  CL 101 107  CO2 22 27  GLUCOSE 176* 141*  BUN 7 13  CREATININE 0.98 1.02*  CALCIUM 9.0 8.7*   GFR: Estimated Creatinine Clearance: 75.1 mL/min (A) (by C-G formula based on SCr of 1.02 mg/dL (H)). Liver Function Tests: Recent Labs  Lab  06/27/19 0209  AST 31  ALT 26  ALKPHOS 125  BILITOT 1.1  PROT 7.5  ALBUMIN 4.2   Recent Labs  Lab 06/27/19 0509  LIPASE 18   No results for input(s): AMMONIA in the last 168 hours. Coagulation Profile: Recent Labs  Lab 06/27/19 1354  INR 1.2   Cardiac Enzymes: No results for input(s): CKTOTAL, CKMB, CKMBINDEX, TROPONINI in the last 168 hours. BNP (last 3 results) No results for input(s): PROBNP in the last 8760 hours. HbA1C: No results for input(s): HGBA1C in the last 72 hours. CBG: Recent Labs  Lab 06/28/19 0726  GLUCAP 96   Lipid Profile: No results for input(s): CHOL, HDL, LDLCALC,  TRIG, CHOLHDL, LDLDIRECT in the last 72 hours. Thyroid Function Tests: No results for input(s): TSH, T4TOTAL, FREET4, T3FREE, THYROIDAB in the last 72 hours. Anemia Panel: No results for input(s): VITAMINB12, FOLATE, FERRITIN, TIBC, IRON, RETICCTPCT in the last 72 hours. Sepsis Labs: Recent Labs  Lab 06/27/19 0209 06/27/19 0509 06/27/19 0643 06/27/19 1915 06/27/19 2225  PROCALCITON  --  4.13  --  15.82  --   LATICACIDVEN 3.6*  --  2.3* 1.3 1.2    Recent Results (from the past 240 hour(s))  Culture, blood (routine x 2)     Status: None (Preliminary result)   Collection Time: 06/27/19  5:09 AM   Specimen: BLOOD  Result Value Ref Range Status   Specimen Description BLOOD BLOOD LEFT FOREARM  Final   Special Requests   Final    BOTTLES DRAWN AEROBIC AND ANAEROBIC Blood Culture adequate volume   Culture  Setup Time   Final    GRAM NEGATIVE RODS IN BOTH AEROBIC AND ANAEROBIC BOTTLES CRITICAL RESULT CALLED TO, READ BACK BY AND VERIFIED WITH: JASON ROBBINS AT 1629 06/27/19.PMF Performed at [facility], [address] [place], [place] [address]    Culture GRAM NEGATIVE RODS  Final   Report Status PENDING  Incomplete  SARS Coronavirus 2 by RT PCR (hospital order, performed in [facility] hospital lab) Nasopharyngeal Nasopharyngeal Swab     Status: None   Collection  Time: 06/27/19  5:09 AM   Specimen: Nasopharyngeal Swab  Result Value Ref Range Status   SARS Coronavirus 2 NEGATIVE NEGATIVE Final    Comment: (NOTE) SARS-CoV-2 target nucleic acids are NOT DETECTED. The SARS-CoV-2 RNA is generally detectable in upper and lower respiratory specimens during the acute phase of infection. The lowest concentration of SARS-CoV-2 viral copies this assay can detect is 250 copies / mL. A negative result does not preclude SARS-CoV-2 infection and should not be used as the sole basis for treatment or other patient management decisions.  A negative result may occur with improper specimen collection / handling, submission of specimen other than nasopharyngeal swab, presence of viral mutation(s) within the areas targeted by this assay, and inadequate number of viral copies (<250 copies / mL). A negative result must be combined with clinical observations, patient history, and epidemiological information. Fact Sheet for Patients:   BoilerBrush.com.cyhttps://www.fda.gov/media/136312/download Fact Sheet for Healthcare Providers: https://pope.com/https://www.fda.gov/media/136313/download This test is not yet approved or cleared  by the [place] FDA and has been authorized for detection and/or diagnosis of SARS-CoV-2 by FDA under an Emergency Use Authorization (EUA).  This EUA will remain in effect (meaning this test can be used) for the duration of the COVID-19 declaration under Section 564(b)(1) of the Act, 21 U.S.C. section 360bbb-3(b)(1), unless the authorization is terminated or revoked sooner. Performed at [facility], [address] [place], [place] [address]   Culture, blood (routine x 2)     Status: None (Preliminary result)   Collection Time: 06/27/19  6:25 AM   Specimen: BLOOD  Result Value Ref Range Status   Specimen Description BLOOD BLOOD LEFT HAND  Final   Special Requests   Final    BOTTLES DRAWN AEROBIC AND ANAEROBIC Blood Culture adequate volume   Culture  Setup  Time   Final    Organism ID to follow GRAM NEGATIVE RODS IN BOTH AEROBIC AND ANAEROBIC BOTTLES CRITICAL RESULT CALLED TO, READ BACK BY AND VERIFIED WITH: JASON ROBBINS AT 1629 06/27/19.PMF Performed at [facility], [address] [place], [place]  [address]    Culture GRAM NEGATIVE RODS  Final   Report Status PENDING  Incomplete  Blood Culture ID Panel (Reflexed)     Status: Abnormal   Collection Time: 06/27/19  6:25 AM  Result Value Ref Range Status   Enterococcus species NOT DETECTED NOT DETECTED Final   Listeria monocytogenes NOT DETECTED NOT DETECTED Final   Staphylococcus species NOT DETECTED NOT DETECTED Final   Staphylococcus aureus (BCID) NOT DETECTED NOT DETECTED Final   Streptococcus species NOT DETECTED NOT DETECTED Final   Streptococcus agalactiae NOT DETECTED NOT DETECTED Final   Streptococcus pneumoniae NOT DETECTED NOT DETECTED Final   Streptococcus pyogenes NOT DETECTED NOT DETECTED Final   Acinetobacter baumannii NOT DETECTED NOT DETECTED Final   Enterobacteriaceae species DETECTED (A) NOT DETECTED Final    Comment: Enterobacteriaceae represent a large family of gram-negative bacteria, not a single organism. CRITICAL RESULT CALLED TO, READ BACK BY AND VERIFIED WITH: JASON ROBBINS AT 1696 06/27/19.PMF    Enterobacter cloacae complex NOT DETECTED NOT DETECTED Final   Escherichia coli DETECTED (A) NOT DETECTED Final    Comment: CRITICAL RESULT CALLED TO, READ BACK BY AND VERIFIED WITH: JASON ROBBINS AT 7893 06/27/19.PMF    Klebsiella oxytoca NOT DETECTED NOT DETECTED Final   Klebsiella pneumoniae NOT DETECTED NOT DETECTED Final   Proteus species NOT DETECTED NOT DETECTED Final   Serratia marcescens NOT DETECTED NOT DETECTED Final   Carbapenem resistance NOT DETECTED NOT DETECTED Final   Haemophilus influenzae NOT DETECTED NOT DETECTED Final   Neisseria meningitidis NOT DETECTED NOT DETECTED Final   Pseudomonas aeruginosa NOT DETECTED NOT DETECTED Final    Candida albicans NOT DETECTED NOT DETECTED Final   Candida glabrata NOT DETECTED NOT DETECTED Final   Candida krusei NOT DETECTED NOT DETECTED Final   Candida parapsilosis NOT DETECTED NOT DETECTED Final   Candida tropicalis NOT DETECTED NOT DETECTED Final    Comment: Performed at [facility], [place], [place] [address]      Imaging Studies   CT Abdomen Pelvis W Contrast  Result Date: 06/27/2019 CLINICAL DATA:  49 year old female presenting with right lower quadrant abdominal pain. Patient was febrile on arrival with tachycardia. WBC 25.6, lactic acid 3.6. ED code sepsis initiated. EXAM: CT ABDOMEN AND PELVIS WITH CONTRAST TECHNIQUE: Multidetector CT imaging of the abdomen and pelvis was performed using the standard protocol following bolus administration of intravenous contrast. CONTRAST:  147mL OMNIPAQUE IOHEXOL 300 MG/ML  SOLN COMPARISON:  10/04/2008 FINDINGS: Lower chest: Right lower lobe dependent opacity consistent with atelectasis. Left lung base is clear. Hepatobiliary: Liver normal in size. Diffuse decreased liver attenuation consistent with fatty infiltration. No liver mass or focal lesion. Normal gallbladder. No bile duct dilation. Pancreas: Unremarkable. No pancreatic ductal dilatation or surrounding inflammatory changes. Spleen: Normal in size without focal abnormality. Adrenals/Urinary Tract: No adrenal masses. There is moderate right hydronephrosis, mild relative decrease in right renal enhancement and right perinephric stranding. The right ureter is dilated into the pelvis to the level of a 6 mm distal ureteral stone. No other ureteral stones. No renal masses. No intrarenal stones. No left intrarenal collecting system dilation. Normal left ureter. Bladder is unremarkable. Stomach/Bowel: Moderate distention of the stomach. Stomach otherwise unremarkable. Small bowel and colon are normal in caliber. No wall thickening. No inflammation. Previous appendectomy.  Vascular/Lymphatic: Minor aortic atherosclerosis. No aneurysm. No other vascular abnormality. No enlarged lymph nodes. Reproductive: Status post hysterectomy. No adnexal masses. Other: No abdominal wall hernia or abnormality. No abdominopelvic ascites.  Musculoskeletal: No acute or significant osseous findings. IMPRESSION: 1. 6 mm distal right ureteral stone causes moderate right obstructive uropathy. 2. No other acute finding.  No intrarenal stones. 3. Diffuse hepatic steatosis. 4. Status post hysterectomy and appendectomy. 5. Minor aortic atherosclerosis. Electronically Signed   By: Amie Portland M.D.   On: 06/27/2019 09:13   DG OR UROLOGY CYSTO IMAGE ([facility] ONLY)  Result Date: 06/27/2019 There is no interpretation for this exam.  This order is for images obtained during a surgical procedure.  Please See "Surgeries" Tab for more information regarding the procedure.     Medications   Scheduled Meds: . amitriptyline  25 mg Oral QHS  . vitamin C  1,000 mg Oral Daily  . Chlorhexidine Gluconate Cloth  6 each Topical Q0600  . dicyclomine  20 mg Oral TID AC  . fluticasone  2 spray Each Nare Daily  . Gabapentin Enacarbil  600 mg Oral BID  . hydrOXYzine  25 mg Oral TID  . ipratropium  2 spray Each Nare Q12H  . lamoTRIgine  100 mg Oral q morning - 10a  . lamoTRIgine  200 mg Oral QPM  . levorphanol  2 mg Oral Q8H  . levothyroxine  137 mcg Oral Daily  . loratadine  10 mg Oral QPM  . metoprolol succinate  25 mg Oral Daily  . mometasone-formoterol  2 puff Inhalation BID  . montelukast  10 mg Oral Daily  . multivitamin with minerals  1 tablet Oral Daily  . pantoprazole  40 mg Oral Daily  . QUEtiapine  25 mg Oral QHS  . simvastatin  10 mg Oral Daily  . tiotropium  18 mcg Inhalation Daily   Continuous Infusions: . sodium chloride 75 mL/hr at 06/28/19 0543  . meropenem (MERREM) IV Stopped (06/28/19 0615)       LOS: 1 day    Time spent: 30 minutes    Pennie Banter, DO [facility]  06/28/2019, 8:24 AM    If 7PM-7AM, please contact night-coverage. How to contact the [facility] Attending or Consulting provider 7A - 7P or covering provider during after hours 7P -7A, for this patient?    1. Check the care team in [facility] and look for a) attending/consulting [facility] provider listed and b) the [facility] team listed 2. Log into [email or web address] and use [facility] universal password to access. If you do not have the password, please contact the hospital operator. 3. Locate the [facility] provider you are looking for under [facility] and page to a number that you can be directly reached. 4. If you still have difficulty reaching the provider, please page the [facility] (Director on Call) for the Hospitalists listed on amion for assistance.

## 2019-06-28 NOTE — Hospital Course (Signed)
Katie Neal is a 49 y.o. female with medical history of hyperlipidemia, asthma, GERD, hypothyroidism, depression with anxiety, PTSD, IBS, CRPS (complex regional pain syndrome type I, involving internal organs including bladder), insomnia, seizure, who presented to the ED on 06/27/19 with RLQ abdominal pain, severe, constant, sharp in nature, with radiation to the right flank.  Associated nausea without vomiting, 2 episodes of diarrhea day before.  No UTI symptoms but reported feeling suprapubic fullness.  In the ED, febrile 100.9 F, tachycardic HR 118, BP 128/107 >> 143/80, otherwise normal vitals.  Labs notable for WBC 25.6k, lactic acidosis 3.6 >> 2.3, UA consistent with infection, K 3.4, normal renal function.  CT abdomen/pelvis showed right 6 mm of ureteral stone with moderate obstructive uropathy.  Patient admitted to hospitalist service with urology consulted.  She underwent urgent right ureteral stent placement same day of admission.

## 2019-06-28 NOTE — Progress Notes (Signed)
Urology Inpatient Progress Report  Lower urinary tract infectious disease [N39.0] Right lower quadrant abdominal pain [R10.31] Fever, unspecified fever cause [R50.9] Sepsis, due to unspecified organism, unspecified whether acute organ dysfunction present (HCC) [A41.9] Acute pyelonephritis [N10]  Procedure(s): CYSTOSCOPY WITH STENT PLACEMENT  1 Day Post-Op   Intv/Subj: Patient is having some renal colic on the right which is not unexpected.  Blood culture is positive for E. coli.  Sensitivities pending.  Urine culture with 60,000 gram-negative rods.  She is currently on ceftriaxone and meropenem.  She has been afebrile.  Mild tachycardia but blood pressure is okay.  She has had good urine output.  Principal Problem:   Right ureteral stone Active Problems:   CRPS (complex regional pain syndrome type I)   Asthma   Depression with anxiety   HLD (hyperlipidemia)   Hypothyroidism   Seizure (HCC)   Sepsis (HCC)   Acute pyelonephritis  Current Facility-Administered Medications  Medication Dose Route Frequency Provider Last Rate Last Admin   0.9 %  sodium chloride infusion   Intravenous Continuous Lorretta Harp, MD 75 mL/hr at 06/28/19 0543 New Bag at 06/28/19 0543   acetaminophen (TYLENOL) tablet 650 mg  650 mg Oral Q6H PRN Lorretta Harp, MD       albuterol (PROVENTIL) (2.5 MG/3ML) 0.083% nebulizer solution 3 mL  3 mL Inhalation Q4H PRN Lorretta Harp, MD       amitriptyline (ELAVIL) tablet 25 mg  25 mg Oral QHS Lorretta Harp, MD   25 mg at 06/27/19 2137   ascorbic acid (VITAMIN C) tablet 1,000 mg  1,000 mg Oral Daily Lorretta Harp, MD   1,000 mg at 06/28/19 1038   Chlorhexidine Gluconate Cloth 2 % PADS 6 each  6 each Topical Q0600 Lorretta Harp, MD       dextromethorphan-guaiFENesin (MUCINEX DM) 30-600 MG per 12 hr tablet 1 tablet  1 tablet Oral BID PRN Lorretta Harp, MD       dicyclomine (BENTYL) tablet 20 mg  20 mg Oral TID Langley Gauss, MD   20 mg at 06/28/19 0912   fentaNYL (SUBLIMAZE)  injection 25 mcg  25 mcg Intravenous Q2H PRN Esaw Grandchild A, DO   25 mcg at 06/28/19 0912   fluticasone (FLONASE) 50 MCG/ACT nasal spray 2 spray  2 spray Each Nare Daily Lorretta Harp, MD   2 spray at 06/28/19 0913   Gabapentin Enacarbil TBCR 600 mg  600 mg Oral BID Lorretta Harp, MD       home med stored in pyxis 1 each  1 each Oral Q8H Esaw Grandchild A, DO       hydrOXYzine (ATARAX/VISTARIL) tablet 25 mg  25 mg Oral TID Lorretta Harp, MD   25 mg at 06/28/19 1039   iohexol (OMNIPAQUE) 9 MG/ML oral solution 500 mL  500 mL Oral Once PRN Lorretta Harp, MD   500 mL at 06/27/19 0512   ipratropium (ATROVENT) 0.03 % nasal spray 2 spray  2 spray Each Nare Q12H Lorretta Harp, MD   2 spray at 06/28/19 0231   lamoTRIgine (LAMICTAL) tablet 100 mg  100 mg Oral q morning - 10a Lorretta Harp, MD   100 mg at 06/28/19 1038   lamoTRIgine (LAMICTAL) tablet 200 mg  200 mg Oral QPM Lorretta Harp, MD   200 mg at 06/27/19 1821   levorphanol (LEVODROMORAN) tablet 2 mg  2 mg Oral Q8H Lorretta Harp, MD       levothyroxine (SYNTHROID) tablet 137 mcg  137 mcg Oral Daily  Lorretta HarpNiu, Xilin, MD   137 mcg at 06/28/19 0546   loratadine (CLARITIN) tablet 10 mg  10 mg Oral QPM Lorretta HarpNiu, Xilin, MD   10 mg at 06/27/19 1822   LORazepam (ATIVAN) injection 1 mg  1 mg Intravenous Q2H PRN Lorretta HarpNiu, Xilin, MD       meropenem (MERREM) 1 g in sodium chloride 0.9 % 100 mL IVPB  1 g Intravenous Q8H Lorretta HarpNiu, Xilin, MD   Stopped at 06/28/19 0615   metoprolol succinate (TOPROL-XL) 24 hr tablet 25 mg  25 mg Oral Daily Lorretta HarpNiu, Xilin, MD   25 mg at 06/28/19 1038   mometasone-formoterol (DULERA) 100-5 MCG/ACT inhaler 2 puff  2 puff Inhalation BID Lorretta HarpNiu, Xilin, MD   2 puff at 06/28/19 0913   montelukast (SINGULAIR) tablet 10 mg  10 mg Oral Daily Lorretta HarpNiu, Xilin, MD   10 mg at 06/28/19 1038   multivitamin with minerals tablet 1 tablet  1 tablet Oral Daily Lorretta HarpNiu, Xilin, MD   1 tablet at 06/28/19 1038   Olopatadine HCl 0.7 % SOLN 1 drop  1 drop Both Eyes BID PRN Lorretta HarpNiu, Xilin, MD        ondansetron [facility](ZOFRAN) injection 4 mg  4 mg Intravenous Q8H PRN Lorretta HarpNiu, Xilin, MD   4 mg at 06/27/19 1441   oxyCODONE-acetaminophen (PERCOCET/ROXICET) 5-325 MG per tablet 1-2 tablet  1-2 tablet Oral Q4H PRN Esaw GrandchildGriffith, Kelly A, DO   2 tablet at 06/28/19 1039   pantoprazole (PROTONIX) EC tablet 40 mg  40 mg Oral Daily Lorretta HarpNiu, Xilin, MD   40 mg at 06/28/19 1038   QUEtiapine (SEROQUEL) tablet 25 mg  25 mg Oral QHS Lorretta HarpNiu, Xilin, MD   25 mg at 06/27/19 2137   simvastatin (ZOCOR) tablet 10 mg  10 mg Oral Daily Lorretta HarpNiu, Xilin, MD   10 mg at 06/28/19 1039   tiotropium (SPIRIVA) inhalation capsule ([facility] use ONLY) 18 mcg  18 mcg Inhalation Daily Lorretta HarpNiu, Xilin, MD   18 mcg at 06/28/19 0913   tiZANidine (ZANAFLEX) tablet 4 mg  4 mg Oral Q8H PRN Lorretta HarpNiu, Xilin, MD   4 mg at 06/28/19 1041     Objective: Vital: Vitals:   06/27/19 1949 06/28/19 0407 06/28/19 0728 06/28/19 1208  BP: 137/78 102/82 (!) 116/98 119/79  Pulse: (!) 103 97 99 89  Resp: 18 20 18 18   Temp: 98.5 F (36.9 C) 98 F (36.7 C) 98.3 F (36.8 C) 98.2 F (36.8 C)  TempSrc: Oral Oral    SpO2: 98% 100% 100% 99%  Weight:  90.7 kg    Height:       I/Os: I/O last 3 completed shifts: In: 4302.3 [I.V.:1307.7; IV Piggyback:2994.6] Out: 2250 [Urine:2250]  Physical Exam:  General: Patient is in no apparent distress Lungs: Normal respiratory effort, chest expands symmetrically. GI: The abdomen is soft and nontender without mass. Foley: Draining clear yellow urine Ext: lower extremities symmetric  Lab Results: Recent Labs    06/27/19 0209 06/28/19 0537  WBC 25.6* 20.6*  HGB 13.3 10.8*  HCT 37.6 31.0*   Recent Labs    06/27/19 0209 06/28/19 0537  NA 137 142  K 3.4* 3.3*  CL 101 107  CO2 22 27  GLUCOSE 176* 141*  BUN 7 13  CREATININE 0.98 1.02*  CALCIUM 9.0 8.7*   Recent Labs    06/27/19 1354  INR 1.2   No results for input(s): LABURIN in the last 72 hours. Results for orders placed or performed during the hospital encounter of  06/27/19  Culture, blood (routine x 2)     Status: None (Preliminary result)   Collection Time: 06/27/19  5:09 AM   Specimen: BLOOD  Result Value Ref Range Status   Specimen Description BLOOD BLOOD LEFT FOREARM  Final   Special Requests   Final    BOTTLES DRAWN AEROBIC AND ANAEROBIC Blood Culture adequate volume   Culture  Setup Time   Final    GRAM NEGATIVE RODS IN BOTH AEROBIC AND ANAEROBIC BOTTLES CRITICAL RESULT CALLED TO, READ BACK BY AND VERIFIED WITH: JASON ROBBINS AT 1629 06/27/19.PMF Performed at [facility], [address] [place], [name] [address]    Culture GRAM NEGATIVE RODS  Final   Report Status PENDING  Incomplete  Urine culture     Status: Abnormal (Preliminary result)   Collection Time: 06/27/19  5:09 AM   Specimen: Urine, Random  Result Value Ref Range Status   Specimen Description   Final    URINE, RANDOM Performed at [facility], [address], [place], [name] [address]    Special Requests   Final    NONE Performed at [facility], [address] [place], [name] [address]    Culture 60,000 COLONIES/mL GRAM NEGATIVE RODS (A)  Final   Report Status PENDING  Incomplete  SARS Coronavirus 2 by RT PCR (hospital order, performed in [facility] hospital lab) Nasopharyngeal Nasopharyngeal Swab     Status: None   Collection Time: 06/27/19  5:09 AM   Specimen: Nasopharyngeal Swab  Result Value Ref Range Status   SARS Coronavirus 2 NEGATIVE NEGATIVE Final    Comment: (NOTE) SARS-CoV-2 target nucleic acids are NOT DETECTED. The SARS-CoV-2 RNA is generally detectable in upper and lower respiratory specimens during the acute phase of infection. The lowest concentration of SARS-CoV-2 viral copies this assay can detect is 250 copies / mL. A negative result does not preclude SARS-CoV-2 infection and should not be used as the sole basis for treatment or other patient management decisions.  A negative result may occur with improper  specimen collection / handling, submission of specimen other than nasopharyngeal swab, presence of viral mutation(s) within the areas targeted by this assay, and inadequate number of viral copies (<250 copies / mL). A negative result must be combined with clinical observations, patient history, and epidemiological information. Fact Sheet for Patients:   BoilerBrush.com.cy Fact Sheet for Healthcare Providers: https://pope.com/ This test is not yet approved or cleared  by the [place] FDA and has been authorized for detection and/or diagnosis of SARS-CoV-2 by FDA under an Emergency Use Authorization (EUA).  This EUA will remain in effect (meaning this test can be used) for the duration of the COVID-19 declaration under Section 564(b)(1) of the Act, 21 U.S.C. section 360bbb-3(b)(1), unless the authorization is terminated or revoked sooner. Performed at [facility], [address] [place], [name] [address]   Culture, blood (routine x 2)     Status: Abnormal (Preliminary result)   Collection Time: 06/27/19  6:25 AM   Specimen: BLOOD  Result Value Ref Range Status   Specimen Description   Final    BLOOD BLOOD LEFT HAND Performed at [facility], [address] [place], [name] [address]    Special Requests   Final    BOTTLES DRAWN AEROBIC AND ANAEROBIC Blood Culture adequate volume Performed at [facility], [address], [place], [name] [address]    Culture  Setup Time   Final    GRAM NEGATIVE RODS IN  BOTH AEROBIC AND ANAEROBIC BOTTLES CRITICAL RESULT CALLED TO, READ BACK BY AND VERIFIED WITH: JASON ROBBINS AT 1629 06/27/19.PMF Performed at [facility], [place] [address], [place], [place] [address]    Culture ESCHERICHIA COLI (A)  Final   Report Status PENDING  Incomplete  Blood Culture ID Panel (Reflexed)     Status: Abnormal   Collection Time: 06/27/19  6:25 AM  Result Value Ref Range  Status   Enterococcus species NOT DETECTED NOT DETECTED Final   Listeria monocytogenes NOT DETECTED NOT DETECTED Final   Staphylococcus species NOT DETECTED NOT DETECTED Final   Staphylococcus aureus (BCID) NOT DETECTED NOT DETECTED Final   Streptococcus species NOT DETECTED NOT DETECTED Final   Streptococcus agalactiae NOT DETECTED NOT DETECTED Final   Streptococcus pneumoniae NOT DETECTED NOT DETECTED Final   Streptococcus pyogenes NOT DETECTED NOT DETECTED Final   Acinetobacter baumannii NOT DETECTED NOT DETECTED Final   Enterobacteriaceae species DETECTED (A) NOT DETECTED Final    Comment: Enterobacteriaceae represent a large family of gram-negative bacteria, not a single organism. CRITICAL RESULT CALLED TO, READ BACK BY AND VERIFIED WITH: JASON ROBBINS AT 7353 06/27/19.PMF    Enterobacter cloacae complex NOT DETECTED NOT DETECTED Final   Escherichia coli DETECTED (A) NOT DETECTED Final    Comment: CRITICAL RESULT CALLED TO, READ BACK BY AND VERIFIED WITH: JASON ROBBINS AT 2992 06/27/19.PMF    Klebsiella oxytoca NOT DETECTED NOT DETECTED Final   Klebsiella pneumoniae NOT DETECTED NOT DETECTED Final   Proteus species NOT DETECTED NOT DETECTED Final   Serratia marcescens NOT DETECTED NOT DETECTED Final   Carbapenem resistance NOT DETECTED NOT DETECTED Final   Haemophilus influenzae NOT DETECTED NOT DETECTED Final   Neisseria meningitidis NOT DETECTED NOT DETECTED Final   Pseudomonas aeruginosa NOT DETECTED NOT DETECTED Final   Candida albicans NOT DETECTED NOT DETECTED Final   Candida glabrata NOT DETECTED NOT DETECTED Final   Candida krusei NOT DETECTED NOT DETECTED Final   Candida parapsilosis NOT DETECTED NOT DETECTED Final   Candida tropicalis NOT DETECTED NOT DETECTED Final    Comment: Performed at [facility], [place], [place] [address]    Studies/Results: CT Abdomen Pelvis W Contrast  Result Date: 06/27/2019 CLINICAL DATA:  49 year old female  presenting with right lower quadrant abdominal pain. Patient was febrile on arrival with tachycardia. WBC 25.6, lactic acid 3.6. ED code sepsis initiated. EXAM: CT ABDOMEN AND PELVIS WITH CONTRAST TECHNIQUE: Multidetector CT imaging of the abdomen and pelvis was performed using the standard protocol following bolus administration of intravenous contrast. CONTRAST:  124mL OMNIPAQUE IOHEXOL 300 MG/ML  SOLN COMPARISON:  10/04/2008 FINDINGS: Lower chest: Right lower lobe dependent opacity consistent with atelectasis. Left lung base is clear. Hepatobiliary: Liver normal in size. Diffuse decreased liver attenuation consistent with fatty infiltration. No liver mass or focal lesion. Normal gallbladder. No bile duct dilation. Pancreas: Unremarkable. No pancreatic ductal dilatation or surrounding inflammatory changes. Spleen: Normal in size without focal abnormality. Adrenals/Urinary Tract: No adrenal masses. There is moderate right hydronephrosis, mild relative decrease in right renal enhancement and right perinephric stranding. The right ureter is dilated into the pelvis to the level of a 6 mm distal ureteral stone. No other ureteral stones. No renal masses. No intrarenal stones. No left intrarenal collecting system dilation. Normal left ureter. Bladder is unremarkable. Stomach/Bowel: Moderate distention of the stomach. Stomach otherwise unremarkable. Small bowel and colon are normal in caliber. No wall thickening. No inflammation. Previous appendectomy. Vascular/Lymphatic: Minor aortic atherosclerosis.  No aneurysm. No other vascular abnormality. No enlarged lymph nodes. Reproductive: Status post hysterectomy. No adnexal masses. Other: No abdominal wall hernia or abnormality. No abdominopelvic ascites. Musculoskeletal: No acute or significant osseous findings. IMPRESSION: 1. 6 mm distal right ureteral stone causes moderate right obstructive uropathy. 2. No other acute finding.  No intrarenal stones. 3. Diffuse hepatic  steatosis. 4. Status post hysterectomy and appendectomy. 5. Minor aortic atherosclerosis. Electronically Signed   By: Amie Portland M.D.   On: 06/27/2019 09:13   DG OR UROLOGY CYSTO IMAGE ([facility] ONLY)  Result Date: 06/27/2019 There is no interpretation for this exam.  This order is for images obtained during a surgical procedure.  Please See "Surgeries" Tab for more information regarding the procedure.    Assessment: Right ureteral calculus Sepsis secondary to urinary tract infection  Procedure(s): CYSTOSCOPY WITH STENT PLACEMENT, 1 Day Post-Op  doing well.  Plan: Agree with continuing broad-spectrum antibiotics until sensitivities return.  Anticipate she will be able to be discharged in a day or 2.  I will discontinue her Foley catheter.  I sent a message to the office to make follow-up in about 1 week or so.     Modena Slater, MD Urology 06/28/2019, 12:25 PM

## 2019-06-29 ENCOUNTER — Telehealth: Payer: Self-pay | Admitting: Physician Assistant

## 2019-06-29 DIAGNOSIS — R509 Fever, unspecified: Secondary | ICD-10-CM

## 2019-06-29 DIAGNOSIS — R1031 Right lower quadrant pain: Secondary | ICD-10-CM

## 2019-06-29 DIAGNOSIS — N390 Urinary tract infection, site not specified: Secondary | ICD-10-CM

## 2019-06-29 LAB — CBC
HCT: 31 % — ABNORMAL LOW (ref 36.0–46.0)
Hemoglobin: 10.5 g/dL — ABNORMAL LOW (ref 12.0–15.0)
MCH: 30.2 pg (ref 26.0–34.0)
MCHC: 33.9 g/dL (ref 30.0–36.0)
MCV: 89.1 fL (ref 80.0–100.0)
Platelets: 214 10*3/uL (ref 150–400)
RBC: 3.48 MIL/uL — ABNORMAL LOW (ref 3.87–5.11)
RDW: 12.9 % (ref 11.5–15.5)
WBC: 14.9 10*3/uL — ABNORMAL HIGH (ref 4.0–10.5)
nRBC: 0 % (ref 0.0–0.2)

## 2019-06-29 LAB — CULTURE, BLOOD (ROUTINE X 2)
Special Requests: ADEQUATE
Special Requests: ADEQUATE

## 2019-06-29 LAB — BASIC METABOLIC PANEL
Anion gap: 9 (ref 5–15)
BUN: 10 mg/dL (ref 6–20)
CO2: 26 mmol/L (ref 22–32)
Calcium: 8.2 mg/dL — ABNORMAL LOW (ref 8.9–10.3)
Chloride: 105 mmol/L (ref 98–111)
Creatinine, Ser: 0.75 mg/dL (ref 0.44–1.00)
GFR calc Af Amer: >60 mL/min (ref >60)
GFR calc non Af Amer: >60 mL/min (ref >60)
Glucose, Bld: 108 mg/dL — ABNORMAL HIGH (ref 70–99)
Potassium: 3.1 mmol/L — ABNORMAL LOW (ref 3.5–5.1)
Sodium: 140 mmol/L (ref 135–145)

## 2019-06-29 LAB — URINE CULTURE: Culture: 60000 — AB

## 2019-06-29 LAB — GLUCOSE, CAPILLARY
Glucose-Capillary: 97 mg/dL (ref 70–99)
Glucose-Capillary: 196 mg/dL — ABNORMAL HIGH (ref 70–99)

## 2019-06-29 LAB — MAGNESIUM: Magnesium: 2 mg/dL (ref 1.7–2.4)

## 2019-06-29 MED ORDER — POTASSIUM CHLORIDE CRYS ER 20 MEQ PO TBCR
40.0000 meq | EXTENDED_RELEASE_TABLET | Freq: Once | ORAL | Status: AC
  Administered 2019-06-29: 40 meq via ORAL
  Filled 2019-06-29: qty 2

## 2019-06-29 MED ORDER — SULFAMETHOXAZOLE-TRIMETHOPRIM 800-160 MG PO TABS: 1.0000 | ORAL_TABLET | Freq: Two times a day (BID) | ORAL | 0 refills | Status: AC

## 2019-06-29 NOTE — Progress Notes (Signed)
Pharmacy Antibiotic Note  Katie Neal is a 49 y.o. female admitted on 06/27/2019 with bacteremia and UTI.  Pharmacy has been consulted for Meropenem dosing.  Plan: Meropenem 1 gm IV Q8H  Height: 5\' 5"  (165.1 cm) Weight: 91.5 kg (201 lb 12.8 oz) IBW/kg (Calculated) : 57  Temp (24hrs), Avg:98.4 F (36.9 C), Min:98.1 F (36.7 C), Max:99.2 F (37.3 C)  Recent Labs  Lab 06/27/19 0209 06/27/19 0643 06/27/19 1915 06/27/19 2225 06/28/19 0537 06/29/19 0327  WBC 25.6*  --   --   --  20.6* 14.9*  CREATININE 0.98  --   --   --  1.02* 0.75  LATICACIDVEN 3.6* 2.3* 1.3 1.2  --   --     Estimated Creatinine Clearance: 96.1 mL/min (by C-G formula based on SCr of 0.75 mg/dL).    Allergies  Allergen Reactions  . Codeine Itching    Can take with benadryl      Microbiology results: 6/5 BCx: E.coli 6/5 UCx:  E.coli  Thank you for allowing pharmacy to be a part of this patient's care.  8/5, PharmD, BCPS 06/29/2019 7:46 AM

## 2019-06-29 NOTE — Telephone Encounter (Signed)
-----   Message from Crista Elliot, MD sent at 06/28/2019 12:27 PM EDT ----- Please arrange for follow-up with a provider in about 1 week for discussion of ureteroscopy and to set this up.  Thank you

## 2019-06-29 NOTE — Discharge Summary (Signed)
[place] at [facility]   PATIENT NAME: Katie Neal    MR#:  [id number]  DATE OF BIRTH:  December 10, 1970  DATE OF ADMISSION:  06/27/2019   ADMITTING PHYSICIAN: Lorretta Harp, MD  DATE OF DISCHARGE: 06/29/2019  5:04 PM  PRIMARY CARE PHYSICIAN: Rayetta Humphrey, MD   ADMISSION DIAGNOSIS:  Lower urinary tract infectious disease [N39.0] Right lower quadrant abdominal pain [R10.31] Fever, unspecified fever cause [R50.9] Sepsis, due to unspecified organism, unspecified whether acute organ dysfunction present [facility]) [A41.9] Acute pyelonephritis [N10] DISCHARGE DIAGNOSIS:  Principal Problem:   Right ureteral stone Active Problems:   CRPS (complex regional pain syndrome type I)   Asthma   Depression with anxiety   HLD (hyperlipidemia)   Hypothyroidism   Seizure (HCC)   Sepsis (HCC)   Acute pyelonephritis   Fever   Right lower quadrant abdominal pain  SECONDARY DIAGNOSIS:   Past Medical History:  Diagnosis Date  . Arthritis   . Asthma   . CRPS (complex regional pain syndrome type I)   . Depression   . IBS (irritable bowel syndrome)   . Insomnia   . PTSD (post-traumatic stress disorder)    HOSPITAL COURSE:  Katie Neal a 49 y.o.femalewith medical history ofhyperlipidemia, asthma, GERD, hypothyroidism, depression with anxiety, PTSD, IBS, CRPS (complex regional pain syndrome type I, involving internal organs including bladder), insomnia, seizure, who presented to the ED on 06/27/19 with RLQ abdominal pain, severe, constant, sharp in nature, with radiation to the right flank.  Associated nausea without vomiting, 2 episodes of diarrhea day before.  No UTI symptoms but reported feeling suprapubic fullness.  In the ED, febrile 100.9 F, tachycardic HR 118, BP 128/107 >> 143/80, otherwise normal vitals.  Labs notable for WBC 25.6k, lactic acidosis 3.6 >> 2.3, UA consistent with infection, K 3.4, normal renal function.  CT abdomen/pelvis showed right 6 mm of ureteral stone  with moderate obstructive uropathy. She underwent urgent right ureteral stent placement same day of admission.   Sepsis due to Acute Pyelonephritis Right ureteral stone  E. Coli Bacteremia -  Presented with leukocytosis, fever, tachycardia. Lactic acid 3.6 >> 2.3. Dr. Alvester Morin of urology performed urgent cystoscopy with rightretrograde pyelogram and rightureteral stentplacement on 6/5  - treated with IV Abx while in the Hospital - she will require outpatient definitive stone management in 2 to 3 weeks with ureteroscopy with laser lithotripsy and stent exchange. outpt Urology f/up  E.coli UTI - Total 14 Days course of Abx. Being D/C on PO Bactrim   CRPS (complex regional pain syndrome type I): Patient requests not to touch her without asking anywhere below shoulders due to severe pain.  Also has internal organ pain with this. --continue home pain meds regimen   Asthma - stable.   Depression and anxiety - Stable, no suicidal or homicidal ideations.  Continue home medications  HLD - continue Zocor  Hypothyroidism - continue Synthroid  Seizure disorder - Continue home Lamictal.  Patient BMI: Body mass index is 33.28 kg/m.   DISCHARGE CONDITIONS:  stable CONSULTS OBTAINED:  Treatment Team:  Crista Elliot, MD DRUG ALLERGIES:   Allergies  Allergen Reactions  . Codeine Itching    Can take with benadryl    DISCHARGE MEDICATIONS:   Allergies as of 06/29/2019      Reactions   Codeine Itching   Can take with benadryl      Medication List    STOP taking these medications   dextromethorphan-guaiFENesin 30-600 MG 12hr tablet Commonly  known as: MUCINEX DM   venlafaxine XR 75 MG 24 hr capsule Commonly known as: EFFEXOR-XR     TAKE these medications   amitriptyline 25 MG tablet Commonly known as: ELAVIL Take 25 mg by mouth at bedtime.   budesonide-formoterol 80-4.5 MCG/ACT inhaler Commonly known as: SYMBICORT Inhale 2 puffs into the lungs 2 (two) times  daily.   dicyclomine 20 MG tablet Commonly known as: BENTYL Take 20 mg by mouth 3 (three) times daily before meals.   fluticasone 50 MCG/ACT nasal spray Commonly known as: FLONASE Place 2 sprays into both nostrils daily.   Horizant 600 MG Tbcr Generic drug: Gabapentin Enacarbil Take 600 mg by mouth 2 (two) times daily.   hydrOXYzine 25 MG capsule Commonly known as: VISTARIL Take 25 mg by mouth 3 (three) times daily.   ipratropium 0.03 % nasal spray Commonly known as: ATROVENT Place 2 sprays into both nostrils every 12 (twelve) hours.   lamoTRIgine 100 MG tablet Commonly known as: LAMICTAL Take 100-200 mg by mouth See admin instructions. Take 1 tablet (100mg ) by mouth every morning and take 2 tablets (200mg ) by mouth at bedtime   levocetirizine 5 MG tablet Commonly known as: XYZAL SMARTSIG:1 Tablet(s) By Mouth Every Evening   levorphanol 2 MG tablet Commonly known as: LEVODROMORAN Take 2 mg by mouth every 8 (eight) hours.   levothyroxine 137 MCG tablet Commonly known as: SYNTHROID Take 137 mcg by mouth daily.   lidocaine 5 % Commonly known as: LIDODERM Place 1 patch onto the skin daily.   metoprolol succinate 25 MG 24 hr tablet Commonly known as: TOPROL-XL Take 25 mg by mouth daily.   montelukast 10 MG tablet Commonly known as: SINGULAIR Take 10 mg by mouth daily.   multivitamin with minerals Tabs tablet Take 1 tablet by mouth daily.   ondansetron 4 MG disintegrating tablet Commonly known as: ZOFRAN-ODT Take 4 mg by mouth every 8 (eight) hours as needed for nausea.   pantoprazole 40 MG tablet Commonly known as: PROTONIX Take 40 mg by mouth daily.   Pazeo 0.7 % Soln Generic drug: Olopatadine HCl Place 1 drop into both eyes 2 (two) times daily as needed (allergy symptoms).   QUEtiapine 25 MG tablet Commonly known as: SEROQUEL Take 25 mg by mouth at bedtime.   simvastatin 10 MG tablet Commonly known as: ZOCOR Take 10 mg by mouth daily.     sulfamethoxazole-trimethoprim 800-160 MG tablet Commonly known as: BACTRIM DS Take 1 tablet by mouth 2 (two) times daily for 12 days.   tiotropium 18 MCG inhalation capsule Commonly known as: SPIRIVA Place 18 mcg into inhaler and inhale daily.   tiZANidine 4 MG tablet Commonly known as: ZANAFLEX Take 4 mg by mouth every 8 (eight) hours as needed for muscle spasms.   traZODone 100 MG tablet Commonly known as: DESYREL Take 2 tablets (200 mg total) by mouth at bedtime. Needs office visit   vitamin C 500 MG tablet Commonly known as: ASCORBIC ACID Take 1,000 mg by mouth daily.      DISCHARGE INSTRUCTIONS:   DIET:  Regular diet DISCHARGE CONDITION:  Stable ACTIVITY:  Activity as tolerated OXYGEN:  Home Oxygen: No.  Oxygen Delivery: room air DISCHARGE LOCATION:  home   If you experience worsening of your admission symptoms, develop shortness of breath, life threatening emergency, suicidal or homicidal thoughts you must seek medical attention immediately by calling 911 or calling your MD immediately  if symptoms less severe.  You Must read complete instructions/literature along with  all the possible adverse reactions/side effects for all the Medicines you take and that have been prescribed to you. Take any new Medicines after you have completely understood and accpet all the possible adverse reactions/side effects.   Please note  You were cared for by a hospitalist during your hospital stay. If you have any questions about your discharge medications or the care you received while you were in the hospital after you are discharged, you can call the unit and asked to speak with the hospitalist on call if the hospitalist that took care of you is not available. Once you are discharged, your primary care physician will handle any further medical issues. Please note that NO REFILLS for any discharge medications will be authorized once you are discharged, as it is imperative that you  return to your primary care physician (or establish a relationship with a primary care physician if you do not have one) for your aftercare needs so that they can reassess your need for medications and monitor your lab values.    On the day of Discharge:  VITAL SIGNS:  Blood pressure (!) 146/91, pulse 91, temperature 98 F (36.7 C), resp. rate 19, height 5\' 5"  (1.651 m), weight 91.5 kg, SpO2 96 %. PHYSICAL EXAMINATION:  GENERAL:  49 y.o.-year-old patient lying in the bed with no acute distress.  EYES: Pupils equal, round, reactive to light and accommodation. No scleral icterus. Extraocular muscles intact.  HEENT: Head atraumatic, normocephalic. Oropharynx and nasopharynx clear.  NECK:  Supple, no jugular venous distention. No thyroid enlargement, no tenderness.  LUNGS: Normal breath sounds bilaterally, no wheezing, rales,rhonchi or crepitation. No use of accessory muscles of respiration.  CARDIOVASCULAR: S1, S2 normal. No murmurs, rubs, or gallops.  ABDOMEN: Soft, non-tender, non-distended. Bowel sounds present. No organomegaly or mass.  EXTREMITIES: No pedal edema, cyanosis, or clubbing.  NEUROLOGIC: Cranial nerves II through XII are intact. Muscle strength 5/5 in all extremities. Sensation intact. Gait not checked.  PSYCHIATRIC: The patient is alert and oriented x 3.  SKIN: No obvious rash, lesion, or ulcer.  DATA REVIEW:   CBC Recent Labs  Lab 06/29/19 0327  WBC 14.9*  HGB 10.5*  HCT 31.0*  PLT 214    Chemistries  Recent Labs  Lab 06/27/19 0209 06/28/19 0537 06/29/19 0327  NA 137   < > 140  K 3.4*   < > 3.1*  CL 101   < > 105  CO2 22   < > 26  GLUCOSE 176*   < > 108*  BUN 7   < > 10  CREATININE 0.98   < > 0.75  CALCIUM 9.0   < > 8.2*  MG  --   --  2.0  AST 31  --   --   ALT 26  --   --   ALKPHOS 125  --   --   BILITOT 1.1  --   --    < > = values in this interval not displayed.     Outpatient follow-up Follow-up Information    08/29/19, MD. Go on  07/08/2019.   Specialty: Family Medicine Why: Appointment at [facility] information: [address] [name] [phone]        [phone], PA-C. Go on 07/07/2019.   Specialty: Urology Why: Appointment at 2:15pm Contact information: [address] [name] [phone]            Management plans discussed with the patient, family  and they are in agreement.  CODE STATUS: Full Code   TOTAL TIME TAKING CARE OF THIS PATIENT: 45 minutes.    Delfino Lovett M.D on 06/29/2019 at 9:13 PM  [facility]   CC: Primary care physician; Rayetta Humphrey, MD   Note: This dictation was prepared with Dragon dictation along with smaller phrase technology. Any transcriptional errors that result from this process are unintentional.

## 2019-06-29 NOTE — Progress Notes (Addendum)
Urology Inpatient Progress Note  Subjective: Katie Neal is a 49 y.o. female admitted on 06/27/2019 with sepsis due to an obstructing 6 mm distal right ureteral stone, s/p right ureteral stent placement with Dr. Alvester Morin on 06/27/2019.   Creatinine down today, 0.75.  WBC count down today, 14.9.  Blood cultures positive for E. coli.  Urine cultures positive for E. coli, sensitivities pending.  On antibiotics as below.  She is afebrile, VSS.  Today, patient reports lower abdominal pressure, but believes she is emptying her bladder well.  She does report a history of urgency, frequency, and bladder spasms associated with her complex regional pain syndrome.  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  Anti-infectives: Anti-infectives (From admission, onward)   Start     Dose/Rate Route Frequency Ordered Stop   06/27/19 1700  meropenem (MERREM) 1 g in sodium chloride 0.9 % 100 mL IVPB     1 g 200 mL/hr over 30 Minutes Intravenous Every 8 hours 06/27/19 1654     06/27/19 1600  cefTRIAXone (ROCEPHIN) 2 g in sodium chloride 0.9 % 100 mL IVPB  Status:  Discontinued     2 g 200 mL/hr over 30 Minutes Intravenous Every 24 hours 06/27/19 1528 06/27/19 1656   06/27/19 0530  vancomycin (VANCOREADY) IVPB 1750 mg/350 mL     1,750 mg 175 mL/hr over 120 Minutes Intravenous  Once 06/27/19 0527 06/27/19 0829   06/27/19 0500  ceFEPIme (MAXIPIME) 2 g in sodium chloride 0.9 % 100 mL IVPB     2 g 200 mL/hr over 30 Minutes Intravenous  Once 06/27/19 0455 06/27/19 0638   06/27/19 0500  metroNIDAZOLE (FLAGYL) IVPB 500 mg     500 mg 100 mL/hr over 60 Minutes Intravenous  Once 06/27/19 0455 06/27/19 0907   06/27/19 0500  vancomycin (VANCOCIN) IVPB 1000 mg/200 mL premix  Status:  Discontinued     1,000 mg 200 mL/hr over 60 Minutes Intravenous  Once 06/27/19 0455 06/27/19 0527      Current Facility-Administered Medications  Medication Dose Route Frequency Provider Last Rate Last Admin   . 0.9 %  sodium chloride infusion   Intravenous Continuous Lorretta Harp, MD 75 mL/hr at 06/28/19 2042 New Bag at 06/28/19 2042  . acetaminophen (TYLENOL) tablet 650 mg  650 mg Oral Q6H PRN Lorretta Harp, MD      . albuterol (PROVENTIL) (2.5 MG/3ML) 0.083% nebulizer solution 3 mL  3 mL Inhalation Q4H PRN Lorretta Harp, MD      . amitriptyline (ELAVIL) tablet 25 mg  25 mg Oral QHS Lorretta Harp, MD   25 mg at 06/28/19 2119  . ascorbic acid (VITAMIN C) tablet 1,000 mg  1,000 mg Oral Daily Lorretta Harp, MD   1,000 mg at 06/28/19 1038  . Chlorhexidine Gluconate Cloth 2 % PADS 6 each  6 each Topical Q0600 Lorretta Harp, MD      . dextromethorphan-guaiFENesin [facility] DM) 30-600 MG per 12 hr tablet 1 tablet  1 tablet Oral BID PRN Lorretta Harp, MD      . dicyclomine (BENTYL) tablet 20 mg  20 mg Oral TID Langley Gauss, MD   20 mg at 06/29/19 0854  . fentaNYL (SUBLIMAZE) injection 25 mcg  25 mcg Intravenous Q2H PRN Esaw Grandchild A, DO   25 mcg at 06/28/19 1248  . fluticasone (FLONASE) 50 MCG/ACT nasal spray 2 spray  2 spray Each Nare Daily Lorretta Harp, MD   2 spray at 06/28/19 0913  .  gabapentin (NEURONTIN) capsule 600 mg  600 mg Oral BID Lorretta Harp, MD      . hydrOXYzine (ATARAX/VISTARIL) tablet 25 mg  25 mg Oral TID Lorretta Harp, MD   25 mg at 06/28/19 2119  . iohexol (OMNIPAQUE) 9 MG/ML oral solution 500 mL  500 mL Oral Once PRN Lorretta Harp, MD   500 mL at 06/27/19 0512  . ipratropium (ATROVENT) 0.03 % nasal spray 2 spray  2 spray Each Nare Q12H Lorretta Harp, MD   2 spray at 06/29/19 0202  . lamoTRIgine (LAMICTAL) tablet 100 mg  100 mg Oral q morning - 10a Lorretta Harp, MD   100 mg at 06/28/19 1038  . lamoTRIgine (LAMICTAL) tablet 200 mg  200 mg Oral QPM Lorretta Harp, MD   200 mg at 06/28/19 1811  . levorphanol (LEVODROMORAN) tablet 2 mg  2 mg Oral Q8H Lorretta Harp, MD   2 mg at 06/29/19 0620  . levothyroxine (SYNTHROID) tablet 137 mcg  137 mcg Oral Daily Lorretta Harp, MD   137 mcg at 06/29/19 0865  . loratadine (CLARITIN) tablet 10  mg  10 mg Oral QPM Lorretta Harp, MD   10 mg at 06/28/19 1812  . LORazepam (ATIVAN) injection 1 mg  1 mg Intravenous Q2H PRN Lorretta Harp, MD      . meropenem (MERREM) 1 g in sodium chloride 0.9 % 100 mL IVPB  1 g Intravenous Q8H Lorretta Harp, MD 200 mL/hr at 06/29/19 0623 1 g at 06/29/19 0623  . metoprolol succinate (TOPROL-XL) 24 hr tablet 25 mg  25 mg Oral Daily Lorretta Harp, MD   25 mg at 06/28/19 1038  . mometasone-formoterol (DULERA) 100-5 MCG/ACT inhaler 2 puff  2 puff Inhalation BID Lorretta Harp, MD   2 puff at 06/28/19 2120  . montelukast (SINGULAIR) tablet 10 mg  10 mg Oral Daily Lorretta Harp, MD   10 mg at 06/28/19 1038  . multivitamin with minerals tablet 1 tablet  1 tablet Oral Daily Lorretta Harp, MD   1 tablet at 06/28/19 1038  . olopatadine (PATANOL) 0.1 % ophthalmic solution 1 drop  1 drop Both Eyes BID PRN Lorretta Harp, MD      . ondansetron [facility]) injection 4 mg  4 mg Intravenous Q8H PRN Lorretta Harp, MD   4 mg at 06/27/19 1441  . oxyCODONE-acetaminophen (PERCOCET/ROXICET) 5-325 MG per tablet 1-2 tablet  1-2 tablet Oral Q4H PRN Esaw Grandchild A, DO   1 tablet at 06/29/19 0204  . pantoprazole (PROTONIX) EC tablet 40 mg  40 mg Oral Daily Lorretta Harp, MD   40 mg at 06/28/19 1038  . QUEtiapine (SEROQUEL) tablet 25 mg  25 mg Oral QHS Lorretta Harp, MD   25 mg at 06/28/19 2119  . simvastatin (ZOCOR) tablet 10 mg  10 mg Oral Daily Lorretta Harp, MD   10 mg at 06/28/19 1039  . tiotropium (SPIRIVA) inhalation capsule ([facility] use ONLY) 18 mcg  18 mcg Inhalation Daily Lorretta Harp, MD   18 mcg at 06/28/19 0913  . tiZANidine (ZANAFLEX) tablet 4 mg  4 mg Oral Q8H PRN Lorretta Harp, MD   4 mg at 06/28/19 1041  . traZODone (DESYREL) tablet 200 mg  200 mg Oral QHS Manuela Schwartz, NP   200 mg at 06/29/19 0035   Objective: Vital signs in last 24 hours: Temp:  [98.1 F (36.7 C)-99.2 F (37.3 C)] 98.1 F (36.7 C) (06/07 0730) Pulse Rate:  [85-95] 85 (06/07 0730) Resp:  [18-20] 18 (06/07 0730)  BP: (114-140)/(73-83) 140/83  (06/07 0730) SpO2:  [97 %-100 %] 100 % (06/07 0730) Weight:  [91.5 kg] 91.5 kg (06/07 0358)  Intake/Output from previous day: 06/06 0701 - 06/07 0700 In: 1839.4 [P.O.:240; I.V.:1399.4; IV Piggyback:200] Out: 2800 [Urine:2800] Intake/Output this shift: No intake/output data recorded.  Physical Exam Vitals reviewed.  Constitutional:      General: She is not in acute distress.    Appearance: She is not ill-appearing, toxic-appearing or diaphoretic.  HENT:     Head: Normocephalic and atraumatic.  Pulmonary:     Effort: Pulmonary effort is normal. No respiratory distress.  Skin:    General: Skin is warm and dry.  Neurological:     Mental Status: She is alert and oriented to person, place, and time.  Psychiatric:        Mood and Affect: Mood normal.        Behavior: Behavior normal.    Lab Results:  Recent Labs    06/28/19 0537 06/29/19 0327  WBC 20.6* 14.9*  HGB 10.8* 10.5*  HCT 31.0* 31.0*  PLT 200 214   BMET Recent Labs    06/28/19 0537 06/29/19 0327  NA 142 140  K 3.3* 3.1*  CL 107 105  CO2 27 26  GLUCOSE 141* 108*  BUN 13 10  CREATININE 1.02* 0.75  CALCIUM 8.7* 8.2*   PT/INR Recent Labs    06/27/19 1354  LABPROT 14.4  INR 1.2   Assessment & Plan: 49 year old female s/p right ureteral stent placement for management of urosepsis secondary to a 6 mm distal right ureteral stone.  Patient clinically improving on broad-spectrum antibiotics.    Given her history, unclear if lower abdominal pressure is consistent with complex regional pain syndrome versus stent discomfort versus recurrent urinary retention.  Recommend bladder scan today.  If her PVR is normal, may consider oxybutynin for management of likely stent discomfort.  I explained that she will require outpatient definitive stone management in 2 to 3 weeks with ureteroscopy with laser lithotripsy and stent exchange.  She is in agreement with this plan.  Of note, patient is under the care of Dr. Grace Blight  (cardiology) and Dr. Lanney Gins (pulmonology); may require clearance prior to surgery.  Recommendations: -Agree with empiric antibiotics, follow urine cultures and narrow as possible.  She will require a total of 14 days of antibiotic therapy -Bladder scan today to assess PVR -Already arranging outpatient follow-up with our practice  Debroah Loop, PA-C 06/29/2019

## 2019-06-29 NOTE — TOC Initial Note (Signed)
Transition of Care [facility]) - Initial/Assessment Note    Patient Details  Name: Katie Neal MRN: [id number] Date of Birth: 03/31/1970  Transition of Care [facility]) CM/SW Contact:    Katie Stanford, LCSW Phone Number: 06/29/2019, 3:52 PM  Clinical Narrative:  Pt states her Daughter in [place] takes her to her appointments. Pt states she is able to afford her medications and she [facility] [place]. Pt still sees Dr. Iona Beard, her PCP. No home needed at this time. Pt will d/c today.          Expected Discharge Plan: Home/Self Care Barriers to Discharge: No Barriers Identified   Patient Goals and CMS Choice        Expected Discharge Plan and Services Expected Discharge Plan: Home/Self Care In-house Referral: NA   Post Acute Care Choice: NA Living arrangements for the past 2 months: Single Family Home Expected Discharge Date: 06/29/19                 DME Agency: NA       HH Arranged: NA          Prior Living Arrangements/Services Living arrangements for the past 2 months: Single Family Home Lives with:: Self Patient language and need for interpreter reviewed:: Yes Do you feel safe going back to the place where you live?: Yes      Need for Family Participation in Patient Care: Yes (Comment) Care giver support system in place?: Yes (comment)   Criminal Activity/Legal Involvement Pertinent to Current Situation/Hospitalization: No - Comment as needed  Activities of Daily Living Home Assistive Devices/Equipment: None ADL Screening (condition at time of admission) Patient's cognitive ability adequate to safely complete daily activities?: Yes Is the patient deaf or have difficulty hearing?: No Does the patient have difficulty seeing, even when wearing glasses/contacts?: No Does the patient have difficulty concentrating, remembering, or making decisions?: No Patient able to express need for assistance with ADLs?: Yes Does the patient have difficulty dressing or bathing?:  No Independently performs ADLs?: Yes (appropriate for developmental age) Does the patient have difficulty walking or climbing stairs?: No Weakness of Legs: None Weakness of Arms/Hands: None  Permission Sought/Granted Permission sought to share information with : Family Supports    Share Information with NAME: Katie Neal     Permission granted to share info w Relationship: son     Emotional Assessment Appearance:: Appears stated age Attitude/Demeanor/Rapport: Engaged Affect (typically observed): Accepting, Appropriate Orientation: : Oriented to Situation, Oriented to  Time, Oriented to Place, Oriented to Self Alcohol / Substance Use: Not Applicable Psych Involvement: No (comment)  Admission diagnosis:  Lower urinary tract infectious disease [N39.0] Right lower quadrant abdominal pain [R10.31] Fever, unspecified fever cause [R50.9] Sepsis, due to unspecified organism, unspecified whether acute organ dysfunction present [facility]) [A41.9] Acute pyelonephritis [N10] Patient Active Problem List   Diagnosis Date Noted  . Fever   . Right lower quadrant abdominal pain   . Right ureteral stone 06/27/2019  . Sepsis ([place]) 06/27/2019  . Acute pyelonephritis 06/27/2019  . Weakness   . Seizure ([place]) 12/30/2018  . CRPS (complex regional pain syndrome type I)   . Asthma   . Depression with anxiety   . HLD (hyperlipidemia)   . Lower urinary tract infectious disease   . Fall   . Hypothyroidism   . CAP (community acquired pneumonia)    PCP:  Sharyne Peach, MD Pharmacy:   [id number] [place], [place] [name] [address] Phone: [phone] Fax: [phone]     Social Determinants of Health (SDOH) Interventions    Readmission Risk Interventions Readmission Risk Prevention Plan 06/29/2019  Transportation Screening Complete  PCP or Specialist Appt within 3-5 Days Complete  HRI or Home Care Consult Complete  Social Work Consult for Recovery Care  Planning/Counseling Complete  Palliative Care Screening Not Applicable  Medication Review [employer]) Complete  Some recent data might be hidden

## 2019-06-29 NOTE — Progress Notes (Signed)
Pt complains of feeling short of breath with ambulation. Lungs show fine crackles to bases. No acute distress noted. Oxygen saturation is 98% on room air. RN will give prn albuterol neb. I will continue to assess.

## 2019-06-29 NOTE — Telephone Encounter (Signed)
App made 

## 2019-06-29 NOTE — Progress Notes (Signed)
PA for urology requested post voidal bladder scan. Pt voided 250 and post void scanned showed 51ml in bladder. I will continue to assess.

## 2019-07-07 ENCOUNTER — Other Ambulatory Visit: Payer: Self-pay

## 2019-07-07 ENCOUNTER — Encounter: Payer: Self-pay | Admitting: Physician Assistant

## 2019-07-07 ENCOUNTER — Other Ambulatory Visit: Payer: Self-pay | Admitting: Radiology

## 2019-07-07 ENCOUNTER — Ambulatory Visit (INDEPENDENT_AMBULATORY_CARE_PROVIDER_SITE_OTHER): Payer: Medicare Other | Admitting: Physician Assistant

## 2019-07-07 VITALS — BP 155/75 | HR 99 | Ht 65.0 in | Wt 198.0 lb

## 2019-07-07 DIAGNOSIS — N201 Calculus of ureter: Secondary | ICD-10-CM

## 2019-07-07 MED ORDER — OXYBUTYNIN CHLORIDE 5 MG PO TABS: 5.0000 mg | ORAL_TABLET | Freq: Three times a day (TID) | ORAL | 0 refills | Status: DC | PRN

## 2019-07-07 NOTE — H&P (View-Only) (Signed)
07/07/2019 3:49 PM   Katie Neal [birth year] [id number]  CC: Chief Complaint  Patient presents with  . Follow-up    HPI: Katie Neal is a 49 y.o. female with PMH CRPS and recent hospitalization due to pansensitive E. coli sepsis associated with a 6 mm distal right ureteral stone s/p right ureteral stent placement with Dr. Alvester Morin on 06/27/2019 who presents today to discuss definitive stone management.  Reproductive status: Hysterectomy  Asthma/COPD overlap with chronic nocturnal hypoxemia managed by Dr. Karna Christmas; patient previously seen by Dr. Harlen Labs for management of sinus tachycardia but appears to have been released from care in 2018 with recommendations to follow-up with PCP. On metoprolol for chronic tachycardia.  Today, patient reports persistent lower abdominal pressure following stent placement. She is looking forward to proceeding with definitive stone management ASAP. She has 3 days left of Bactrim. No stone history. Additionally, she requests ketamine and Vitamin C with anesthesia due to her history of CRPS.  PMH: Past Medical History:  Diagnosis Date  . Arthritis   . Asthma   . CRPS (complex regional pain syndrome type I)   . Depression   . IBS (irritable bowel syndrome)   . Insomnia   . PTSD (post-traumatic stress disorder)     Surgical History: Past Surgical History:  Procedure Laterality Date  . ABDOMINAL HYSTERECTOMY    . ACHILLES TENDON REPAIR  1982  . APPENDECTOMY    . CYSTOSCOPY WITH STENT PLACEMENT Right 06/27/2019   Procedure: CYSTOSCOPY WITH STENT PLACEMENT;  Surgeon: Crista Elliot, MD;  Location: [facility] ORS;  Service: Urology;  Laterality: Right;  . FOOT SURGERY  2002   due to deformity  . HIP SURGERY  2012  . KNEE ARTHROSCOPY    . LAPAROSCOPIC HYSTERECTOMY  2000  . REPAIR TENDONS LEG     x2    Home Medications:  Allergies as of 07/07/2019      Reactions   Codeine Itching   Can take with benadryl      Medication List        Accurate as of July 07, 2019  3:49 PM. If you have any questions, ask your nurse or doctor.        amitriptyline 25 MG tablet Commonly known as: ELAVIL Take 25 mg by mouth at bedtime.   budesonide-formoterol 80-4.5 MCG/ACT inhaler Commonly known as: SYMBICORT Inhale 2 puffs into the lungs 2 (two) times daily.   dicyclomine 20 MG tablet Commonly known as: BENTYL Take 20 mg by mouth 3 (three) times daily before meals.   fluticasone 50 MCG/ACT nasal spray Commonly known as: FLONASE Place 2 sprays into both nostrils daily.   Horizant 600 MG Tbcr Generic drug: Gabapentin Enacarbil Take 600 mg by mouth 2 (two) times daily.   hydrOXYzine 25 MG capsule Commonly known as: VISTARIL Take 25 mg by mouth 3 (three) times daily.   ipratropium 0.03 % nasal spray Commonly known as: ATROVENT Place 2 sprays into both nostrils every 12 (twelve) hours.   lamoTRIgine 100 MG tablet Commonly known as: LAMICTAL Take 100-200 mg by mouth See admin instructions. Take 1 tablet (100mg ) by mouth every morning and take 2 tablets (200mg ) by mouth at bedtime   levocetirizine 5 MG tablet Commonly known as: XYZAL SMARTSIG:1 Tablet(s) By Mouth Every Evening   levorphanol 2 MG tablet Commonly known as: LEVODROMORAN Take 2 mg by mouth every 8 (eight) hours.   levothyroxine 137 MCG tablet Commonly known as: SYNTHROID Take 137 mcg by mouth  daily.   lidocaine 5 % Commonly known as: LIDODERM Place 1 patch onto the skin daily.   metoprolol succinate 25 MG 24 hr tablet Commonly known as: TOPROL-XL Take 25 mg by mouth daily.   montelukast 10 MG tablet Commonly known as: SINGULAIR Take 10 mg by mouth daily.   multivitamin with minerals Tabs tablet Take 1 tablet by mouth daily.   ondansetron 4 MG disintegrating tablet Commonly known as: ZOFRAN-ODT Take 4 mg by mouth every 8 (eight) hours as needed for nausea.   oxybutynin 5 MG tablet Commonly known as: DITROPAN Take 1 tablet (5 mg total) by  mouth every 8 (eight) hours as needed for bladder spasms. Started by: Carman Ching, PA-C   pantoprazole 40 MG tablet Commonly known as: PROTONIX Take 40 mg by mouth daily.   Pazeo 0.7 % Soln Generic drug: Olopatadine HCl Place 1 drop into both eyes 2 (two) times daily as needed (allergy symptoms).   QUEtiapine 25 MG tablet Commonly known as: SEROQUEL Take 25 mg by mouth at bedtime.   simvastatin 10 MG tablet Commonly known as: ZOCOR Take 10 mg by mouth daily.   sulfamethoxazole-trimethoprim 800-160 MG tablet Commonly known as: BACTRIM DS Take 1 tablet by mouth 2 (two) times daily for 12 days.   tiotropium 18 MCG inhalation capsule Commonly known as: SPIRIVA Place 18 mcg into inhaler and inhale daily.   tiZANidine 4 MG tablet Commonly known as: ZANAFLEX Take 4 mg by mouth every 8 (eight) hours as needed for muscle spasms.   traZODone 100 MG tablet Commonly known as: DESYREL Take 2 tablets (200 mg total) by mouth at bedtime. Needs office visit   vitamin C 500 MG tablet Commonly known as: ASCORBIC ACID Take 1,000 mg by mouth daily.       Allergies:  Allergies  Allergen Reactions  . Codeine Itching    Can take with benadryl     Family History: Family History  Problem Relation Age of Onset  . Diabetes Mellitus II Father   . Kidney cancer Maternal Grandmother     Social History:   reports that she has quit smoking. Her smoking use included cigarettes. She has never used smokeless tobacco. She reports that she does not drink alcohol and does not use drugs.  Physical Exam: BP (!) 155/75   Pulse 99   Ht 5\' 5"  (1.651 m)   Wt 198 lb (89.8 kg)   BMI 32.95 kg/m   Constitutional:  Alert and oriented, no acute distress, nontoxic appearing HEENT: [place], AT Cardiovascular: No clubbing, cyanosis, or edema Respiratory: Normal respiratory effort, no increased work of breathing Skin: No rashes, bruises or suspicious lesions Neurologic: Grossly intact, no focal  deficits, moving all 4 extremities Psychiatric: Normal mood and affect  Assessment & Plan:   1. Right ureteral stone Based on her recent episode of urosepsis, I recommend definitive stone management in the form of URS/LL/stent exchange. Patient is in agreement with this plan. Will obtain pulmonology clearance from Dr. ; cardiac clearance not needed.  We discussed the risks and benefits of ureteroscopy with laser lithotripsy and stent exchange including bleeding, infection, damage to surrounding structures, efficacy with need for possible further intervention, and need for temporary ureteral stent.  Starting patient on oxybutynin for management of bladder pressure/spasm 2/2 stent.  Patient unable to provide urine sample today; will provide urine drop off tomorrow AM. Sterile urine cup provided today. - oxybutynin (DITROPAN) 5 MG tablet; Take 1 tablet (5 mg total) by mouth every  8 (eight) hours as needed for bladder spasms.  Dispense: 30 tablet; Refill: 0 - Urinalysis, Complete; Future - CULTURE, URINE COMPREHENSIVE; Future   Return in about 1 day (around 07/08/2019) for Drop off preop UA and culture.  Debroah Loop, PA-C  [facility] [address] [place], [place] [address] [phone]

## 2019-07-07 NOTE — Patient Instructions (Signed)
You must drop off a urine sample for analysis and culture tomorrow (07/08/19).

## 2019-07-07 NOTE — Progress Notes (Signed)
07/07/2019 3:49 PM   Katie Neal [birth year] [id number]  CC: Chief Complaint  Patient presents with  . Follow-up    HPI: Katie Neal is a 49 y.o. female with PMH CRPS and recent hospitalization due to pansensitive E. coli sepsis associated with a 6 mm distal right ureteral stone s/p right ureteral stent placement with Dr. Alvester Morin on 06/27/2019 who presents today to discuss definitive stone management.  Reproductive status: Hysterectomy  Asthma/COPD overlap with chronic nocturnal hypoxemia managed by Dr. Karna Christmas; patient previously seen by Dr. Harlen Labs for management of sinus tachycardia but appears to have been released from care in 2018 with recommendations to follow-up with PCP. On metoprolol for chronic tachycardia.  Today, patient reports persistent lower abdominal pressure following stent placement. She is looking forward to proceeding with definitive stone management ASAP. She has 3 days left of Bactrim. No stone history. Additionally, she requests ketamine and Vitamin C with anesthesia due to her history of CRPS.  PMH: Past Medical History:  Diagnosis Date  . Arthritis   . Asthma   . CRPS (complex regional pain syndrome type I)   . Depression   . IBS (irritable bowel syndrome)   . Insomnia   . PTSD (post-traumatic stress disorder)     Surgical History: Past Surgical History:  Procedure Laterality Date  . ABDOMINAL HYSTERECTOMY    . ACHILLES TENDON REPAIR  1982  . APPENDECTOMY    . CYSTOSCOPY WITH STENT PLACEMENT Right 06/27/2019   Procedure: CYSTOSCOPY WITH STENT PLACEMENT;  Surgeon: Crista Elliot, MD;  Location: [facility] ORS;  Service: Urology;  Laterality: Right;  . FOOT SURGERY  2002   due to deformity  . HIP SURGERY  2012  . KNEE ARTHROSCOPY    . LAPAROSCOPIC HYSTERECTOMY  2000  . REPAIR TENDONS LEG     x2    Home Medications:  Allergies as of 07/07/2019      Reactions   Codeine Itching   Can take with benadryl      Medication List        Accurate as of July 07, 2019  3:49 PM. If you have any questions, ask your nurse or doctor.        amitriptyline 25 MG tablet Commonly known as: ELAVIL Take 25 mg by mouth at bedtime.   budesonide-formoterol 80-4.5 MCG/ACT inhaler Commonly known as: SYMBICORT Inhale 2 puffs into the lungs 2 (two) times daily.   dicyclomine 20 MG tablet Commonly known as: BENTYL Take 20 mg by mouth 3 (three) times daily before meals.   fluticasone 50 MCG/ACT nasal spray Commonly known as: FLONASE Place 2 sprays into both nostrils daily.   Horizant 600 MG Tbcr Generic drug: Gabapentin Enacarbil Take 600 mg by mouth 2 (two) times daily.   hydrOXYzine 25 MG capsule Commonly known as: VISTARIL Take 25 mg by mouth 3 (three) times daily.   ipratropium 0.03 % nasal spray Commonly known as: ATROVENT Place 2 sprays into both nostrils every 12 (twelve) hours.   lamoTRIgine 100 MG tablet Commonly known as: LAMICTAL Take 100-200 mg by mouth See admin instructions. Take 1 tablet (100mg ) by mouth every morning and take 2 tablets (200mg ) by mouth at bedtime   levocetirizine 5 MG tablet Commonly known as: XYZAL SMARTSIG:1 Tablet(s) By Mouth Every Evening   levorphanol 2 MG tablet Commonly known as: LEVODROMORAN Take 2 mg by mouth every 8 (eight) hours.   levothyroxine 137 MCG tablet Commonly known as: SYNTHROID Take 137 mcg by mouth  daily.   lidocaine 5 % Commonly known as: LIDODERM Place 1 patch onto the skin daily.   metoprolol succinate 25 MG 24 hr tablet Commonly known as: TOPROL-XL Take 25 mg by mouth daily.   montelukast 10 MG tablet Commonly known as: SINGULAIR Take 10 mg by mouth daily.   multivitamin with minerals Tabs tablet Take 1 tablet by mouth daily.   ondansetron 4 MG disintegrating tablet Commonly known as: ZOFRAN-ODT Take 4 mg by mouth every 8 (eight) hours as needed for nausea.   oxybutynin 5 MG tablet Commonly known as: DITROPAN Take 1 tablet (5 mg total) by  mouth every 8 (eight) hours as needed for bladder spasms. Started by: Luther Newhouse, PA-C   pantoprazole 40 MG tablet Commonly known as: PROTONIX Take 40 mg by mouth daily.   Pazeo 0.7 % Soln Generic drug: Olopatadine HCl Place 1 drop into both eyes 2 (two) times daily as needed (allergy symptoms).   QUEtiapine 25 MG tablet Commonly known as: SEROQUEL Take 25 mg by mouth at bedtime.   simvastatin 10 MG tablet Commonly known as: ZOCOR Take 10 mg by mouth daily.   sulfamethoxazole-trimethoprim 800-160 MG tablet Commonly known as: BACTRIM DS Take 1 tablet by mouth 2 (two) times daily for 12 days.   tiotropium 18 MCG inhalation capsule Commonly known as: SPIRIVA Place 18 mcg into inhaler and inhale daily.   tiZANidine 4 MG tablet Commonly known as: ZANAFLEX Take 4 mg by mouth every 8 (eight) hours as needed for muscle spasms.   traZODone 100 MG tablet Commonly known as: DESYREL Take 2 tablets (200 mg total) by mouth at bedtime. Needs office visit   vitamin C 500 MG tablet Commonly known as: ASCORBIC ACID Take 1,000 mg by mouth daily.       Allergies:  Allergies  Allergen Reactions  . Codeine Itching    Can take with benadryl     Family History: Family History  Problem Relation Age of Onset  . Diabetes Mellitus II Father   . Kidney cancer Maternal Grandmother     Social History:   reports that she has quit smoking. Her smoking use included cigarettes. She has never used smokeless tobacco. She reports that she does not drink alcohol and does not use drugs.  Physical Exam: BP (!) 155/75   Pulse 99   Ht 5' 5" (1.651 m)   Wt 198 lb (89.8 kg)   BMI 32.95 kg/m   Constitutional:  Alert and oriented, no acute distress, nontoxic appearing HEENT: [place], AT Cardiovascular: No clubbing, cyanosis, or edema Respiratory: Normal respiratory effort, no increased work of breathing Skin: No rashes, bruises or suspicious lesions Neurologic: Grossly intact, no focal  deficits, moving all 4 extremities Psychiatric: Normal mood and affect  Assessment & Plan:   1. Right ureteral stone Based on her recent episode of urosepsis, I recommend definitive stone management in the form of URS/LL/stent exchange. Patient is in agreement with this plan. Will obtain pulmonology clearance from Dr. Aleskerov; cardiac clearance not needed.  We discussed the risks and benefits of ureteroscopy with laser lithotripsy and stent exchange including bleeding, infection, damage to surrounding structures, efficacy with need for possible further intervention, and need for temporary ureteral stent.  Starting patient on oxybutynin for management of bladder pressure/spasm 2/2 stent.  Patient unable to provide urine sample today; will provide urine drop off tomorrow AM. Sterile urine cup provided today. - oxybutynin (DITROPAN) 5 MG tablet; Take 1 tablet (5 mg total) by mouth every   8 (eight) hours as needed for bladder spasms.  Dispense: 30 tablet; Refill: 0 - Urinalysis, Complete; Future - CULTURE, URINE COMPREHENSIVE; Future   Return in about 1 day (around 07/08/2019) for Drop off preop UA and culture.  Katie Loop, PA-C  [facility] [address] [place], [place] [address] [phone]

## 2019-07-08 ENCOUNTER — Other Ambulatory Visit: Payer: Self-pay

## 2019-07-09 ENCOUNTER — Telehealth: Payer: Self-pay | Admitting: *Deleted

## 2019-07-09 NOTE — Telephone Encounter (Signed)
Patient called Triage line having some bladder pain. She has not been taking Oxybutynin. Aware to start taking every 8 hours as needed for bladder spasms and pain. Advised to go to ER if pain worsens or fever, chills or body aches.

## 2019-07-10 ENCOUNTER — Other Ambulatory Visit
Admission: RE | Admit: 2019-07-10 | Discharge: 2019-07-10 | Disposition: A | Discharge status: Home / Self Care | Payer: Medicare Other | Source: Ambulatory Visit | Attending: Urology | Admitting: Urology

## 2019-07-10 ENCOUNTER — Other Ambulatory Visit: Payer: Self-pay

## 2019-07-10 HISTORY — DX: Sleep apnea, unspecified: G47.30

## 2019-07-10 HISTORY — DX: Headache, unspecified: R51.9

## 2019-07-10 HISTORY — DX: Chronic obstructive pulmonary disease, unspecified: J44.9

## 2019-07-10 HISTORY — DX: Personal history of urinary calculi: Z87.442

## 2019-07-10 NOTE — Patient Instructions (Signed)
Your procedure is scheduled on: Tuesday July 14, 2019 Report to Day Surgery. To find out your arrival time please call [phone] between 1PM - 3PM on Monday July 13, 2019.  Remember: Instructions that are not followed completely may result in serious medical risk,  up to and including death, or upon the discretion of your surgeon and anesthesiologist your  surgery may need to be rescheduled.     _X__ 1. Do not eat food after midnight the night before your procedure.                 No gum chewing or hard candies. You may drink clear liquids up to 2 hours                 before you are scheduled to arrive for your surgery- DO not drink clear                 liquids within 2 hours of the start of your surgery.                 Clear Liquids include:  water, apple juice without pulp, clear Gatorade, G2 or                  Gatorade Zero (avoid Red/Purple/Blue), Black Coffee or Tea (Do not add                 anything to coffee or tea).  __X__2.  On the morning of surgery brush your teeth with toothpaste and water, you                may rinse your mouth with mouthwash if you wish.  Do not swallow any toothpaste of mouthwash.     _X__ 3.  No Alcohol for 24 hours before or after surgery.   _X__ 4.  Do Not Smoke or use e-cigarettes For 24 Hours Prior to Your Surgery.                 Do not use any chewable tobacco products for at least 6 hours prior to                 Surgery.  _X__  5.  Do not use any recreational drugs (marijuana, cocaine, heroin, ecstacy, MDMA or other)                For at least one week prior to your surgery.  Combination of these drugs with anesthesia                May have life threatening results.  __X__  7.  Notify your doctor if there is any change in your medical condition      (cold, fever, infections).     Do not wear jewelry, make-up, hairpins, clips or nail polish. Do not wear lotions, powders, or perfumes. You may wear  deodorant. Do not shave 48 hours prior to surgery. Men may shave face and neck. Do not bring valuables to the hospital.    [facility] is not responsible for any belongings or valuables.  Contacts, dentures or bridgework may not be worn into surgery. Leave your suitcase in the car. After surgery it may be brought to your room. For patients admitted to the hospital, discharge time is determined by your treatment team.   Patients discharged the day of surgery will not be allowed to drive home.   Make arrangements for someone to be with you for the  first 24 hours of your Same Day Discharge.   _x___ Take these medicines the morning of surgery with A SIP OF WATER:    1. levorphanol (LEVODROMORAN)  2. levothyroxine (SYNTHROID, LEVOTHROID) 137 MCG tablet  3. metoprolol succinate (TOPROL-XL) 25 MG  4. Gabapentin Enacarbil (HORIZANT) 600 MG   5. metoprolol succinate (TOPROL-XL) 25 MG  6. oxybutynin (DITROPAN) 5 MG  7. pantoprazole (PROTONIX) 40 MG  8. tiotropium (SPIRIVA) 18 MCG inhalation  9. tiZANidine (ZANAFLEX) 4 MG  10. lamoTRIgine (LAMICTAL) 100 MG   11. Venlafaxine    _x___ Use inhalers on the day of surgery  budesonide-formoterol (SYMBICORT) 80-4.5 MCG/ACT inhaler  fluticasone (FLONASE) 50 MCG/ACT nasal spray  ipratropium (ATROVENT) 0.03 % nasal spray  __X__ Stop Anti-inflammatories such as ibuprofen, Aleve, naproxen, aspirin and or BC powders.    __X__ Stop supplements until after surgery.    __X__  Do not start any herbal supplements before your surgery.

## 2019-07-13 ENCOUNTER — Other Ambulatory Visit
Admission: RE | Admit: 2019-07-13 | Discharge: 2019-07-13 | Disposition: A | Discharge status: Home / Self Care | Payer: Medicare Other | Source: Ambulatory Visit | Attending: Urology | Admitting: Urology

## 2019-07-13 ENCOUNTER — Other Ambulatory Visit: Payer: Self-pay

## 2019-07-13 ENCOUNTER — Telehealth: Payer: Self-pay | Admitting: *Deleted

## 2019-07-13 DIAGNOSIS — Z01812 Encounter for preprocedural laboratory examination: Secondary | ICD-10-CM | POA: Insufficient documentation

## 2019-07-13 DIAGNOSIS — Z20822 Contact with and (suspected) exposure to covid-19: Secondary | ICD-10-CM | POA: Insufficient documentation

## 2019-07-13 LAB — URINALYSIS, COMPLETE
Bilirubin, UA: NEGATIVE
Glucose, UA: NEGATIVE
Ketones, UA: NEGATIVE
Nitrite, UA: NEGATIVE
Specific Gravity, UA: 1.02 (ref 1.005–1.030)
Urobilinogen, Ur: 0.2 mg/dL (ref 0.2–1.0)
pH, UA: 6.5 (ref 5.0–7.5)

## 2019-07-13 LAB — MICROSCOPIC EXAMINATION
Bacteria, UA: NONE SEEN
RBC, Urine: >30 /HPF — AB (ref 0–2)

## 2019-07-13 LAB — POTASSIUM: Potassium: 3.5 mmol/L (ref 3.5–5.1)

## 2019-07-13 NOTE — Telephone Encounter (Signed)
Patient states she has been vomiting about 4 times last night and taking Zofran . She has had diarrhea for two day. She has been taking oxybutynin for bladder spasms, not helping .Also she states she is bleeding like a period but not a period. She did go this mooring for her covid test.

## 2019-07-13 NOTE — Telephone Encounter (Signed)
I have not seen this patient, Sam just scheduled her surgery with me.  Sounds like she does need to go to the ED

## 2019-07-13 NOTE — Telephone Encounter (Signed)
Notified patient as instructed,.  

## 2019-07-14 ENCOUNTER — Ambulatory Visit: Payer: Medicare Other

## 2019-07-14 ENCOUNTER — Encounter
Admission: RE | Disposition: A | Discharge status: Home / Self Care | Payer: Self-pay | Source: Home / Self Care | Attending: Urology

## 2019-07-14 ENCOUNTER — Encounter: Payer: Self-pay | Admitting: Urology

## 2019-07-14 ENCOUNTER — Ambulatory Visit
Admission: RE | Admit: 2019-07-14 | Discharge: 2019-07-14 | Disposition: A | Discharge status: Home / Self Care | Payer: Medicare Other | Attending: Urology | Admitting: Urology

## 2019-07-14 ENCOUNTER — Ambulatory Visit: Payer: Medicare Other | Admitting: Anesthesiology

## 2019-07-14 ENCOUNTER — Other Ambulatory Visit: Payer: Self-pay

## 2019-07-14 DIAGNOSIS — G4700 Insomnia, unspecified: Secondary | ICD-10-CM | POA: Insufficient documentation

## 2019-07-14 DIAGNOSIS — Z79890 Hormone replacement therapy (postmenopausal): Secondary | ICD-10-CM | POA: Diagnosis not present

## 2019-07-14 DIAGNOSIS — E039 Hypothyroidism, unspecified: Secondary | ICD-10-CM | POA: Diagnosis not present

## 2019-07-14 DIAGNOSIS — G4730 Sleep apnea, unspecified: Secondary | ICD-10-CM | POA: Diagnosis not present

## 2019-07-14 DIAGNOSIS — J449 Chronic obstructive pulmonary disease, unspecified: Secondary | ICD-10-CM | POA: Diagnosis not present

## 2019-07-14 DIAGNOSIS — G9050 Complex regional pain syndrome I, unspecified: Secondary | ICD-10-CM | POA: Diagnosis not present

## 2019-07-14 DIAGNOSIS — Z79899 Other long term (current) drug therapy: Secondary | ICD-10-CM | POA: Insufficient documentation

## 2019-07-14 DIAGNOSIS — N201 Calculus of ureter: Secondary | ICD-10-CM | POA: Diagnosis present

## 2019-07-14 DIAGNOSIS — Z87891 Personal history of nicotine dependence: Secondary | ICD-10-CM | POA: Diagnosis not present

## 2019-07-14 DIAGNOSIS — F329 Major depressive disorder, single episode, unspecified: Secondary | ICD-10-CM | POA: Diagnosis not present

## 2019-07-14 DIAGNOSIS — R000 Tachycardia, unspecified: Secondary | ICD-10-CM | POA: Insufficient documentation

## 2019-07-14 DIAGNOSIS — K589 Irritable bowel syndrome without diarrhea: Secondary | ICD-10-CM | POA: Diagnosis not present

## 2019-07-14 DIAGNOSIS — F419 Anxiety disorder, unspecified: Secondary | ICD-10-CM | POA: Insufficient documentation

## 2019-07-14 HISTORY — PX: CYSTOSCOPY/RETROGRADE/URETEROSCOPY: SHX5316

## 2019-07-14 HISTORY — PX: CYSTOSCOPY/URETEROSCOPY/HOLMIUM LASER/STENT PLACEMENT: SHX6546

## 2019-07-14 LAB — SARS CORONAVIRUS 2 (TAT 6-24 HRS): SARS Coronavirus 2: NEGATIVE

## 2019-07-14 SURGERY — CYSTOSCOPY/URETEROSCOPY/HOLMIUM LASER/STENT PLACEMENT
Anesthesia: General | Laterality: Right

## 2019-07-14 MED ORDER — ORAL CARE MOUTH RINSE: 15.0000 mL | Freq: Once | OROMUCOSAL | Status: AC

## 2019-07-14 MED ORDER — PROPOFOL 10 MG/ML IV BOLUS
INTRAVENOUS | Status: AC
  Filled 2019-07-14: qty 20

## 2019-07-14 MED ORDER — MIDAZOLAM HCL 2 MG/2ML IJ SOLN
INTRAMUSCULAR | Status: DC | PRN
  Administered 2019-07-14: 2 mg via INTRAVENOUS

## 2019-07-14 MED ORDER — FENTANYL CITRATE (PF) 100 MCG/2ML IJ SOLN
INTRAMUSCULAR | Status: DC | PRN
  Administered 2019-07-14: 100 ug via INTRAVENOUS

## 2019-07-14 MED ORDER — PROPOFOL 10 MG/ML IV BOLUS
INTRAVENOUS | Status: DC | PRN
  Administered 2019-07-14: 30 mg via INTRAVENOUS
  Administered 2019-07-14: 170 mg via INTRAVENOUS

## 2019-07-14 MED ORDER — ORAL CARE MOUTH RINSE: 15.0000 mL | Freq: Once | OROMUCOSAL | Status: DC

## 2019-07-14 MED ORDER — KETAMINE HCL 50 MG/ML IJ SOLN
INTRAMUSCULAR | Status: AC
  Filled 2019-07-14: qty 10

## 2019-07-14 MED ORDER — LACTATED RINGERS IV SOLN
INTRAVENOUS | Status: DC
  Administered 2019-07-14: 50 mL/h via INTRAVENOUS

## 2019-07-14 MED ORDER — ACETAMINOPHEN 10 MG/ML IV SOLN
INTRAVENOUS | Status: DC | PRN
  Administered 2019-07-14: 1000 mg via INTRAVENOUS

## 2019-07-14 MED ORDER — CHLORHEXIDINE GLUCONATE 0.12 % MT SOLN: 15.0000 mL | Freq: Once | OROMUCOSAL | Status: DC

## 2019-07-14 MED ORDER — ONDANSETRON HCL 4 MG/2ML IJ SOLN: 4.0000 mg | Freq: Once | INTRAMUSCULAR | Status: DC | PRN

## 2019-07-14 MED ORDER — URIBEL 118 MG PO CAPS
1.0000 | ORAL_CAPSULE | Freq: Three times a day (TID) | ORAL | 0 refills | Status: DC | PRN
Start: 2019-07-14 — End: 2019-08-11

## 2019-07-14 MED ORDER — ACETAMINOPHEN 10 MG/ML IV SOLN
INTRAVENOUS | Status: AC
  Filled 2019-07-14: qty 100

## 2019-07-14 MED ORDER — DEXAMETHASONE SODIUM PHOSPHATE 10 MG/ML IJ SOLN
INTRAMUSCULAR | Status: DC | PRN
  Administered 2019-07-14: 10 mg via INTRAVENOUS

## 2019-07-14 MED ORDER — CHLORHEXIDINE GLUCONATE 0.12 % MT SOLN
OROMUCOSAL | Status: AC
  Filled 2019-07-14: qty 15

## 2019-07-14 MED ORDER — PHENYLEPHRINE HCL (PRESSORS) 10 MG/ML IV SOLN
INTRAVENOUS | Status: DC | PRN
  Administered 2019-07-14 (×2): 100 ug via INTRAVENOUS
  Administered 2019-07-14: 200 ug via INTRAVENOUS
  Administered 2019-07-14: 100 ug via INTRAVENOUS

## 2019-07-14 MED ORDER — ONDANSETRON HCL 4 MG/2ML IJ SOLN
INTRAMUSCULAR | Status: DC | PRN
  Administered 2019-07-14: 4 mg via INTRAVENOUS

## 2019-07-14 MED ORDER — MIDAZOLAM HCL 2 MG/2ML IJ SOLN
INTRAMUSCULAR | Status: AC
  Filled 2019-07-14: qty 2

## 2019-07-14 MED ORDER — FENTANYL CITRATE (PF) 100 MCG/2ML IJ SOLN
INTRAMUSCULAR | Status: AC
  Filled 2019-07-14: qty 2

## 2019-07-14 MED ORDER — IOPAMIDOL (ISOVUE-200) INJECTION 41%
INTRAVENOUS | Status: DC | PRN
  Administered 2019-07-14: 15 mL via INTRAVENOUS

## 2019-07-14 MED ORDER — CEFAZOLIN SODIUM-DEXTROSE 2-4 GM/100ML-% IV SOLN
2.0000 g | INTRAVENOUS | Status: AC
  Administered 2019-07-14: 2 g via INTRAVENOUS

## 2019-07-14 MED ORDER — CEFAZOLIN SODIUM-DEXTROSE 2-4 GM/100ML-% IV SOLN
INTRAVENOUS | Status: AC
  Filled 2019-07-14: qty 100

## 2019-07-14 MED ORDER — EPHEDRINE SULFATE 50 MG/ML IJ SOLN
INTRAMUSCULAR | Status: DC | PRN
  Administered 2019-07-14 (×2): 5 mg via INTRAVENOUS

## 2019-07-14 MED ORDER — FENTANYL CITRATE (PF) 100 MCG/2ML IJ SOLN: 25.0000 ug | INTRAMUSCULAR | Status: DC | PRN

## 2019-07-14 MED ORDER — SUCCINYLCHOLINE CHLORIDE 20 MG/ML IJ SOLN
INTRAMUSCULAR | Status: DC | PRN
  Administered 2019-07-14: 50 mg via INTRAVENOUS

## 2019-07-14 MED ORDER — KETAMINE HCL 10 MG/ML IJ SOLN
INTRAMUSCULAR | Status: DC | PRN
Start: 2019-07-14 — End: 2019-07-14
  Administered 2019-07-14: 30 mg via INTRAVENOUS

## 2019-07-14 MED ORDER — CHLORHEXIDINE GLUCONATE 0.12 % MT SOLN
15.0000 mL | Freq: Once | OROMUCOSAL | Status: AC
  Administered 2019-07-14: 15 mL via OROMUCOSAL

## 2019-07-14 MED ORDER — LIDOCAINE HCL (CARDIAC) PF 100 MG/5ML IV SOSY
PREFILLED_SYRINGE | INTRAVENOUS | Status: DC | PRN
  Administered 2019-07-14: 100 mg via INTRAVENOUS

## 2019-07-14 SURGICAL SUPPLY — 30 items
BAG DRAIN CYSTO-URO LG1000N (MISCELLANEOUS) ×4 IMPLANT
BASKET ZERO TIP 1.9FR (BASKET) ×4 IMPLANT
BRUSH SCRUB EZ 1% IODOPHOR (MISCELLANEOUS) ×4 IMPLANT
CATH URETL 5X70 OPEN END (CATHETERS) IMPLANT
CNTNR SPEC 2.5X3XGRAD LEK (MISCELLANEOUS) ×2
CONT SPEC 4OZ STER OR WHT (MISCELLANEOUS) ×2
CONT SPEC 4OZ STRL OR WHT (MISCELLANEOUS) ×2
CONTAINER SPEC 2.5X3XGRAD LEK (MISCELLANEOUS) ×2 IMPLANT
DRAPE UTILITY 15X26 TOWEL STRL (DRAPES) ×4 IMPLANT
FIBER LASER TRACTIP 200 (UROLOGICAL SUPPLIES) ×4 IMPLANT
GLOVE BIOGEL PI IND STRL 7.5 (GLOVE) ×2 IMPLANT
GLOVE BIOGEL PI INDICATOR 7.5 (GLOVE) ×2
GOWN STRL REUS W/ TWL LRG LVL3 (GOWN DISPOSABLE) ×2 IMPLANT
GOWN STRL REUS W/ TWL XL LVL3 (GOWN DISPOSABLE) ×2 IMPLANT
GOWN STRL REUS W/TWL LRG LVL3 (GOWN DISPOSABLE) ×4
GOWN STRL REUS W/TWL XL LVL3 (GOWN DISPOSABLE) ×4
GUIDEWIRE STR DUAL SENSOR (WIRE) ×4 IMPLANT
INFUSOR MANOMETER BAG 3000ML (MISCELLANEOUS) ×4 IMPLANT
INTRODUCER DILATOR DOUBLE (INTRODUCER) IMPLANT
KIT TURNOVER CYSTO (KITS) ×4 IMPLANT
PACK CYSTO AR (MISCELLANEOUS) ×4 IMPLANT
SET CYSTO W/LG BORE CLAMP LF (SET/KITS/TRAYS/PACK) ×4 IMPLANT
SHEATH URETERAL 12FRX35CM (MISCELLANEOUS) IMPLANT
SOL .9 NS 3000ML IRR  AL (IV SOLUTION) ×4
SOL .9 NS 3000ML IRR UROMATIC (IV SOLUTION) ×2 IMPLANT
STENT URET 6FRX24 CONTOUR (STENTS) ×4 IMPLANT
STENT URET 6FRX26 CONTOUR (STENTS) IMPLANT
SURGILUBE 2OZ TUBE FLIPTOP (MISCELLANEOUS) ×4 IMPLANT
VALVE UROSEAL ADJ ENDO (VALVE) IMPLANT
WATER STERILE IRR 1000ML POUR (IV SOLUTION) ×4 IMPLANT

## 2019-07-14 NOTE — Transfer of Care (Signed)
Immediate Anesthesia Transfer of Care Note  Patient: Katie Neal  Procedure(s) Performed: CYSTOSCOPY/URETEROSCOPY/HOLMIUM LASER/STENT Exchange (Right ) CYSTOSCOPY/RETROGRADE/URETEROSCOPY  Patient Location: PACU  Anesthesia Type:General  Level of Consciousness: alert  and drowsy  Airway & Oxygen Therapy: Patient connected to face mask oxygen  Post-op Assessment: Report given to RN  Post vital signs: stable  Last Vitals:  Vitals Value Taken Time  BP 130/89 07/14/19 1102  Temp    Pulse 94 07/14/19 1105  Resp 15 07/14/19 1105  SpO2 94 % 07/14/19 1105  Vitals shown include unvalidated device data.  Last Pain:  Vitals:   07/14/19 0915  TempSrc: Tympanic  PainSc: 0-No pain         Complications: No complications documented.

## 2019-07-14 NOTE — Op Note (Signed)
Preoperative diagnosis: Right distal ureteral calculus  Postoperative diagnosis: Right distal ureteral calculus  Procedure:  1. Cystoscopy 2. Right ureteroscopy and stone removal 3. Ureteroscopic laser lithotripsy 4. Right ureteral stent exchange (6FR) 24 cm 5. Right retrograde pyelography with interpretation  Surgeon: Lorin Picket C. Maebry Obrien, M.D.  Anesthesia: General  Complications: None  Intraoperative findings:  1.  Right retrograde pyelography post procedure showed no filling defects, stone fragments or contrast extravasation  EBL: Minimal  Specimens: 1. Calculus fragments for analysis   Indication: Katie Neal is a 49 y.o. recently hospitalized for pansensitive E. coli sepsis associated with a 6 mm obstructing distal ureteral calculus who underwent urgent stent placement by Dr. Alvester Morin on 06/27/2019.  She presents today for definitive stone treatment.  After reviewing the management options for treatment, the patient elected to proceed with the above surgical procedure(s). We have discussed the potential benefits and risks of the procedure, side effects of the proposed treatment, the likelihood of the patient achieving the goals of the procedure, and any potential problems that might occur during the procedure or recuperation. Informed consent has been obtained.  Description of procedure:  The patient was taken to the operating room and general anesthesia was induced.  The patient was placed in the dorsal lithotomy position, prepped and draped in the usual sterile fashion, and preoperative antibiotics were administered. A preoperative time-out was performed.   A 21 French cystoscope was lubricated and passed per urethra. Panendoscopy was performed and the bladder mucosa showed no erythema, solid or papillary lesions with the exception of inflammatory changes of the right hemitrigone secondary to her indwelling stent.  The stent was grasped with endoscopic forceps and brought out  to the urethral meatus.  A 0.038 Sensor wire was then placed through the stent and advanced in the renal pelvis under fluoroscopic guidance without difficulty.  The ureteral stent was then removed.  A 4.5 Fr semirigid ureteroscope was then advanced into the ureter next to the guidewire and the calculus was identified in the midportion of the distal ureter.  The stone was then fragmented with a 200 micron holmium laser fiber on a setting of 0.2 J and frequency of 20 hz.   All fragments were then removed from the ureter with a zero tip nitinol basket.  Reinspection of the ureter revealed no remaining visible stones or fragments.   Retrograde pyelogram was performed with findings as described above.  A 6 FR/24 CM Contour stent was placed under fluoroscopic guidance.  The wire was then removed with an adequate stent curl noted in the renal pelvis as well as in the bladder.  The bladder was then emptied and the procedure ended.  The patient appeared to tolerate the procedure well and without complications.  After anesthetic reversal the patient was transported to the PACU in stable condition.   Plan: The stent was left attached to a tether and she was instructed to remove on Thursday, 07/16/2019.   Katie Axon, MD

## 2019-07-14 NOTE — Interval H&P Note (Signed)
History and Physical Interval Note: The procedure was discussed in detail.  All questions were answered and she desires to proceed. Lungs: Clear CV: RRR  07/14/2019 9:50 AM  Katie Neal  has presented today for surgery, with the diagnosis of right ureteral stone.  The various methods of treatment have been discussed with the patient and family. After consideration of risks, benefits and other options for treatment, the patient has consented to  Procedure(s): CYSTOSCOPY/URETEROSCOPY/HOLMIUM LASER/STENT Exchange (Right) as a surgical intervention.  The patient's history has been reviewed, patient examined, no change in status, stable for surgery.  I have reviewed the patient's chart and labs.  Questions were answered to the patient's satisfaction.     Katie Neal C Katie Neal

## 2019-07-14 NOTE — Discharge Instructions (Signed)
DISCHARGE INSTRUCTIONS FOR KIDNEY STONE/URETERAL STENT   MEDICATIONS:  1. Resume all your other meds from home.  2.  Uribel can help with the burning/stinging, frequency, urgency when you urinate.  Rx was sent to pharmacy   ACTIVITY:  1. May resume regular activities in 24 hours. 2. No driving while on narcotic pain medications  3. Drink plenty of water  4. Continue to walk at home - you can still get blood clots when you are at home, so keep active, but don't over do it.  5. May return to work/school tomorrow or when you feel ready   BATHING:  1. You can shower. 2. You have a string coming from your urethra: The stent string is attached to your ureteral stent. Do not pull on this.   SIGNS/SYMPTOMS TO CALL:  Please call us if you have a fever greater than 101.5, uncontrolled nausea/vomiting, uncontrolled pain, dizziness, unable to urinate, excessively bloody urine, chest pain, shortness of breath, leg swelling, leg pain, or any other concerns or questions.   Common postoperative symptoms include frequency, urgency, bladder spasm, burning with urination and blood in the urine  You can reach [place] at [phone].   FOLLOW-UP:  1. You we will be contacted regarding postop follow-up 2. You have a string attached to your stent, you may remove it on Thursday 6/24. To do this, pull the string until the stent is completely removed. You may feel an odd sensation in your back.  If you are not comfortable removing the stent then please contact her office in a time will be arranged for you to come in to have one of our clinical staff remove.   AMBULATORY SURGERY  DISCHARGE INSTRUCTIONS   1) The drugs that you were given will stay in your system until tomorrow so for the next 24 hours you should not:  A) Drive an automobile B) Make any legal decisions C) Drink any alcoholic beverage   2) You may resume regular meals tomorrow.  Today it is better to start with liquids and gradually work up  to solid foods.  You may eat anything you prefer, but it is better to start with liquids, then soup and crackers, and gradually work up to solid foods.   3) Please notify your doctor immediately if you have any unusual bleeding, trouble breathing, redness and pain at the surgery site, drainage, fever, or pain not relieved by medication.    4) Additional Instructions:        Please contact your physician with any problems or Same Day Surgery at [phone], Monday through Friday 6 am to 4 pm, or [place] at [facility] number at [phone].

## 2019-07-14 NOTE — Anesthesia Procedure Notes (Signed)
Procedure Name: Intubation Date/Time: 07/14/2019 10:14 AM Performed by: Rodney Booze, CRNA Pre-anesthesia Checklist: Patient identified, Emergency Drugs available, Suction available, Patient being monitored and Timeout performed Patient Re-evaluated:Patient Re-evaluated prior to induction Oxygen Delivery Method: Circle system utilized Preoxygenation: Pre-oxygenation with 100% oxygen Induction Type: IV induction Ventilation: Mask ventilation without difficulty Laryngoscope Size: Miller and 2 Grade View: Grade I Tube size: 7.0 mm Number of attempts: 1 Airway Equipment and Method: Rigid stylet Placement Confirmation: ETT inserted through vocal cords under direct vision,  positive ETCO2 and breath sounds checked- equal and bilateral Secured at: 19 cm Tube secured with: Tape Dental Injury: Teeth and Oropharynx as per pre-operative assessment

## 2019-07-14 NOTE — Anesthesia Preprocedure Evaluation (Signed)
Anesthesia Evaluation  Patient identified by MRN, date of birth, ID band Patient awake    Reviewed: Allergy & Precautions, NPO status , Patient's Chart, lab work & pertinent test results  History of Anesthesia Complications Negative for: history of anesthetic complications  Airway Mallampati: II       Dental   Pulmonary asthma , sleep apnea and Continuous Positive Airway Pressure Ventilation , COPD,  COPD inhaler, former smoker,           Cardiovascular (-) hypertension(-) Past MI and (-) CHF (-) dysrhythmias (-) Valvular Problems/Murmurs     Neuro/Psych Seizures -,  Anxiety Depression  Neuromuscular disease (CRPS)    GI/Hepatic Neg liver ROS, neg GERD  ,  Endo/Other  neg diabetesHypothyroidism   Renal/GU      Musculoskeletal   Abdominal   Peds  Hematology   Anesthesia Other Findings   Reproductive/Obstetrics                             Anesthesia Physical Anesthesia Plan  ASA: III  Anesthesia Plan: General   Post-op Pain Management:    Induction: Intravenous  PONV Risk Score and Plan: 3 and Ondansetron, Dexamethasone and Treatment may vary due to age or medical condition  Airway Management Planned: Oral ETT  Additional Equipment:   Intra-op Plan:   Post-operative Plan:   Informed Consent: I have reviewed the patients History and Physical, chart, labs and discussed the procedure including the risks, benefits and alternatives for the proposed anesthesia with the patient or authorized representative who has indicated his/her understanding and acceptance.       Plan Discussed with:   Anesthesia Plan Comments:         Anesthesia Quick Evaluation

## 2019-07-15 ENCOUNTER — Encounter: Payer: Self-pay | Admitting: Urology

## 2019-07-15 LAB — CULTURE, URINE COMPREHENSIVE

## 2019-07-15 NOTE — Anesthesia Postprocedure Evaluation (Signed)
Anesthesia Post Note  Patient: Katie Neal  Procedure(s) Performed: CYSTOSCOPY/URETEROSCOPY/HOLMIUM LASER/STENT Exchange (Right ) CYSTOSCOPY/RETROGRADE/URETEROSCOPY  Patient location during evaluation: PACU Anesthesia Type: General Level of consciousness: awake and alert Pain management: pain level controlled Vital Signs Assessment: post-procedure vital signs reviewed and stable Respiratory status: spontaneous breathing and respiratory function stable Cardiovascular status: stable Anesthetic complications: no   No complications documented.   Last Vitals:  Vitals:   07/14/19 1133 07/14/19 1159  BP: 112/77 (!) 150/84  Pulse: 95 (!) 101  Resp: (!) 21 20  Temp:  36.5 C  SpO2: 95% 95%    Last Pain:  Vitals:   07/14/19 1159  TempSrc: Temporal  PainSc: 0-No pain                 Charlis Harner K

## 2019-07-16 ENCOUNTER — Telehealth: Payer: Self-pay | Admitting: Physician Assistant

## 2019-07-16 MED ORDER — DOXYCYCLINE HYCLATE 100 MG PO CAPS
100.0000 mg | ORAL_CAPSULE | Freq: Two times a day (BID) | ORAL | 0 refills | Status: AC
Start: 2019-07-16 — End: 2019-07-23

## 2019-07-16 NOTE — Telephone Encounter (Signed)
Please contact the patient and inform her that I would like to start her on antibiotics based on her recent urine culture results.  I have sent doxycycline 100 mg twice daily x1 week to the [facility] [place].  I would like her to start these ASAP.

## 2019-07-16 NOTE — Telephone Encounter (Signed)
Notified patient as advised. Patient verbalized understanding and agreed to start her ABX today.

## 2019-07-23 LAB — CALCULI, WITH PHOTOGRAPH (CLINICAL LAB)
Calcium Oxalate Dihydrate: 30 %
Calcium Oxalate Monohydrate: 60 %
Hydroxyapatite: 10 %
Weight Calculi: 28 mg

## 2019-08-07 ENCOUNTER — Other Ambulatory Visit: Payer: Self-pay | Admitting: Physician Assistant

## 2019-08-07 ENCOUNTER — Ambulatory Visit (INDEPENDENT_AMBULATORY_CARE_PROVIDER_SITE_OTHER): Payer: Medicare Other | Admitting: Physician Assistant

## 2019-08-07 ENCOUNTER — Encounter: Payer: Self-pay | Admitting: Physician Assistant

## 2019-08-07 ENCOUNTER — Other Ambulatory Visit: Payer: Self-pay

## 2019-08-07 VITALS — BP 111/74 | HR 92 | Ht 65.0 in | Wt 198.0 lb

## 2019-08-07 DIAGNOSIS — R300 Dysuria: Secondary | ICD-10-CM | POA: Diagnosis not present

## 2019-08-07 DIAGNOSIS — N201 Calculus of ureter: Secondary | ICD-10-CM

## 2019-08-07 DIAGNOSIS — R3989 Other symptoms and signs involving the genitourinary system: Secondary | ICD-10-CM | POA: Diagnosis not present

## 2019-08-07 DIAGNOSIS — K589 Irritable bowel syndrome without diarrhea: Secondary | ICD-10-CM | POA: Insufficient documentation

## 2019-08-07 LAB — BLADDER SCAN AMB NON-IMAGING

## 2019-08-07 MED ORDER — NITROFURANTOIN MONOHYD MACRO 100 MG PO CAPS: 100.0000 mg | ORAL_CAPSULE | Freq: Two times a day (BID) | ORAL | 0 refills | Status: DC

## 2019-08-07 NOTE — Progress Notes (Signed)
In and Out Catheterization  Patient is present today for a I & O catheterization due to urinary retention. Patient was cleaned and prepped in a sterile fashion with betadine . A 14FR cath was inserted no complications were noted , of urine return was noted, urine was green in color. Bladder was drained and catheter was removed without difficulty.    Performed by: Carman Ching, PA-C and Debbe Bales, CMA

## 2019-08-07 NOTE — Progress Notes (Signed)
08/07/2019 3:35 PM   Katie Neal [birth year] [id number]  CC: Chief Complaint  Patient presents with  . Dysuria    HPI: Katie Neal is a 49 y.o. 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 Lonna Cobb on 07/14/2019 who presents today for evaluation of possible UTI.   Today she reports a 1 day history of sudden onset right pelvic pain that has since spread to her left pelvis.  She reports a history of left-sided bladder pain with her CRPS, however right-sided pain is less common for her.  She has had some dysuria, nausea, and vomiting associated with this.  She denies fever, chills, and gross hematuria.  She has been taking oxybutynin and Uribel for management of dysuria and bladder spasms since taking out her right ureteral stent last month on POD 3.  She is s/p hysterectomy.  In-office UA today positive for 3+ blood and trace leukocyte esterase; urine microscopy with 11-30 RBCs/HPF. PVR .  PMH: Past Medical History:  Diagnosis Date  . Arthritis   . Asthma   . COPD (chronic obstructive pulmonary disease) (HCC)   . CRPS (complex regional pain syndrome type I)   . Depression   . Headache   . History of kidney stones   . IBS (irritable bowel syndrome)   . Insomnia   . PTSD (post-traumatic stress disorder)   . Sleep apnea    not on Cpap machine but is on oxygen     Surgical History: Past Surgical History:  Procedure Laterality Date  . ABDOMINAL HYSTERECTOMY    . ACHILLES TENDON REPAIR  1982  . APPENDECTOMY    . CYSTOSCOPY WITH STENT PLACEMENT Right 06/27/2019   Procedure: CYSTOSCOPY WITH STENT PLACEMENT;  Surgeon: Crista Elliot, MD;  Location: [facility] ORS;  Service: Urology;  Laterality: Right;  . CYSTOSCOPY/RETROGRADE/URETEROSCOPY  07/14/2019   Procedure: CYSTOSCOPY/RETROGRADE/URETEROSCOPY;  Surgeon: Riki Altes, MD;  Location: [facility] ORS;  Service: Urology;;  . CYSTOSCOPY/URETEROSCOPY/HOLMIUM LASER/STENT PLACEMENT Right 07/14/2019   Procedure: CYSTOSCOPY/URETEROSCOPY/HOLMIUM LASER/STENT Exchange;  Surgeon: Riki Altes, MD;  Location: [facility] ORS;  Service: Urology;  Laterality: Right;  . FOOT SURGERY  2002   due to deformity  . HIP SURGERY  2012  . KNEE ARTHROSCOPY    . LAPAROSCOPIC HYSTERECTOMY  2000  . REPAIR TENDONS LEG     x2    Home Medications:  Allergies as of 08/07/2019      Reactions   Codeine Itching   Can take with benadryl      Medication List       Accurate as of August 07, 2019  3:35 PM. If you have any questions, ask your nurse or doctor.        STOP taking these medications   oxybutynin 5 MG tablet Commonly known as: DITROPAN Stopped by: Carman Ching, PA-C     TAKE these medications   amitriptyline 25 MG tablet Commonly known as: ELAVIL Take 25 mg by mouth at bedtime.   budesonide-formoterol 80-4.5 MCG/ACT inhaler Commonly known as: SYMBICORT Inhale 2 puffs into the lungs 2 (two) times daily.   dicyclomine 20 MG tablet Commonly known as: BENTYL Take 20 mg by mouth 3 (three) times daily before meals.   fluticasone 50 MCG/ACT nasal spray Commonly known as: FLONASE Place 2 sprays into both nostrils daily.   Horizant 600 MG Tbcr Generic drug: Gabapentin Enacarbil Take  600 mg by mouth 2 (two) times daily.   hydrOXYzine 25 MG capsule Commonly known as: VISTARIL Take 25 mg by mouth 3 (three) times daily.   ipratropium 0.03 % nasal spray Commonly known as: ATROVENT Place 2 sprays into both nostrils every 12 (twelve) hours.   ketorolac 10 MG tablet Commonly known as: TORADOL Take by mouth.   lamoTRIgine 100 MG tablet Commonly known as: LAMICTAL Take 100-200 mg by mouth See admin instructions. Take 1 tablet (100mg ) by mouth every morning and take 2 tablets (200mg ) by mouth at bedtime   levocetirizine 5 MG tablet Commonly known as:  XYZAL SMARTSIG:1 Tablet(s) By Mouth Every Evening   levorphanol 2 MG tablet Commonly known as: LEVODROMORAN Take 2 mg by mouth every 8 (eight) hours.   levothyroxine 137 MCG tablet Commonly known as: SYNTHROID Take 137 mcg by mouth daily.   lidocaine 5 % Commonly known as: LIDODERM Place 1 patch onto the skin daily.   metoprolol succinate 25 MG 24 hr tablet Commonly known as: TOPROL-XL Take 25 mg by mouth daily.   montelukast 10 MG tablet Commonly known as: SINGULAIR Take 10 mg by mouth daily.   multivitamin with minerals Tabs tablet Take 1 tablet by mouth daily.   ondansetron 4 MG disintegrating tablet Commonly known as: ZOFRAN-ODT Take 4 mg by mouth every 8 (eight) hours as needed for nausea.   pantoprazole 40 MG tablet Commonly known as: PROTONIX Take 40 mg by mouth daily.   Pazeo 0.7 % Soln Generic drug: Olopatadine HCl Place 1 drop into both eyes 2 (two) times daily as needed (allergy symptoms).   QUEtiapine 25 MG tablet Commonly known as: SEROQUEL Take 25 mg by mouth at bedtime.   simvastatin 10 MG tablet Commonly known as: ZOCOR Take 10 mg by mouth daily.   tiotropium 18 MCG inhalation capsule Commonly known as: SPIRIVA Place 18 mcg into inhaler and inhale daily.   tiZANidine 4 MG tablet Commonly known as: ZANAFLEX Take 4 mg by mouth every 8 (eight) hours as needed for muscle spasms.   traZODone 100 MG tablet Commonly known as: DESYREL Take 2 tablets (200 mg total) by mouth at bedtime. Needs office visit   Uribel 118 MG Caps Take 1 capsule (118 mg total) by mouth 3 (three) times daily as needed (Urinary frequency, urgency, burning).   venlafaxine 50 MG tablet Commonly known as: EFFEXOR Take 50 mg by mouth 2 (two) times daily.   vitamin C 500 MG tablet Commonly known as: ASCORBIC ACID Take 1,000 mg by mouth daily.       Allergies:  Allergies  Allergen Reactions  . Codeine Itching    Can take with benadryl     Family  History: Family History  Problem Relation Age of Onset  . Diabetes Mellitus II Father   . Kidney cancer Maternal Grandmother     Social History:   reports that she quit smoking about 5 years ago. Her smoking use included cigarettes. She has never used smokeless tobacco. She reports that she does not drink alcohol and does not use drugs.  Physical Exam: BP 111/74 (BP Location: Left Wrist, Patient Position: Sitting, Cuff Size: Normal)   Pulse 92   Ht 5\' 5"  (1.651 m)   Wt 198 lb (89.8 kg)   BMI 32.95 kg/m   Constitutional:  Alert and oriented, no acute distress, nontoxic appearing HEENT: [place], AT Cardiovascular: No clubbing, cyanosis, or edema Respiratory: Normal respiratory effort, no increased work of breathing Skin: No rashes, bruises  or suspicious lesions Neurologic: Grossly intact, no focal deficits, moving all 4 extremities Psychiatric: Normal mood and affect  Laboratory Data: Results for orders placed or performed in visit on 08/07/19  CULTURE, URINE COMPREHENSIVE   Specimen: Urine   UR  Result Value Ref Range   Urine Culture, Comprehensive Preliminary report    Organism ID, Bacteria Comment   Microscopic Examination   Urine  Result Value Ref Range   WBC, UA 0-5 0 - 5 /hpf   RBC 11-30 (A) 0 - 2 /hpf   Epithelial Cells (non renal) 0-10 0 - 10 /hpf   Renal Epithel, UA 0-10 (A) None seen /hpf   Bacteria, UA None seen None seen/Few  Urinalysis, Complete  Result Value Ref Range   Specific Gravity, UA 1.015 1.005 - 1.030   pH, UA 6.5 5.0 - 7.5   Color, UA Green (A) Yellow   Appearance Ur Cloudy (A) Clear   Leukocytes,UA Trace (A) Negative   Protein,UA Negative Negative/Trace   Glucose, UA Negative Negative   Ketones, UA Negative Negative   RBC, UA 3+ (A) Negative   Bilirubin, UA Negative Negative   Urobilinogen, Ur 0.2 0.2 - 1.0 mg/dL   Nitrite, UA Negative Negative   Microscopic Examination See below:   Bladder Scan (Post Void Residual) in office  Result Value  Ref Range   Scan Result    Assessment & Plan:   1. Bladder pain 49 year old female with PMH CRPS with bladder involvement and Foley dependent urinary retention in 2017 presents with a 1 day history of severe bladder pain in the setting of recent staged ureteroscopy for management of a 6 mm distal right ureteral stone with E. coli urosepsis.  PVR noted to be significantly elevated today, UA notable only for microscopic hematuria.  Patient has been taking oxybutynin and Uribel.  I explained that I suspect her bladder pain is secondary to distention in the setting of urinary retention.  We will stop oxybutynin at this time.  I offered her Foley catheter placement versus [facility] teaching versus in and out catheterization in clinic today with close follow-up.  Patient elected for in and out catheterization, I am in agreement with this plan.  See separate procedure note for details.  We will send urine for culture to rule out contributory infection, though I suspect her current presentation is due to anticholinergic use.  We will plan for repeat PVR in 3 days.  Counseled patient to proceed to the emergency room over the weekend if she develops recurrent bladder pain or the inability to urinate.  She expressed understanding. - Urinalysis, Complete - CULTURE, URINE COMPREHENSIVE - Bladder Scan (Post Void Residual) in office   Return in about 3 days (around 08/10/2019) for Repeat PVR.  Carman Ching, PA-C  [facility] [address], [address] [place], [name] [address] [phone]

## 2019-08-07 NOTE — Patient Instructions (Signed)
Stop oxybutynin. Do not start Macrobid. I will see you back on Monday to rescan your bladder.  If you develop worse pain or the inability to urinate over the weekend, go to the Emergency Department.

## 2019-08-08 LAB — URINALYSIS, COMPLETE
Bilirubin, UA: NEGATIVE
Glucose, UA: NEGATIVE
Ketones, UA: NEGATIVE
Nitrite, UA: NEGATIVE
Protein,UA: NEGATIVE
Specific Gravity, UA: 1.015 (ref 1.005–1.030)
Urobilinogen, Ur: 0.2 mg/dL (ref 0.2–1.0)
pH, UA: 6.5 (ref 5.0–7.5)

## 2019-08-08 LAB — MICROSCOPIC EXAMINATION: Bacteria, UA: NONE SEEN

## 2019-08-09 ENCOUNTER — Other Ambulatory Visit: Payer: Self-pay | Admitting: Urology

## 2019-08-10 ENCOUNTER — Ambulatory Visit (INDEPENDENT_AMBULATORY_CARE_PROVIDER_SITE_OTHER): Payer: Medicare Other | Admitting: Physician Assistant

## 2019-08-10 ENCOUNTER — Encounter: Payer: Self-pay | Admitting: Physician Assistant

## 2019-08-10 ENCOUNTER — Other Ambulatory Visit: Payer: Self-pay

## 2019-08-10 VITALS — BP 136/77 | HR 80 | Ht 65.0 in | Wt 198.0 lb

## 2019-08-10 DIAGNOSIS — R3989 Other symptoms and signs involving the genitourinary system: Secondary | ICD-10-CM

## 2019-08-10 DIAGNOSIS — N201 Calculus of ureter: Secondary | ICD-10-CM | POA: Diagnosis not present

## 2019-08-10 DIAGNOSIS — R3129 Other microscopic hematuria: Secondary | ICD-10-CM

## 2019-08-10 LAB — BLADDER SCAN AMB NON-IMAGING: Scan Result: 3

## 2019-08-10 NOTE — Progress Notes (Signed)
08/10/2019 1:51 PM   Katie Neal July 11, 1970 [id number]  CC: Chief Complaint  Patient presents with  . Follow-up    HPI: Katie Neal is a 49 y.o. 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 Lonna Cobb on 07/14/2019. I last saw her in clinic three days ago with reports of severe bladder pain. She was found to have incomplete bladder emptying in the setting of anticholinergic use requiring in and out catheterization. She presents today for follow-up PVR.  Today, she reports improvement in bladder pain since I&O catheterization 3 days ago. She has stopped anticholinergics. No acute concerns.  PVR 46mL.  PMH: Past Medical History:  Diagnosis Date  . Arthritis   . Asthma   . COPD (chronic obstructive pulmonary disease) (HCC)   . CRPS (complex regional pain syndrome type I)   . Depression   . Headache   . History of kidney stones   . IBS (irritable bowel syndrome)   . Insomnia   . PTSD (post-traumatic stress disorder)   . Sleep apnea    not on Cpap machine but is on oxygen     Surgical History: Past Surgical History:  Procedure Laterality Date  . ABDOMINAL HYSTERECTOMY    . ACHILLES TENDON REPAIR  1982  . APPENDECTOMY    . CYSTOSCOPY WITH STENT PLACEMENT Right 06/27/2019   Procedure: CYSTOSCOPY WITH STENT PLACEMENT;  Surgeon: Crista Elliot, MD;  Location: [facility];  Service: Urology;  Laterality: Right;  . CYSTOSCOPY/RETROGRADE/URETEROSCOPY  07/14/2019   Procedure: CYSTOSCOPY/RETROGRADE/URETEROSCOPY;  Surgeon: Riki Altes, MD;  Location: [facility];  Service: Urology;;  . CYSTOSCOPY/URETEROSCOPY/HOLMIUM LASER/STENT PLACEMENT Right 07/14/2019   Procedure: CYSTOSCOPY/URETEROSCOPY/HOLMIUM LASER/STENT Exchange;  Surgeon: Riki Altes, MD;  Location: [facility] OR[facility];  Service: Urology;  Laterality: Right;  . FOOT SURGERY   2002   due to deformity  . HIP SURGERY  2012  . KNEE ARTHROSCOPY    . LAPAROSCOPIC HYSTERECTOMY  2000  . REPAIR TENDONS LEG     x2    Home Medications:  Allergies as of 08/10/2019      Reactions   Codeine Itching   Can take with benadryl      Medication List       Accurate as of August 10, 2019  1:51 PM. If you have any questions, ask your nurse or doctor.        amitriptyline 25 MG tablet Commonly known as: ELAVIL Take 25 mg by mouth at bedtime.   budesonide-formoterol 80-4.5 MCG/ACT inhaler Commonly known as: SYMBICORT Inhale 2 puffs into the lungs 2 (two) times daily.   dicyclomine 20 MG tablet Commonly known as: BENTYL Take 20 mg by mouth 3 (three) times daily before meals.   fluticasone 50 MCG/ACT nasal spray Commonly known as: FLONASE Place 2 sprays into both nostrils daily.   Horizant 600 MG Tbcr Generic drug: Gabapentin Enacarbil Take 600 mg by mouth 2 (two) times daily.   hydrOXYzine 25 MG capsule Commonly known as: VISTARIL Take 25 mg by mouth 3 (three) times daily.   ipratropium 0.03 % nasal spray Commonly known as: ATROVENT Place 2 sprays into both nostrils every 12 (twelve) hours.   ketorolac 10 MG tablet Commonly known as: TORADOL Take by mouth.   lamoTRIgine 100 MG tablet Commonly known as: LAMICTAL Take 100-200 mg by mouth See admin instructions. Take 1 tablet (100mg ) by mouth every  morning and take 2 tablets (200mg ) by mouth at bedtime   levocetirizine 5 MG tablet Commonly known as: XYZAL SMARTSIG:1 Tablet(s) By Mouth Every Evening   levorphanol 2 MG tablet Commonly known as: LEVODROMORAN Take 2 mg by mouth every 8 (eight) hours.   levothyroxine 137 MCG tablet Commonly known as: SYNTHROID Take 137 mcg by mouth daily.   lidocaine 5 % Commonly known as: LIDODERM Place 1 patch onto the skin daily.   metoprolol succinate 25 MG 24 hr tablet Commonly known as: TOPROL-XL Take 25 mg by mouth daily.   montelukast 10 MG  tablet Commonly known as: SINGULAIR Take 10 mg by mouth daily.   multivitamin with minerals Tabs tablet Take 1 tablet by mouth daily.   ondansetron 4 MG disintegrating tablet Commonly known as: ZOFRAN-ODT Take 4 mg by mouth every 8 (eight) hours as needed for nausea.   pantoprazole 40 MG tablet Commonly known as: PROTONIX Take 40 mg by mouth daily.   Pazeo 0.7 % Soln Generic drug: Olopatadine HCl Place 1 drop into both eyes 2 (two) times daily as needed (allergy symptoms).   QUEtiapine 25 MG tablet Commonly known as: SEROQUEL Take 25 mg by mouth at bedtime.   simvastatin 10 MG tablet Commonly known as: ZOCOR Take 10 mg by mouth daily.   tiotropium 18 MCG inhalation capsule Commonly known as: SPIRIVA Place 18 mcg into inhaler and inhale daily.   tiZANidine 4 MG tablet Commonly known as: ZANAFLEX Take 4 mg by mouth every 8 (eight) hours as needed for muscle spasms.   traZODone 100 MG tablet Commonly known as: DESYREL Take 2 tablets (200 mg total) by mouth at bedtime. Needs office visit   Uribel 118 MG Caps Take 1 capsule (118 mg total) by mouth 3 (three) times daily as needed (Urinary frequency, urgency, burning).   venlafaxine 50 MG tablet Commonly known as: EFFEXOR Take 50 mg by mouth 2 (two) times daily.   vitamin C 500 MG tablet Commonly known as: ASCORBIC ACID Take 1,000 mg by mouth daily.       Allergies:  Allergies  Allergen Reactions  . Codeine Itching    Can take with benadryl     Family History: Family History  Problem Relation Age of Onset  . Diabetes Mellitus II Father   . Kidney cancer Maternal Grandmother     Social History:   reports that she quit smoking about 5 years ago. Her smoking use included cigarettes. She has never used smokeless tobacco. She reports that she does not drink alcohol and does not use drugs.  Physical Exam: BP 136/77   Pulse 80   Ht 5\' 5"  (1.651 m)   Wt 198 lb (89.8 kg)   BMI 32.95 kg/m   Constitutional:   Alert and oriented, no acute distress, nontoxic appearing HEENT: [place], AT Cardiovascular: No clubbing, cyanosis, or edema Respiratory: Normal respiratory effort, no increased work of breathing Skin: No rashes, bruises or suspicious lesions Neurologic: Grossly intact, no focal deficits, moving all 4 extremities Psychiatric: Normal mood and affect  Laboratory Data: Results for orders placed or performed in visit on 08/10/19  Bladder Scan (Post Void Residual) in office  Result Value Ref Range   Scan Result 3    Assessment & Plan:   1. Bladder pain PVR significantly improved off oxybutynin. Recommend avoiding anticholinergics in the future. Ok to continue Uribel for pain control associated with CRPS. - Bladder Scan (Post Void Residual) in office  2. Right ureteral stone Due for  4 month postop follow up with RUS prior, but RUS not yet completed. Will reschedule for 4 weeks and counseled patient to call the imaging scheduling department to arrange ultrasound prior. She expressed understanding.   3. Microscopic hematuria Urine culture from last clinic visit still pending. Will treat as indicated. Will plan for repeat UA at her next scheduled visit.  Return in about 4 weeks (around 09/07/2019) for Postop f/u with RUS prior, repeat UA.  Carman Ching, PA-C  [facility] [address] [place], [name] [address] [phone]

## 2019-08-11 ENCOUNTER — Ambulatory Visit: Payer: Medicare Other | Admitting: Physician Assistant

## 2019-08-11 ENCOUNTER — Other Ambulatory Visit: Payer: Self-pay | Admitting: Urology

## 2019-08-11 LAB — CULTURE, URINE COMPREHENSIVE

## 2019-08-21 ENCOUNTER — Ambulatory Visit: Payer: Medicare Other

## 2019-09-11 ENCOUNTER — Encounter: Payer: Self-pay | Admitting: Physician Assistant

## 2019-09-11 ENCOUNTER — Ambulatory Visit: Payer: Medicare Other | Admitting: Physician Assistant

## 2019-09-27 ENCOUNTER — Emergency Department: Payer: Medicare Other

## 2019-09-27 ENCOUNTER — Observation Stay: Payer: Medicare Other

## 2019-09-27 ENCOUNTER — Other Ambulatory Visit: Payer: Self-pay

## 2019-09-27 ENCOUNTER — Observation Stay
Admission: EM | Admit: 2019-09-27 | Discharge: 2019-09-27 | Disposition: A | Discharge status: Home / Self Care | Payer: Medicare Other | Attending: Internal Medicine | Admitting: Internal Medicine

## 2019-09-27 DIAGNOSIS — F419 Anxiety disorder, unspecified: Secondary | ICD-10-CM

## 2019-09-27 DIAGNOSIS — I2510 Atherosclerotic heart disease of native coronary artery without angina pectoris: Secondary | ICD-10-CM | POA: Diagnosis not present

## 2019-09-27 DIAGNOSIS — Z79899 Other long term (current) drug therapy: Secondary | ICD-10-CM | POA: Insufficient documentation

## 2019-09-27 DIAGNOSIS — R748 Abnormal levels of other serum enzymes: Secondary | ICD-10-CM | POA: Diagnosis not present

## 2019-09-27 DIAGNOSIS — R0602 Shortness of breath: Secondary | ICD-10-CM | POA: Insufficient documentation

## 2019-09-27 DIAGNOSIS — Z20822 Contact with and (suspected) exposure to covid-19: Secondary | ICD-10-CM | POA: Insufficient documentation

## 2019-09-27 DIAGNOSIS — J45909 Unspecified asthma, uncomplicated: Secondary | ICD-10-CM | POA: Insufficient documentation

## 2019-09-27 DIAGNOSIS — R778 Other specified abnormalities of plasma proteins: Secondary | ICD-10-CM | POA: Diagnosis present

## 2019-09-27 DIAGNOSIS — E039 Hypothyroidism, unspecified: Secondary | ICD-10-CM | POA: Insufficient documentation

## 2019-09-27 DIAGNOSIS — K56609 Unspecified intestinal obstruction, unspecified as to partial versus complete obstruction: Secondary | ICD-10-CM

## 2019-09-27 DIAGNOSIS — R569 Unspecified convulsions: Secondary | ICD-10-CM | POA: Diagnosis not present

## 2019-09-27 LAB — CBC WITH DIFFERENTIAL/PLATELET
Abs Immature Granulocytes: 0.02 10*3/uL (ref 0.00–0.07)
Basophils Absolute: 0 10*3/uL (ref 0.0–0.1)
Basophils Relative: 1 %
Eosinophils Absolute: 0.9 10*3/uL — ABNORMAL HIGH (ref 0.0–0.5)
Eosinophils Relative: 11 %
HCT: 38.3 % (ref 36.0–46.0)
Hemoglobin: 13.2 g/dL (ref 12.0–15.0)
Immature Granulocytes: 0 %
Lymphocytes Relative: 35 %
Lymphs Abs: 3 10*3/uL (ref 0.7–4.0)
MCH: 29.8 pg (ref 26.0–34.0)
MCHC: 34.5 g/dL (ref 30.0–36.0)
MCV: 86.5 fL (ref 80.0–100.0)
Monocytes Absolute: 0.7 10*3/uL (ref 0.1–1.0)
Monocytes Relative: 8 %
Neutro Abs: 4 10*3/uL (ref 1.7–7.7)
Neutrophils Relative %: 45 %
Platelets: 188 10*3/uL (ref 150–400)
RBC: 4.43 MIL/uL (ref 3.87–5.11)
RDW: 12.1 % (ref 11.5–15.5)
WBC: 8.6 10*3/uL (ref 4.0–10.5)
nRBC: 0 % (ref 0.0–0.2)

## 2019-09-27 LAB — URINE DRUG SCREEN, QUALITATIVE (ARMC ONLY)
Amphetamines, Ur Screen: NOT DETECTED
Barbiturates, Ur Screen: NOT DETECTED
Benzodiazepine, Ur Scrn: POSITIVE — AB
Cannabinoid 50 Ng, Ur ~~LOC~~: NOT DETECTED
Cocaine Metabolite,Ur ~~LOC~~: NOT DETECTED
MDMA (Ecstasy)Ur Screen: NOT DETECTED
Methadone Scn, Ur: NOT DETECTED
Opiate, Ur Screen: NOT DETECTED
Phencyclidine (PCP) Ur S: NOT DETECTED
Tricyclic, Ur Screen: POSITIVE — AB

## 2019-09-27 LAB — COMPREHENSIVE METABOLIC PANEL
ALT: 30 U/L (ref 0–44)
AST: 33 U/L (ref 15–41)
Albumin: 4.3 g/dL (ref 3.5–5.0)
Alkaline Phosphatase: 105 U/L (ref 38–126)
Anion gap: 10 (ref 5–15)
BUN: 10 mg/dL (ref 6–20)
CO2: 27 mmol/L (ref 22–32)
Calcium: 9.2 mg/dL (ref 8.9–10.3)
Chloride: 103 mmol/L (ref 98–111)
Creatinine, Ser: 0.83 mg/dL (ref 0.44–1.00)
GFR calc Af Amer: >60 mL/min (ref >60)
GFR calc non Af Amer: >60 mL/min (ref >60)
Glucose, Bld: 149 mg/dL — ABNORMAL HIGH (ref 70–99)
Potassium: 3.6 mmol/L (ref 3.5–5.1)
Sodium: 140 mmol/L (ref 135–145)
Total Bilirubin: 0.8 mg/dL (ref 0.3–1.2)
Total Protein: 6.7 g/dL (ref 6.5–8.1)

## 2019-09-27 LAB — BLOOD GAS, VENOUS
Acid-Base Excess: 2.6 mmol/L — ABNORMAL HIGH (ref 0.0–2.0)
Bicarbonate: 27.2 mmol/L (ref 20.0–28.0)
O2 Saturation: 85.4 %
Patient temperature: 37
pCO2, Ven: 41 mmHg — ABNORMAL LOW (ref 44.0–60.0)
pH, Ven: 7.43 (ref 7.250–7.430)
pO2, Ven: 49 mmHg — ABNORMAL HIGH (ref 32.0–45.0)

## 2019-09-27 LAB — URINALYSIS, COMPLETE (UACMP) WITH MICROSCOPIC
Bacteria, UA: NONE SEEN
Bilirubin Urine: NEGATIVE
Glucose, UA: NEGATIVE mg/dL
Hgb urine dipstick: NEGATIVE
Ketones, ur: NEGATIVE mg/dL
Leukocytes,Ua: NEGATIVE
Nitrite: NEGATIVE
Protein, ur: NEGATIVE mg/dL
Specific Gravity, Urine: 1.012 (ref 1.005–1.030)
Squamous Epithelial / HPF: NONE SEEN (ref 0–5)
WBC, UA: NONE SEEN WBC/hpf (ref 0–5)
pH: 6 (ref 5.0–8.0)

## 2019-09-27 LAB — TROPONIN I (HIGH SENSITIVITY)
Troponin I (High Sensitivity): 109 ng/L (ref <18)
Troponin I (High Sensitivity): 4 ng/L (ref <18)
Troponin I (High Sensitivity): 3 ng/L (ref <18)

## 2019-09-27 LAB — TSH: TSH: 3.51 u[IU]/mL (ref 0.350–4.500)

## 2019-09-27 LAB — BRAIN NATRIURETIC PEPTIDE: B Natriuretic Peptide: 73 pg/mL (ref 0.0–100.0)

## 2019-09-27 LAB — MAGNESIUM: Magnesium: 2 mg/dL (ref 1.7–2.4)

## 2019-09-27 LAB — PREGNANCY, URINE: Preg Test, Ur: NEGATIVE

## 2019-09-27 LAB — AMMONIA: Ammonia: 29 umol/L (ref 9–35)

## 2019-09-27 LAB — SARS CORONAVIRUS 2 BY RT PCR (HOSPITAL ORDER, PERFORMED IN ~~LOC~~ HOSPITAL LAB): SARS Coronavirus 2: NEGATIVE

## 2019-09-27 LAB — CK: Total CK: 180 U/L (ref 38–234)

## 2019-09-27 MED ORDER — GABAPENTIN ENACARBIL ER 600 MG PO TBCR: 600.0000 mg | EXTENDED_RELEASE_TABLET | Freq: Two times a day (BID) | ORAL | Status: DC

## 2019-09-27 MED ORDER — MIDAZOLAM HCL 5 MG/5ML IJ SOLN
2.0000 mg | Freq: Once | INTRAMUSCULAR | Status: DC
  Filled 2019-09-27: qty 5

## 2019-09-27 MED ORDER — MOMETASONE FURO-FORMOTEROL FUM 100-5 MCG/ACT IN AERO
2.0000 | INHALATION_SPRAY | Freq: Two times a day (BID) | RESPIRATORY_TRACT | Status: DC
  Administered 2019-09-27: 2 via RESPIRATORY_TRACT
  Filled 2019-09-27: qty 8.8

## 2019-09-27 MED ORDER — ASCORBIC ACID 500 MG PO TABS
1000.0000 mg | ORAL_TABLET | Freq: Every day | ORAL | Status: DC
  Administered 2019-09-27: 1000 mg via ORAL
  Filled 2019-09-27: qty 2

## 2019-09-27 MED ORDER — ONDANSETRON HCL 4 MG/2ML IJ SOLN: 4.0000 mg | Freq: Four times a day (QID) | INTRAMUSCULAR | Status: DC | PRN

## 2019-09-27 MED ORDER — MONTELUKAST SODIUM 10 MG PO TABS
10.0000 mg | ORAL_TABLET | Freq: Every day | ORAL | Status: DC
  Filled 2019-09-27: qty 1

## 2019-09-27 MED ORDER — SODIUM CHLORIDE 0.9 % IV SOLN: INTRAVENOUS | Status: DC

## 2019-09-27 MED ORDER — ACETAMINOPHEN 650 MG RE SUPP: 650.0000 mg | Freq: Four times a day (QID) | RECTAL | Status: DC | PRN

## 2019-09-27 MED ORDER — VENLAFAXINE HCL ER 150 MG PO CP24
150.0000 mg | ORAL_CAPSULE | Freq: Every day | ORAL | Status: DC
  Filled 2019-09-27: qty 1

## 2019-09-27 MED ORDER — TIZANIDINE HCL 4 MG PO TABS
4.0000 mg | ORAL_TABLET | Freq: Three times a day (TID) | ORAL | Status: DC | PRN
  Filled 2019-09-27: qty 1

## 2019-09-27 MED ORDER — LAMOTRIGINE 100 MG PO TABS: 100.0000 mg | ORAL_TABLET | Freq: Every morning | ORAL | Status: DC

## 2019-09-27 MED ORDER — OLOPATADINE HCL 0.1 % OP SOLN
1.0000 [drp] | Freq: Two times a day (BID) | OPHTHALMIC | Status: DC | PRN
  Filled 2019-09-27: qty 5

## 2019-09-27 MED ORDER — PANTOPRAZOLE SODIUM 40 MG PO TBEC
40.0000 mg | DELAYED_RELEASE_TABLET | Freq: Every day | ORAL | Status: DC
  Administered 2019-09-27: 40 mg via ORAL
  Filled 2019-09-27: qty 1

## 2019-09-27 MED ORDER — HALOPERIDOL 0.5 MG PO TABS
0.2500 mg | ORAL_TABLET | Freq: Three times a day (TID) | ORAL | Status: DC
  Administered 2019-09-27: 0.25 mg via ORAL
  Filled 2019-09-27 (×2): qty 0.5

## 2019-09-27 MED ORDER — LORATADINE 10 MG PO TABS: 10.0000 mg | ORAL_TABLET | Freq: Every day | ORAL | Status: DC

## 2019-09-27 MED ORDER — LEVOTHYROXINE SODIUM 137 MCG PO TABS
137.0000 ug | ORAL_TABLET | Freq: Every day | ORAL | Status: DC
  Administered 2019-09-27: 137 ug via ORAL
  Filled 2019-09-27 (×2): qty 1

## 2019-09-27 MED ORDER — ADULT MULTIVITAMIN W/MINERALS CH
1.0000 | ORAL_TABLET | Freq: Every day | ORAL | Status: DC
  Administered 2019-09-27: 1 via ORAL
  Filled 2019-09-27: qty 1

## 2019-09-27 MED ORDER — GABAPENTIN 300 MG PO CAPS: 600.0000 mg | ORAL_CAPSULE | Freq: Two times a day (BID) | ORAL | Status: DC

## 2019-09-27 MED ORDER — ONDANSETRON 4 MG PO TBDP: 4.0000 mg | ORAL_TABLET | Freq: Three times a day (TID) | ORAL | Status: DC | PRN

## 2019-09-27 MED ORDER — LAMOTRIGINE 100 MG PO TABS: 100.0000 mg | ORAL_TABLET | ORAL | Status: DC

## 2019-09-27 MED ORDER — FLUTICASONE PROPIONATE 50 MCG/ACT NA SUSP
2.0000 | Freq: Every day | NASAL | Status: DC
  Administered 2019-09-27: 2 via NASAL
  Filled 2019-09-27: qty 16

## 2019-09-27 MED ORDER — DIAZEPAM 5 MG PO TABS
5.0000 mg | ORAL_TABLET | Freq: Three times a day (TID) | ORAL | Status: DC
  Administered 2019-09-27: 5 mg via ORAL
  Filled 2019-09-27: qty 1

## 2019-09-27 MED ORDER — METOPROLOL SUCCINATE ER 50 MG PO TB24: 25.0000 mg | ORAL_TABLET | Freq: Every day | ORAL | Status: DC

## 2019-09-27 MED ORDER — VENLAFAXINE HCL 25 MG PO TABS: 50.0000 mg | ORAL_TABLET | Freq: Two times a day (BID) | ORAL | Status: DC

## 2019-09-27 MED ORDER — TIOTROPIUM BROMIDE MONOHYDRATE 18 MCG IN CAPS
18.0000 ug | ORAL_CAPSULE | Freq: Every day | RESPIRATORY_TRACT | Status: DC
  Administered 2019-09-27: 18 ug via RESPIRATORY_TRACT
  Filled 2019-09-27: qty 5

## 2019-09-27 MED ORDER — SODIUM CHLORIDE 0.9% FLUSH: 3.0000 mL | Freq: Two times a day (BID) | INTRAVENOUS | Status: DC

## 2019-09-27 MED ORDER — SODIUM CHLORIDE 0.9% FLUSH: 3.0000 mL | INTRAVENOUS | Status: DC | PRN

## 2019-09-27 MED ORDER — SIMVASTATIN 10 MG PO TABS: 10.0000 mg | ORAL_TABLET | Freq: Every day | ORAL | Status: DC

## 2019-09-27 MED ORDER — QUETIAPINE FUMARATE 25 MG PO TABS: 25.0000 mg | ORAL_TABLET | Freq: Every day | ORAL | Status: DC

## 2019-09-27 MED ORDER — LEVOCETIRIZINE DIHYDROCHLORIDE 5 MG PO TABS: 2.5000 mg | ORAL_TABLET | Freq: Every evening | ORAL | Status: DC

## 2019-09-27 MED ORDER — DICYCLOMINE HCL 20 MG PO TABS
20.0000 mg | ORAL_TABLET | Freq: Three times a day (TID) | ORAL | Status: DC
  Administered 2019-09-27: 20 mg via ORAL
  Filled 2019-09-27: qty 1

## 2019-09-27 MED ORDER — ONDANSETRON HCL 4 MG PO TABS: 4.0000 mg | ORAL_TABLET | Freq: Four times a day (QID) | ORAL | Status: DC | PRN

## 2019-09-27 MED ORDER — ASPIRIN 325 MG PO TABS
325.0000 mg | ORAL_TABLET | Freq: Every day | ORAL | Status: DC
  Administered 2019-09-27: 325 mg via ORAL
  Filled 2019-09-27: qty 1

## 2019-09-27 MED ORDER — AMITRIPTYLINE HCL 50 MG PO TABS: 25.0000 mg | ORAL_TABLET | Freq: Every day | ORAL | Status: DC

## 2019-09-27 MED ORDER — LAMOTRIGINE 100 MG PO TABS: 100.0000 mg | ORAL_TABLET | Freq: Two times a day (BID) | ORAL | Status: DC

## 2019-09-27 MED ORDER — ENOXAPARIN SODIUM 40 MG/0.4ML ~~LOC~~ SOLN
40.0000 mg | SUBCUTANEOUS | Status: DC
  Administered 2019-09-27: 40 mg via SUBCUTANEOUS
  Filled 2019-09-27: qty 0.4

## 2019-09-27 MED ORDER — LAMOTRIGINE 100 MG PO TABS: 200.0000 mg | ORAL_TABLET | Freq: Every day | ORAL | Status: DC

## 2019-09-27 MED ORDER — ONDANSETRON 4 MG PO TBDP
ORAL_TABLET | ORAL | Status: AC
  Administered 2019-09-27: 4 mg via ORAL
  Filled 2019-09-27: qty 1

## 2019-09-27 MED ORDER — IPRATROPIUM BROMIDE 0.03 % NA SOLN
2.0000 | Freq: Two times a day (BID) | NASAL | Status: DC
  Administered 2019-09-27: 2 via NASAL
  Filled 2019-09-27: qty 30

## 2019-09-27 MED ORDER — MIDAZOLAM HCL 5 MG/5ML IJ SOLN
2.0000 mg | Freq: Once | INTRAMUSCULAR | Status: AC
  Administered 2019-09-27: 2 mg via INTRAMUSCULAR

## 2019-09-27 MED ORDER — ACETAMINOPHEN 325 MG PO TABS: 650.0000 mg | ORAL_TABLET | Freq: Four times a day (QID) | ORAL | Status: DC | PRN

## 2019-09-27 MED ORDER — LIDOCAINE 5 % EX PTCH
1.0000 | MEDICATED_PATCH | Freq: Every day | CUTANEOUS | Status: DC
  Filled 2019-09-27: qty 1

## 2019-09-27 MED ORDER — SODIUM CHLORIDE 0.9 % IV SOLN: 250.0000 mL | INTRAVENOUS | Status: DC | PRN

## 2019-09-27 MED ORDER — ASPIRIN EC 81 MG PO TBEC: 81.0000 mg | DELAYED_RELEASE_TABLET | Freq: Every day | ORAL | Status: DC

## 2019-09-27 NOTE — ED Notes (Signed)
Report to include Situation, Background, Assessment, and Recommendations received from Rebekah RN. Patient alert and oriented, warm and dry, in no acute distress. Patient denies SI, HI, AVH and pain. Patient made aware of Q15 minute rounds and Rover and Officer presence for their safety. Patient instructed to come to me with needs or concerns.  

## 2019-09-27 NOTE — ED Notes (Signed)
Pt given cup of water and assisted with water.

## 2019-09-27 NOTE — ED Notes (Signed)
Pt has an episode of NV at this time. Pt urinated the bed. Pt full linen change at this time and purewick placed at this time.

## 2019-09-27 NOTE — ED Notes (Signed)
Pt given dinner meal tray at this time.  

## 2019-09-27 NOTE — ED Notes (Signed)
IV dcd right arm with cath intact.

## 2019-09-27 NOTE — ED Provider Notes (Addendum)
[facility] Emergency Department Provider Note  ____________________________________________   First MD Initiated Contact with Patient 09/27/19 1008     (approximate)  I have reviewed the triage vital signs and the nursing notes.   HISTORY  Chief Complaint Seizures   HPI Katie Neal is a 49 y.o. female with past medical prostatitis, asthma, COPD, CRPS, depression, IBS, PTSD, OSA and seizure disorder who presents via EMS from home after EMS was called by family with concerns for possible seizure.  Per EMS notes her seizure-like activity was witnessed on arrival although patient was somewhat combative and confused and refused work-up for bed.  She was given 2 of IM Versed and was transported.  Patient is confused on arrival and unable to participate in any history.  Review of records so she has been prescribed Lamictal for seizures in the past.  I did attempt to reach patient's family with number listed on the chart but this went to voicemail.         Past Medical History:  Diagnosis Date  . Arthritis   . Asthma   . COPD (chronic obstructive pulmonary disease) (HCC)   . CRPS (complex regional pain syndrome type I)   . Depression   . Headache   . History of kidney stones   . IBS (irritable bowel syndrome)   . Insomnia   . PTSD (post-traumatic stress disorder)   . Sleep apnea    not on Cpap machine but is on oxygen     Patient Active Problem List   Diagnosis Date Noted  . Elevated troponin 09/27/2019  . IBS (irritable bowel syndrome) 08/07/2019  . Fever   . Right lower quadrant abdominal pain   . Right ureteral stone 06/27/2019  . Sepsis (HCC) 06/27/2019  . Acute pyelonephritis 06/27/2019  . Weakness   . Seizure (HCC) 12/30/2018  . CRPS (complex regional pain syndrome type I)   . HLD (hyperlipidemia)   . Lower urinary tract infectious disease   . Fall   . Hypothyroidism   . Headache disorder 05/06/2018  . Dizziness 01/30/2018  .  Post concussion syndrome 01/30/2018  . Vasovagal syncope 11/27/2017  . Seizure-like activity (HCC) 11/27/2017  . Concussion with no loss of consciousness, initial encounter 10/23/2017  . Excessive falling 10/23/2017  . Urinary incontinence 10/23/2017  . Chronic obstructive pulmonary disease (HCC) 09/09/2017  . Obesity (BMI 35.0-39.9 without comorbidity) 06/07/2017  . Transient neurological symptoms 05/24/2017  . History of chronic sinusitis 12/21/2016  . Abnormal PFTs 12/06/2015  . Adjustment disorder with depressed mood 06/17/2015  . Acute urinary retention 04/05/2015  . Gross hematuria 04/05/2015  . Acute non-recurrent maxillary sinusitis 10/20/2014  . History of environmental allergies 07/08/2014  . On home oxygen therapy 07/07/2014  . Gastroparesis 06/28/2014  . Pharyngoesophageal dysphagia 06/01/2014  . ANA positive 05/11/2014  . Anemia 05/11/2014  . Shortness of breath 04/25/2014  . Hypoxia 04/16/2014  . Hiatal hernia 04/16/2014  . Former tobacco use 04/16/2014  . 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 Hypoxemia requiring supplemental oxygen 03/22/2014  . Globus sensation 03/20/2014  . 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 Midepigastric pain 03/20/2014  . Pneumonia due to organism 03/19/2014  . Tachycardia 03/02/2014  . POTS (postural orthostatic tachycardia syndrome) 03/02/2014  . Pain in joint, forearm 12/07/2013  . Bilateral hand pain 10/28/2013  . Insomnia 09/18/2013  . High risk medication use 05/27/2013  . Neurogenic pain of left foot 05/07/2013  . Depression 04/08/2013  . Major  depressive disorder, recurrent episode, moderate (HCC) 04/08/2013  . Closed fracture of metatarsal bone 02/16/2013  . Low back pain 02/10/2013  . OA (osteoarthritis) of knee 08/27/2012  . Osteopenia 06/20/2012  . H/O arthroplasty 09/20/2011  . ACL graft tear (HCC) 08/02/2011  . Sprain of medial collateral ligament of knee 08/02/2011    Past Surgical History:    Procedure Laterality Date  . ABDOMINAL HYSTERECTOMY    . ACHILLES TENDON REPAIR  1982  . APPENDECTOMY    . CYSTOSCOPY WITH STENT PLACEMENT Right 06/27/2019   Procedure: CYSTOSCOPY WITH STENT PLACEMENT;  Surgeon: Crista Elliot, MD;  Location: [facility] ORS;  Service: Urology;  Laterality: Right;  . CYSTOSCOPY/RETROGRADE/URETEROSCOPY  07/14/2019   Procedure: CYSTOSCOPY/RETROGRADE/URETEROSCOPY;  Surgeon: Riki Altes, MD;  Location: [facility] ORS;  Service: Urology;;  . CYSTOSCOPY/URETEROSCOPY/HOLMIUM LASER/STENT PLACEMENT Right 07/14/2019   Procedure: CYSTOSCOPY/URETEROSCOPY/HOLMIUM LASER/STENT Exchange;  Surgeon: Riki Altes, MD;  Location: [facility] ORS;  Service: Urology;  Laterality: Right;  . FOOT SURGERY  2002   due to deformity  . HIP SURGERY  2012  . KNEE ARTHROSCOPY    . LAPAROSCOPIC HYSTERECTOMY  2000  . REPAIR TENDONS LEG     x2    Prior to Admission medications   Medication Sig Start Date End Date Taking? Authorizing Provider  amitriptyline (ELAVIL) 25 MG tablet Take 25 mg by mouth at bedtime.    Yes [provider]  budesonide-formoterol (SYMBICORT) 80-4.5 MCG/ACT inhaler Inhale 2 puffs into the lungs 2 (two) times daily.   Yes [provider]  dicyclomine (BENTYL) 20 MG tablet Take 20 mg by mouth 3 (three) times daily before meals.    Yes [provider]  fluticasone (FLONASE) 50 MCG/ACT nasal spray Place 2 sprays into both nostrils daily.   Yes [provider]  hydrOXYzine (VISTARIL) 25 MG capsule Take 25 mg by mouth 3 (three) times daily. 06/18/19  Yes [provider]  ipratropium (ATROVENT) 0.03 % nasal spray Place 2 sprays into both nostrils every 12 (twelve) hours.   Yes [provider]  levocetirizine (XYZAL) 5 MG tablet SMARTSIG:1 Tablet(s) By Mouth Every Evening 04/12/19  Yes [provider]  levorphanol (LEVODROMORAN) 2 MG tablet Take 2 mg by mouth every 8 (eight) hours as needed for pain.    Yes [provider]  levothyroxine (SYNTHROID, LEVOTHROID) 137 MCG tablet Take 137 mcg by mouth daily. 11/10/15  Yes [provider]  metoprolol succinate (TOPROL-XL) 25 MG 24 hr tablet Take 25 mg by mouth daily.   Yes [provider]  montelukast (SINGULAIR) 10 MG tablet Take 10 mg by mouth daily. 12/06/15  Yes [provider]  Multiple Vitamin (MULTIVITAMIN WITH MINERALS) TABS tablet Take 1 tablet by mouth daily.   Yes [provider]  pantoprazole (PROTONIX) 40 MG tablet Take 40 mg by mouth daily.   Yes [provider]  QUEtiapine (SEROQUEL) 25 MG tablet Take 25 mg by mouth at bedtime. 06/05/19  Yes [provider]  simvastatin (ZOCOR) 10 MG tablet Take 10 mg by mouth at bedtime.    Yes [provider]  tiZANidine (ZANAFLEX) 4 MG tablet Take 4 mg by mouth every 8 (eight) hours as needed for muscle spasms.   Yes [provider]  traZODone (DESYREL) 100 MG tablet Take 2 tablets (200 mg total) by mouth at bedtime. Needs office visit 12/14/11  Yes Rhoderick Moody M, PA-C  venlafaxine XR (EFFEXOR-XR) 75 MG 24 hr capsule Take 225 mg by mouth  daily with breakfast.   Yes [provider]  vitamin C (ASCORBIC ACID) 500 MG tablet Take 1,000 mg by mouth daily.   Yes [provider]  Gabapentin Enacarbil (HORIZANT) 600 MG TBCR Take 600 mg by mouth 2 (two) times daily. Patient not taking: Reported on 09/27/2019    [provider]  lamoTRIgine (LAMICTAL) 100 MG tablet Take 100-200 mg by mouth See admin instructions. Take 1 tablet ( ) by mouth every morning and take 2 tablets ( ) by mouth at bedtime    [provider]  lidocaine (LIDODERM) 5 % Place 1 patch onto the skin daily. 05/21/19   [provider]  Meth-Hyo-M Salley Hews Phos-Ph Sal (URO-MP) 118 MG CAPS TAKE 1 CAPSULE (118 MG TOTAL) BY MOUTH 3 (THREE) TIMES DAILY AS NEEDED (URINARY FREQUENCY, URGENCY, BURNING). 08/11/19   Stoioff, Verna Czech, MD    Olopatadine HCl (PAZEO) 0.7 % SOLN Place 1 drop into both eyes 2 (two) times daily as needed (allergy symptoms).     [provider]  ondansetron (ZOFRAN-ODT) 4 MG disintegrating tablet Take 4 mg by mouth every 8 (eight) hours as needed for nausea. 01/13/15   [provider]  tiotropium (SPIRIVA) 18 MCG inhalation capsule Place 18 mcg into inhaler and inhale daily.    [provider]    Allergies Codeine  Family History  Problem Relation Age of Onset  . Diabetes Mellitus II Father   . Kidney cancer Maternal Grandmother     Social History Social History   Tobacco Use  . Smoking status: Former Smoker    Types: Cigarettes    Quit date: 10/22/2013    Years since quitting: 5.9  . Smokeless tobacco: Never Used  Substance Use Topics  . Alcohol use: No    Comment: very rarely  . Drug use: No    Review of Systems  Review of Systems  Unable to perform ROS: Mental status change  Neurological: Positive for seizures ( per EMS ).      ____________________________________________   PHYSICAL EXAM:  VITAL SIGNS: ED Triage Vitals [09/27/19 1008]  Enc Vitals Group     BP 138/79     Pulse      Resp      Temp      Temp src      SpO2      Weight      Height      Head Circumference      Peak Flow      Pain Score      Pain Loc      Pain Edu?      Excl. in GC?    Vitals:   09/27/19 1251 09/27/19 1252  BP: (!) 126/98   Pulse: 90 97  Resp: 20   Temp:    SpO2: 94% 93%   Physical Exam Vitals and nursing note reviewed.  HENT:     Head: Normocephalic and atraumatic.     Right Ear: External ear normal.     Left Ear: External ear normal.     Nose: Nose normal.  Eyes:     Extraocular Movements: Extraocular movements intact.     Conjunctiva/sclera: Conjunctivae normal.     Pupils: Pupils are equal, round, and reactive to light.  Cardiovascular:     Rate and Rhythm: Normal rate and regular rhythm.     Pulses: Normal pulses.  Pulmonary:      Effort: Pulmonary effort is normal. No respiratory distress.     Breath sounds:  Normal breath sounds. No stridor. No wheezing, rhonchi or rales.  Chest:     Chest wall: No tenderness.  Abdominal:     General: There is no distension.     Tenderness: There is no abdominal tenderness.  Musculoskeletal:     Cervical back: No rigidity.     Right lower leg: No edema.     Left lower leg: No edema.  Skin:    Capillary Refill: Capillary refill takes less than 2 seconds.  Neurological:     Mental Status: She is alert. She is disoriented and confused.  Psychiatric:        Mood and Affect: Mood is anxious.        Speech: She is noncommunicative.     PERRLA.  EOMI.  Patient is moving all extremities with symmetric strength and withdraws to stimuli all extremities but does not participate in any formal neuro exam.  Patient is rolling from side to side in bed intermittently saying help me refusing to answer to name. ____________________________________________   LABS (all labs ordered are listed, but only abnormal results are displayed)  Labs Reviewed  CBC WITH DIFFERENTIAL/PLATELET - Abnormal; Notable for the following components:      Result Value   Eosinophils Absolute 0.9 (*)    All other components within normal limits  COMPREHENSIVE METABOLIC PANEL - Abnormal; Notable for the following components:   Glucose, Bld 149 (*)    All other components within normal limits  BLOOD GAS, VENOUS - Abnormal; Notable for the following components:   pCO2, Ven 41 (*)    pO2, Ven 49.0 (*)    Acid-Base Excess 2.6 (*)    All other components within normal limits  URINE DRUG SCREEN, QUALITATIVE ([facility] ONLY) - Abnormal; Notable for the following components:   Tricyclic, Ur Screen POSITIVE (*)    Benzodiazepine, Ur Scrn POSITIVE (*)    All other components within normal limits  URINALYSIS, COMPLETE (UACMP) WITH MICROSCOPIC - Abnormal; Notable for the following components:   Color, Urine YELLOW (*)     APPearance CLEAR (*)    All other components within normal limits  TROPONIN I (HIGH SENSITIVITY) - Abnormal; Notable for the following components:   Troponin I (High Sensitivity) 109 (*)    All other components within normal limits  SARS CORONAVIRUS 2 BY RT PCR (HOSPITAL ORDER, PERFORMED IN [place] [facility])  MAGNESIUM  AMMONIA  PREGNANCY, URINE  TSH  BRAIN NATRIURETIC PEPTIDE  LAMOTRIGINE LEVEL  CK  TROPONIN I (HIGH SENSITIVITY)   ____________________________________________  EKG  Sinus rhythm with a ventricular rate of 93, poor tracing throughout likely secondary to patient motion with prolonged QTc interval at 519 and nonspecific ST changes throughout without other clear evidence of acute ischemia. ____________________________________________  RADIOLOGY  ED MD interpretation: Chest x-ray with mild pulmonary edema stable chronic interstitial disease.  CT head unremarkable for acute pathology.  Official radiology report(s): DG Chest 1 View  Result Date: 09/27/2019 CLINICAL DATA:  Vomiting.  Ex-smoker.  History of asthma and COPD. EXAM: CHEST  1 VIEW COMPARISON:  12/30/2018 FINDINGS: Mildly enlarged cardiac silhouette with an increase in size. Interval mild prominence of the pulmonary vasculature. Stable mild chronic prominence of the interstitial markings. Unremarkable bones. IMPRESSION: 1. Interval mild cardiomegaly and mild pulmonary vascular congestion. 2. Stable mild chronic interstitial lung disease. Electronically Signed   By: Beckie Salts M.D.   On: 09/27/2019 10:23   CT Head Wo Contrast  Result Date: 09/27/2019 CLINICAL DATA:  Seizure.  Projectile vomiting. EXAM: CT HEAD WITHOUT CONTRAST TECHNIQUE: Contiguous axial images were obtained from the base of the skull through the vertex without intravenous contrast. COMPARISON:  12/30/2018 FINDINGS: Brain: Age advanced cerebral atrophy again identified. No mass lesion, hemorrhage, hydrocephalus, acute infarct, intra-axial,  or extra-axial fluid collection. Vascular: No hyperdense vessel or unexpected calcification. Skull: No significant soft tissue swelling.  No skull fracture. Sinuses/Orbits: Normal imaged portions of the orbits and globes. Mucosal thickening of ethmoid air cells. Other: None. IMPRESSION: 1. No acute intracranial abnormality. 2. Age advanced cerebral atrophy. 3. Sinus disease. Electronically Signed   By: Jeronimo Greaves M.D.   On: 09/27/2019 11:26    ____________________________________________   PROCEDURES  Procedure(s) performed (including Critical Care):  .1-3 Lead EKG Interpretation Performed by: Neal Chiquito, MD Authorized by: Neal Chiquito, MD     Interpretation: normal     ECG rate assessment: normal     Rhythm: sinus rhythm     Ectopy: none     Conduction: normal       ____________________________________________   INITIAL IMPRESSION / ASSESSMENT AND PLAN / ED COURSE        Overall is unclear the exact etiology for patient's presentation.  Does appear distressed is refusing to participate in history or exam but is hemodynamically stable on room air on arrival.  Remainder of exam as above.  Differential includes postictal state from witnessed seizure, nonepileptiform seizure-like activity, trauma, intracranial bleeding, sepsis, meningitis, ACS, metabolic derangement, toxic ingestion, polypharmacy, or other unclear etiology.  CT head and chest x-ray as well as exam showed no evidence of acute traumatic injuries.  There is no evidence of intracranial bleeding on CT head or other acute intracranial process.  Chest x-ray shows some mild pulmonary edema and stable interstitial lung disease without evidence of any rib fractures or focal consolidation suggestive of pneumonia.  There are no significant metabolic or electrolyte derangements identified above labs.  Ammonia is unremarkable.  Given somewhat equivocal EKG with history of emesis prior to arrival will obtain troponin to  assess for any evidence of ACS.  Lamictal level was also sent.  VBG without evidence of significant acidosis or hypercarbic failure.  UA is not consistent with cystitis.  Low suspicion for sepsis at this time.  I did discuss patient's presentation and work-up with on-call neurologist Dr. Kathie Rhodes. 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  In addition is unclear if patient's complex regional pain syndrome, anxiety, depression, and PTSD are contributing to her symptoms as well.  She is noted to be on 2 SNRIs as well as gabapentin although these do not appear new given acute onset of symptoms per EMS and report of from family who did arrive at bedside low suspicion for acute polypharmacy complication.  Given high concern for possible nonepileptiform seizure-like activity with significant underlying psychiatric illness with patient refusing to participate in exam or history psychiatry service was consulted to make recommendations.  Psychiatry service recommends initiation of Valium given severe anxiety likely contributing to patient's presentation.  Troponin results of 109.  Repeat EKG obtained to obtain that shows sinus rhythm with a ventricular rate of 95, normal axis, unremarkable intervals, no clear evidence of acute ischemia or other significant change although somewhat abnormal R wave progression.  ASA ordered.  Will plan to admit to medicine service for observation given somewhat unexpected finding  of elevated troponin.  Unclear etiology although demand ischemia in the setting of significant agitation is within differential.  No clear evidence on EKG of anatomic ischemia.  Will plan to trend troponin and if there is a large delta initiate heparin.  Will plan to admit to hospital service for further evaluation  management. ____________________________________________   FINAL CLINICAL IMPRESSION(S) / ED DIAGNOSES  Final diagnoses:  Anxiety  Troponin I above reference range    Medications  amitriptyline (ELAVIL) tablet 25 mg (has no administration in time range)  mometasone-formoterol (DULERA) 100-5 MCG/ACT inhaler 2 puff (has no administration in time range)  dicyclomine (BENTYL) tablet 20 mg (has no administration in time range)  fluticasone (FLONASE) 50 MCG/ACT nasal spray 2 spray (has no administration in time range)  Gabapentin Enacarbil TBCR 600 mg (has no administration in time range)  ipratropium (ATROVENT) 0.03 % nasal spray 2 spray (has no administration in time range)  levocetirizine (XYZAL) tablet 2.5 mg (has no administration in time range)  levothyroxine (SYNTHROID) tablet 137 mcg (has no administration in time range)  lidocaine (LIDODERM) 5 % 1 patch (has no administration in time range)  montelukast (SINGULAIR) tablet 10 mg (has no administration in time range)  multivitamin with minerals tablet 1 tablet (has no administration in time range)  Olopatadine HCl 0.7 % SOLN 1 drop (has no administration in time range)  ondansetron (ZOFRAN-ODT) disintegrating tablet 4 mg (has no administration in time range)  pantoprazole (PROTONIX) EC tablet 40 mg (has no administration in time range)  QUEtiapine (SEROQUEL) tablet 25 mg (has no administration in time range)  simvastatin (ZOCOR) tablet 10 mg (has no administration in time range)  tiotropium (SPIRIVA) inhalation capsule ([facility] use ONLY) 18 mcg (has no administration in time range)  tiZANidine (ZANAFLEX) tablet 4 mg (has no administration in time range)  ascorbic acid (VITAMIN C) tablet 1,000 mg (has no administration in time range)  diazepam (VALIUM) tablet 5 mg (has no administration in time range)  venlafaxine XR (EFFEXOR-XR) 24 hr capsule 150 mg (has no administration in time range)  lamoTRIgine (LAMICTAL) tablet 100 mg (has no  administration in time range)  haloperidol (HALDOL) tablet 0.25 mg (has no administration in time range)  aspirin tablet 325 mg (has no administration in time range)  midazolam (VERSED) 5 MG/5ML injection 2 mg (2 mg Intramuscular Given 09/27/19 1043)     ED Discharge Orders    None       Note:  This document was prepared using Dragon voice recognition software and may include unintentional dictation errors.   Neal ChiquitoSmith, Janani Chamber P, MD 09/27/19 1504    Neal ChiquitoSmith, Charnell Peplinski P, MD 09/27/19 [phone]

## 2019-09-27 NOTE — ED Notes (Signed)
Daughter in law very anxious at bedside, stating pt needs her psychiatric meds. Stated that we are in the process of getting her medications verified at this time and it will take a minute. Reassured family that we are still monitoring her and that she seems better than she did when she arrived and I will get her the medication as soon as they are verified. Family at bedside verbalized understanding.

## 2019-09-27 NOTE — ED Notes (Signed)
Pt having random outburst of flaring arms and legs. Pt keeps stating "help me help me." Pt is alert. Pt's daughter in law describing these outburst as seizures. V/S are stable and pt orientation same as prior to "seizure like activity"

## 2019-09-27 NOTE — Consult Note (Signed)
Called by ER provider to admit this patient who presented to the ER for evaluation of pseudoseizures as well as mental status changes. Reason for admission to medicine for elevated troponin. Patient has no EKG changes. Repeat troponin was within normal limits and a second troponin was normal as well. Discussed with ER provider as well as well as psychiatrist, no indication for medical admission at this time.
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
## 2019-09-27 NOTE — ED Notes (Signed)
Date and time results received: 09/27/19 1347  Test: Troponin Critical Value: 109  Name of Provider Notified: Dr. Michiel Sites, MD

## 2019-09-27 NOTE — ED Triage Notes (Signed)
Pt arrives via EMS from [facility] after hamily called for seizure activity- pt has a hx of seizures- family states they have never seen her like this- pt VSS per EMS- pt given 2 versed IM by EMS- EMS states she was projectile vomiting- pt unable to answer questions appropriately and just keeps asking for help- pt has CRPS in her feet

## 2019-09-27 NOTE — ED Notes (Signed)
Pt more alert and oriented at this time. Pt able to follow commands, pt still very restless and anxious.

## 2019-09-27 NOTE — ED Provider Notes (Addendum)
Further observation here in the ER patient has been stable on telemetry.  No dysrhythmia.  She has 2 - troponins.  Suspect the initial one may have been secondary to laboratory error she is not having any sort of chest pain or pressure at this time.  Does not seem consistent with ACS.   Willy Eddy, MD 09/27/19 1738  Patient observed in the ER additional time she is not having any additional complaints.  She states that she would like to be discharged home at this time.  She not having SI or HI.  Has been evaluate by psychiatry, neurology and hospitalist.  Is there is no indication for hospitalization at this time and has been cleared by psychiatry does appear appropriate for outpatient follow-up.  Will give referral to cardiology given elevated troponin which is not had any sort of chest pain or pressure.  I suspect this is a lab error given negative enzymes since the first initial elevation and no EKG changes.   Willy Eddy, MD 09/27/19 2018

## 2019-09-29 LAB — LAMOTRIGINE LEVEL: Lamotrigine Lvl: 36.8 ug/mL — ABNORMAL HIGH (ref 2.0–20.0)

## 2019-10-02 LAB — CK ISOENZYMES
CK-BB: 0 %
CK-MB: 0 % (ref 0–3)
CK-MM: 100 % (ref 97–100)
Creatine Kinase-Total: 218 U/L — ABNORMAL HIGH (ref 32–182)
Macro Type 1: 0 %
Macro Type 2: 0 %

## 2020-09-19 ENCOUNTER — Other Ambulatory Visit: Payer: Self-pay | Admitting: Orthopedic Surgery

## 2020-09-19 DIAGNOSIS — S6392XA Sprain of unspecified part of left wrist and hand, initial encounter: Secondary | ICD-10-CM

## 2020-09-19 DIAGNOSIS — M25542 Pain in joints of left hand: Secondary | ICD-10-CM

## 2020-10-21 ENCOUNTER — Ambulatory Visit
Admission: RE | Admit: 2020-10-21 | Discharge: 2020-10-21 | Disposition: A | Discharge status: Home / Self Care | Payer: Medicare Other | Attending: Gastroenterology | Admitting: Gastroenterology

## 2020-10-21 ENCOUNTER — Ambulatory Visit: Payer: Medicare Other | Admitting: Anesthesiology

## 2020-10-21 ENCOUNTER — Encounter
Admission: RE | Disposition: A | Discharge status: Home / Self Care | Payer: Self-pay | Source: Home / Self Care | Attending: Gastroenterology

## 2020-10-21 DIAGNOSIS — Z885 Allergy status to narcotic agent status: Secondary | ICD-10-CM | POA: Diagnosis not present

## 2020-10-21 DIAGNOSIS — Z7951 Long term (current) use of inhaled steroids: Secondary | ICD-10-CM | POA: Insufficient documentation

## 2020-10-21 DIAGNOSIS — Z87891 Personal history of nicotine dependence: Secondary | ICD-10-CM | POA: Diagnosis not present

## 2020-10-21 DIAGNOSIS — K219 Gastro-esophageal reflux disease without esophagitis: Secondary | ICD-10-CM | POA: Diagnosis present

## 2020-10-21 DIAGNOSIS — Z9889 Other specified postprocedural states: Secondary | ICD-10-CM | POA: Insufficient documentation

## 2020-10-21 DIAGNOSIS — G9050 Complex regional pain syndrome I, unspecified: Secondary | ICD-10-CM | POA: Diagnosis not present

## 2020-10-21 DIAGNOSIS — Z79890 Hormone replacement therapy (postmenopausal): Secondary | ICD-10-CM | POA: Diagnosis not present

## 2020-10-21 DIAGNOSIS — Z79899 Other long term (current) drug therapy: Secondary | ICD-10-CM | POA: Diagnosis not present

## 2020-10-21 HISTORY — DX: Postconcussional syndrome: F07.81

## 2020-10-21 HISTORY — DX: Cardiomegaly: I51.7

## 2020-10-21 HISTORY — DX: Osteoarthritis of knee, unspecified: M17.9

## 2020-10-21 HISTORY — DX: Hypothyroidism, unspecified: E03.9

## 2020-10-21 HISTORY — PX: ESOPHAGOGASTRODUODENOSCOPY: SHX5428

## 2020-10-21 HISTORY — DX: Complex regional pain syndrome I, unspecified: G90.50

## 2020-10-21 HISTORY — DX: Unspecified urinary incontinence: R32

## 2020-10-21 HISTORY — DX: Dyspnea, unspecified: R06.00

## 2020-10-21 HISTORY — DX: Pneumonia, unspecified organism: J18.9

## 2020-10-21 HISTORY — DX: Repeated falls: R29.6

## 2020-10-21 HISTORY — DX: Other amnesia: R41.3

## 2020-10-21 HISTORY — DX: Hyperlipidemia, unspecified: E78.5

## 2020-10-21 HISTORY — DX: Degenerative disease of nervous system, unspecified: G31.9

## 2020-10-21 HISTORY — DX: Unspecified convulsions: R56.9

## 2020-10-21 HISTORY — DX: Syncope and collapse: R55

## 2020-10-21 HISTORY — DX: Personal history of other diseases of the respiratory system: Z87.09

## 2020-10-21 HISTORY — DX: Diaphragmatic hernia without obstruction or gangrene: K44.9

## 2020-10-21 HISTORY — DX: Unilateral primary osteoarthritis, unspecified knee: M17.10

## 2020-10-21 SURGERY — EGD (ESOPHAGOGASTRODUODENOSCOPY)
Anesthesia: General

## 2020-10-21 MED ORDER — LIDOCAINE HCL (CARDIAC) PF 100 MG/5ML IV SOSY
PREFILLED_SYRINGE | INTRAVENOUS | Status: DC | PRN
  Administered 2020-10-21: 100 mg via INTRAVENOUS

## 2020-10-21 MED ORDER — DEXMEDETOMIDINE (PRECEDEX) IN NS 20 MCG/5ML (4 MCG/ML) IV SYRINGE
PREFILLED_SYRINGE | INTRAVENOUS | Status: DC | PRN
  Administered 2020-10-21: 8 ug via INTRAVENOUS
  Administered 2020-10-21: 4 ug via INTRAVENOUS

## 2020-10-21 MED ORDER — SODIUM CHLORIDE 0.9 % IV SOLN
INTRAVENOUS | Status: DC
  Administered 2020-10-21: 20 mL/h via INTRAVENOUS

## 2020-10-21 MED ORDER — PROPOFOL 10 MG/ML IV BOLUS
INTRAVENOUS | Status: DC | PRN
  Administered 2020-10-21: 20 mg via INTRAVENOUS
  Administered 2020-10-21: 70 mg via INTRAVENOUS
  Administered 2020-10-21: 30 mg via INTRAVENOUS
  Administered 2020-10-21: 20 mg via INTRAVENOUS

## 2020-10-21 NOTE — Op Note (Signed)
[facility] Patient Name: Katie Neal Procedure Date: 10/21/2020 8:15 AM MRN: [id number] Account #: [id number] Date of Birth: [birth year] Admit Type: Outpatient Age: 50 Room: [facility] ENDO ROOM 3 Gender: Female Note Status: Finalized Instrument Name: Altamese Cabal Endoscope [id number] Procedure:             Upper GI endoscopy Indications:           Gastro-esophageal reflux disease Providers:             Andrey Farmer MD, MD Referring MD:          Rubbie Battiest. Iona Beard MD, MD (Referring MD) Medicines:             Monitored Anesthesia Care Complications:         No immediate complications. Procedure:             Pre-Anesthesia Assessment:                        - Prior to the procedure, a History and Physical was                         performed, and patient medications and allergies were                         reviewed. The patient is competent. The risks and                         benefits of the procedure and the sedation options and                         risks were discussed with the patient. All questions                         were answered and informed consent was obtained.                         Patient identification and proposed procedure were                         verified by the physician, the nurse, the anesthetist                         and the technician in the endoscopy suite. Mental                         Status Examination: alert and oriented. Airway                         Examination: normal oropharyngeal airway and neck                         mobility. Respiratory Examination: clear to                         auscultation. CV Examination: normal. Prophylactic                         Antibiotics: The patient does not require prophylactic  antibiotics. Prior Anticoagulants: The patient has                         taken no previous anticoagulant or antiplatelet                         agents. ASA Grade  Assessment: III - A patient with                         severe systemic disease. After reviewing the risks and                         benefits, the patient was deemed in satisfactory                         condition to undergo the procedure. The anesthesia                         plan was to use monitored anesthesia care (MAC).                         Immediately prior to administration of medications,                         the patient was re-assessed for adequacy to receive                         sedatives. The heart rate, respiratory rate, oxygen                         saturations, blood pressure, adequacy of pulmonary                         ventilation, and response to care were monitored                         throughout the procedure. The physical status of the                         patient was re-assessed after the procedure.                        After obtaining informed consent, the endoscope was                         passed under direct vision. Throughout the procedure,                         the patient's blood pressure, pulse, and oxygen                         saturations were monitored continuously. The Endoscope                         was introduced through the mouth, and advanced to the                         second part of duodenum. The upper GI endoscopy was  somewhat difficult due to presence of food. The                         patient tolerated the procedure well. Findings:      The examined esophagus was normal.      A large amount of food (residue) was found in the gastric body.      Evidence of a Nissen fundoplication was found at the gastroesophageal       junction. The wrap appeared intact.      The exam of the stomach was otherwise normal.      The examined duodenum was normal. There was no evidence of gastric       outlet obstruction. Impression:            - Normal esophagus.                        - A large amount of  food (residue) in the stomach.                        - A Nissen fundoplication was found. The wrap appears                         intact.                        - Normal examined duodenum.                        - No specimens collected. Recommendation:        - Discharge patient to home.                        - Resume previous diet.                        - Continue present medications.                        - Do a gastric emptying study at appointment to be                         scheduled.                        - Return to referring physician as previously                         scheduled. Procedure Code(s):     --- Professional ---                        [phone], Esophagogastroduodenoscopy, flexible,                         transoral; diagnostic, including collection of                         specimen(s) by brushing or washing, when performed                         (separate procedure) Diagnosis Code(s):     --- Professional ---  Z98.890, Other specified postprocedural states                        K21.9, Gastro-esophageal reflux disease without                         esophagitis CPT copyright 2019 [employer]. All rights reserved. The codes documented in this report are preliminary and upon coder review may  be revised to meet current compliance requirements. Andrey Farmer MD, MD 10/21/2020 8:48:44 AM Number of Addenda: 0 Note Initiated On: 10/21/2020 8:15 AM Estimated Blood Loss:  Estimated blood loss: none.      [facility]

## 2020-10-21 NOTE — Interval H&P Note (Signed)
History and Physical Interval Note:  10/21/2020 8:31 AM  Katie Neal  has presented today for surgery, with the diagnosis of GERD.  The various methods of treatment have been discussed with the patient and family. After consideration of risks, benefits and other options for treatment, the patient has consented to  Procedure(s): ESOPHAGOGASTRODUODENOSCOPY (EGD) (N/A) as a surgical intervention.  The patient's history has been reviewed, patient examined, no change in status, stable for surgery.  I have reviewed the patient's chart and labs.  Questions were answered to the patient's satisfaction.     Regis Bill  Ok to proceed with EGD

## 2020-10-21 NOTE — Anesthesia Preprocedure Evaluation (Addendum)
Anesthesia Evaluation  Patient identified by MRN, date of birth, ID band Patient awake    Reviewed: Allergy & Precautions, NPO status , Patient's Chart, lab work & pertinent test results  History of Anesthesia Complications Negative for: history of anesthetic complications  Airway Mallampati: III  TM Distance: >3 FB Neck ROM: Full    Dental  (+) Edentulous Upper, Chipped, Missing,    Pulmonary asthma , sleep apnea and Continuous Positive Airway Pressure Ventilation , COPD,  COPD inhaler, former smoker,    Pulmonary exam normal        Cardiovascular Exercise Tolerance: Poor (-) hypertension(-) Past MI and (-) CHF + dysrhythmias (postural tachycardia) (-) Valvular Problems/Murmurs Rhythm:Regular Rate:Tachycardia     Neuro/Psych Seizures - (Seizure like activity followed by neurologist),  Anxiety Depression  Neuromuscular disease (CRPS)    GI/Hepatic Neg liver ROS, hiatal hernia (s/p repair), GERD  ,  Endo/Other  neg diabetesHypothyroidism   Renal/GU      Musculoskeletal  (+) Arthritis , Osteoarthritis,    Abdominal (+) + obese,   Peds  Hematology   Anesthesia Other Findings   Reproductive/Obstetrics                            Anesthesia Physical  Anesthesia Plan  ASA: III  Anesthesia Plan: General   Post-op Pain Management:    Induction: Intravenous  PONV Risk Score and Plan: 3 and Treatment may vary due to age or medical condition and TIVA  Airway Management Planned: Natural Airway and Nasal Cannula  Additional Equipment:   Intra-op Plan:   Post-operative Plan: Extubation in OR  Informed Consent: I have reviewed the patients History and Physical, chart, labs and discussed the procedure including the risks, benefits and alternatives for the proposed anesthesia with the patient or authorized representative who has indicated his/her understanding and acceptance.     Dental  advisory given  Plan Discussed with: Anesthesiologist and CRNA  Anesthesia Plan Comments: (Pt inquired about ketamine use to avoid the potential of CRPS of esophagus. I discussed the pros/cons of ketamine and propofol. Pt agreed to use propofol. )       Anesthesia Quick Evaluation

## 2020-10-21 NOTE — Transfer of Care (Signed)
Immediate Anesthesia Transfer of Care Note  Patient: Katie Neal  Procedure(s) Performed: ESOPHAGOGASTRODUODENOSCOPY (EGD)  Patient Location: PACU  Anesthesia Type:General  Level of Consciousness: awake, alert  and oriented  Airway & Oxygen Therapy: Patient Spontanous Breathing  Post-op Assessment: Report given to RN and Post -op Vital signs reviewed and stable  Post vital signs: Reviewed and stable  Last Vitals:  Vitals Value Taken Time  BP    Temp    Pulse 93 10/21/20 0849  Resp 11 10/21/20 0849  SpO2 92 % 10/21/20 0849  Vitals shown include unvalidated device data.  Last Pain:  Vitals:   10/21/20 0750  TempSrc: Temporal  PainSc: 5          Complications: No notable events documented.

## 2020-10-21 NOTE — H&P (Signed)
Outpatient short stay form Pre-procedure 10/21/2020  Regis Bill, MD  Primary Physician: Rayetta Humphrey, MD  Reason for visit:  GERD  History of present illness:   50 y/o lady with complex regional pain syndrome, hx of aspiration pneumonias, and history of nissen fundoplication here for EGD for recurrent GERD. States she has persistent dysphagia symptoms. GERD symptoms restarted 4-5 months ago for which she takes a PPI. No blood thinners. History of multiple abdominal surgeries including appendectomy, hysterectomy, and other laprascopic surgeries.    Current Facility-Administered Medications:    0.9 %  sodium chloride infusion, , Intravenous, Continuous, Marsel Gail, Rossie Muskrat, MD, Last Rate: 20 mL/hr at 10/21/20 0816, Continued from Pre-op at 10/21/20 0816  Medications Prior to Admission  Medication Sig Dispense Refill Last Dose   albuterol (PROVENTIL) (2.5 MG/3ML) 0.083% nebulizer solution Take 2.5 mg by nebulization every 6 (six) hours as needed for wheezing or shortness of breath.    10/20/2020   amitriptyline (ELAVIL) 25 MG tablet Take 25 mg by mouth at bedtime.    10/20/2020   BENRALIZUMAB [place] Inject into the skin.   Past Week   budesonide (PULMICORT) 0.25 MG/2ML nebulizer solution Take 0.25 mg by nebulization 2 (two) times daily.   10/20/2020   budesonide-formoterol (SYMBICORT) 80-4.5 MCG/ACT inhaler Inhale 2 puffs into the lungs 2 (two) times daily.   10/20/2020   dicyclomine (BENTYL) 20 MG tablet Take 20 mg by mouth 3 (three) times daily before meals.   10/21/2020 at 0500   fluticasone (FLONASE) 50 MCG/ACT nasal spray Place 2 sprays into both nostrils daily as needed.    10/20/2020   gabapentin (NEURONTIN) 100 MG capsule Take 100 mg by mouth 3 (three) times daily.   10/21/2020 at 0500   hydrOXYzine (VISTARIL) 25 MG capsule Take 25 mg by mouth 3 (three) times daily.   10/20/2020   ipratropium (ATROVENT) 0.03 % nasal spray Place 2 sprays into both nostrils every 12 (twelve) hours.    Past Month   levocetirizine (XYZAL) 5 MG tablet SMARTSIG:1 Tablet(s) By Mouth Every Evening   10/20/2020   levorphanol (LEVODROMORAN) 2 MG tablet Take 2 mg by mouth every 8 (eight) hours as needed for pain.    10/21/2020 at 0500   levothyroxine (SYNTHROID, LEVOTHROID) 137 MCG tablet Take 1 tablet by mouth 6 days/week. Take half tablet (68.5 mcg) on Sunday. Take on an empty stomach with a glass of water at least 30-60 minutes before breakfast.   10/21/2020 at 0500   lidocaine (LIDODERM) 5 % Place 1 patch onto the skin daily. Remove & Discard patch within 12 hours or as directed by MD   Past Month   metoprolol succinate (TOPROL-XL) 25 MG 24 hr tablet Take 25 mg by mouth daily.   10/20/2020   montelukast (SINGULAIR) 10 MG tablet Take 10 mg by mouth daily.   10/21/2020 at 0500   Multiple Vitamin (MULTIVITAMIN WITH MINERALS) TABS tablet Take 1 tablet by mouth daily.   10/20/2020   Olopatadine HCl 0.7 % SOLN Place 1 drop into both eyes 2 (two) times daily as needed (allergy symptoms).    Past Month   ondansetron (ZOFRAN-ODT) 4 MG disintegrating tablet Take 4 mg by mouth every 8 (eight) hours as needed for nausea.   10/21/2020 at 0500   pantoprazole (PROTONIX) 40 MG tablet Take 40 mg by mouth daily.   10/21/2020 at 0500   QUEtiapine (SEROQUEL) 25 MG tablet Take 25 mg by mouth at bedtime.   10/20/2020   simvastatin (  ZOCOR) 10 MG tablet Take 10 mg by mouth at bedtime.    10/21/2020 at 0500   tiotropium (SPIRIVA) 18 MCG inhalation capsule Place 18 mcg into inhaler and inhale daily.   Past Month   tiZANidine (ZANAFLEX) 4 MG tablet Take 4 mg by mouth every 8 (eight) hours as needed for muscle spasms.   10/21/2020 at 0500   traZODone (DESYREL) 100 MG tablet Take 2 tablets (200 mg total) by mouth at bedtime. Needs office visit 60 tablet 0 10/20/2020   venlafaxine XR (EFFEXOR-XR) 75 MG 24 hr capsule Take 225 mg by mouth daily with breakfast.   10/21/2020 at 0500   vitamin C (ASCORBIC ACID) 500 MG tablet Take 1,000 mg by mouth  daily.   10/21/2020 at 0500   lamoTRIgine (LAMICTAL) 100 MG tablet Take 100-200 mg by mouth See admin instructions. Take 1 tablet (100mg ) by mouth every morning and take 2 tablets (200mg ) by mouth at bedtime        Allergies  Allergen Reactions   Codeine Itching    Can take with benadryl      Past Medical History:  Diagnosis Date   Arthritis    Asthma    Cardiomegaly    Cerebral atrophy (HCC)    Complex regional pain syndrome type 1    COPD (chronic obstructive pulmonary disease) (HCC)    CRPS (complex regional pain syndrome type I)    Depression    Diaphragmatic hernia    Dyspnea    Excessive falling    Headache    History of chronic sinusitis    History of kidney stones    Hyperlipidemia    Hypothyroidism    IBS (irritable bowel syndrome)    Insomnia    Memory difficulty    Osteoarthritis of knee    Pneumonia    Post concussion syndrome    PTSD (post-traumatic stress disorder)    Seizure-like activity (HCC)    Sleep apnea    not on Cpap machine but is on oxygen    Urinary incontinence    Vasovagal syncope     Review of systems:  Otherwise negative.    Physical Exam  Gen: Alert, oriented. Appears stated age.  HEENT: PERRLA. Lungs: No respiratory distress CV: RRR Abd: soft, benign, no masses Ext: No edema    Planned procedures: Proceed with EGD. The patient understands the nature of the planned procedure, indications, risks, alternatives and potential complications including but not limited to bleeding, infection, perforation, damage to internal organs and possible oversedation/side effects from anesthesia. The patient agrees and gives consent to proceed.  Please refer to procedure notes for findings, recommendations and patient disposition/instructions.     , MD [facility] [place] [facility]

## 2020-10-21 NOTE — Anesthesia Postprocedure Evaluation (Signed)
Anesthesia Post Note  Patient: [name]  Procedure(s) Performed: ESOPHAGOGASTRODUODENOSCOPY (EGD)  Patient location during evaluation: Endoscopy Anesthesia Type: General Level of consciousness: awake and alert Pain management: pain level controlled Vital Signs Assessment: post-procedure vital signs reviewed and stable Respiratory status: spontaneous breathing, nonlabored ventilation and respiratory function stable Cardiovascular status: blood pressure returned to baseline and stable Postop Assessment: no apparent nausea or vomiting Anesthetic complications: no   No notable events documented.   Last Vitals:  Vitals:   10/21/20 0846 10/21/20 0906  BP: 104/75   Pulse: 91   Resp: 19   Temp: (!) 36.1 C   SpO2: 94% 98%    Last Pain:  Vitals:   10/21/20 0906  TempSrc:   PainSc: 0-No pain                 Foye Deer

## 2020-10-24 ENCOUNTER — Encounter: Payer: Self-pay | Admitting: Gastroenterology

## 2020-11-08 ENCOUNTER — Other Ambulatory Visit: Payer: Self-pay | Admitting: Gastroenterology

## 2020-11-08 DIAGNOSIS — K219 Gastro-esophageal reflux disease without esophagitis: Secondary | ICD-10-CM

## 2021-10-02 ENCOUNTER — Encounter: Payer: Self-pay | Admitting: Physical Therapy

## 2021-10-02 ENCOUNTER — Ambulatory Visit: Payer: Medicare Other | Attending: Internal Medicine | Admitting: Physical Therapy

## 2021-10-02 ENCOUNTER — Other Ambulatory Visit: Payer: Self-pay

## 2021-10-02 DIAGNOSIS — M6281 Muscle weakness (generalized): Secondary | ICD-10-CM | POA: Diagnosis present

## 2021-10-02 DIAGNOSIS — R296 Repeated falls: Secondary | ICD-10-CM | POA: Insufficient documentation

## 2021-10-02 DIAGNOSIS — R262 Difficulty in walking, not elsewhere classified: Secondary | ICD-10-CM | POA: Diagnosis present

## 2021-10-02 NOTE — Therapy (Unsigned)
OUTPATIENT PHYSICAL THERAPY LOWER EXTREMITY EVALUATION   Patient Name: Katie Neal MRN: [id number] DOB:10/03/1970, 51 y.o., female Today's Date: 10/03/2021   PT End of Session - 10/03/21 0826     Visit Number 1    Number of Visits 20    Date for PT Re-Evaluation 12/12/21    Authorization Type Medicare 2023    Authorization - Visit Number 1    Progress Note Due on Visit 10    PT Start Time 1545    PT Stop Time 1630    PT Time Calculation (min) 45 min    Equipment Utilized During Treatment Gait belt    Activity Tolerance Patient limited by pain    Behavior During Therapy WFL for tasks assessed/performed             Past Medical History:  Diagnosis Date   Arthritis    Asthma    Cardiomegaly    Cerebral atrophy (HCC)    Complex regional pain syndrome type 1    COPD (chronic obstructive pulmonary disease) (HCC)    CRPS (complex regional pain syndrome type I)    Depression    Diaphragmatic hernia    Dyspnea    Excessive falling    Headache    History of chronic sinusitis    History of kidney stones    Hyperlipidemia    Hypothyroidism    IBS (irritable bowel syndrome)    Insomnia    Memory difficulty    Osteoarthritis of knee    Pneumonia    Post concussion syndrome    PTSD (post-traumatic stress disorder)    Seizure-like activity (HCC)    Sleep apnea    not on Cpap machine but is on oxygen    Urinary incontinence    Vasovagal syncope    Past Surgical History:  Procedure Laterality Date   ABDOMINAL HYSTERECTOMY     ACHILLES TENDON REPAIR  1982   APPENDECTOMY     CYSTOSCOPY WITH STENT PLACEMENT Right 06/27/2019   Procedure: CYSTOSCOPY WITH STENT PLACEMENT;  Surgeon: Crista Elliot, MD;  Location: [facility];  Service: Urology;  Laterality: Right;   CYSTOSCOPY/RETROGRADE/URETEROSCOPY  07/14/2019   Procedure: CYSTOSCOPY/RETROGRADE/URETEROSCOPY;  Surgeon: Riki Altes, MD;  Location: [facility];  Service: Urology;;   CYSTOSCOPY/URETEROSCOPY/HOLMIUM  LASER/STENT PLACEMENT Right 07/14/2019   Procedure: CYSTOSCOPY/URETEROSCOPY/HOLMIUM LASER/STENT Exchange;  Surgeon: Riki Altes, MD;  Location: [facility];  Service: Urology;  Laterality: Right;   ESOPHAGOGASTRODUODENOSCOPY N/A 10/21/2020   Procedure: ESOPHAGOGASTRODUODENOSCOPY (EGD);  Surgeon: Regis Bill, MD;  Location: [facility];  Service: Endoscopy;  Laterality: N/A;   FOOT SURGERY  2002   due to deformity   HIP SURGERY  2012   KNEE ARTHROSCOPY     LAPAROSCOPIC HYSTERECTOMY  2000   REPAIR TENDONS LEG     x2   Patient Active Problem List   Diagnosis Date Noted   Elevated troponin 09/27/2019   IBS (irritable bowel syndrome) 08/07/2019   Fever    Right lower quadrant abdominal pain    Right ureteral stone 06/27/2019   Sepsis (HCC) 06/27/2019   Acute pyelonephritis 06/27/2019   Weakness    Seizure (HCC) 12/30/2018   CRPS (complex regional pain syndrome type I)    HLD (hyperlipidemia)    Lower urinary tract infectious disease    Fall    Hypothyroidism    Headache disorder 05/06/2018   Dizziness 01/30/2018   Post concussion syndrome 01/30/2018   Vasovagal syncope 11/27/2017   Seizure-like activity (HCC) 11/27/2017  Concussion with no loss of consciousness, initial encounter 10/23/2017   Excessive falling 10/23/2017   Urinary incontinence 10/23/2017   Chronic obstructive pulmonary disease ([place]) 09/09/2017   Obesity (BMI 35.0-39.9 without comorbidity) 06/07/2017   Transient neurological symptoms 05/24/2017   History of chronic sinusitis 12/21/2016   Abnormal PFTs 12/06/2015   Adjustment disorder with depressed mood 06/17/2015   Acute urinary retention 04/05/2015   Gross hematuria 04/05/2015   Acute non-recurrent maxillary sinusitis 10/20/2014   History of environmental allergies 07/08/2014   On home oxygen therapy 07/07/2014   Gastroparesis 06/28/2014   Pharyngoesophageal dysphagia 06/01/2014   ANA positive 05/11/2014   Anemia 05/11/2014   Shortness of  breath 04/25/2014   Hypoxia 04/16/2014   Hiatal hernia 04/16/2014   Former tobacco use 04/16/2014   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   Hypoxemia requiring supplemental oxygen 03/22/2014   Globus sensation 03/20/2014   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   Midepigastric pain 03/20/2014   Pneumonia due to organism 03/19/2014   Tachycardia 03/02/2014   POTS (postural orthostatic tachycardia syndrome) 03/02/2014   Pain in joint, forearm 12/07/2013   Bilateral hand pain 10/28/2013   Insomnia 09/18/2013   High risk medication use 05/27/2013   Neurogenic pain of left foot 05/07/2013   Depression 04/08/2013   Major depressive disorder, recurrent episode, moderate ([place]) 04/08/2013   Closed fracture of metatarsal bone 02/16/2013   Low back pain 02/10/2013   OA (osteoarthritis) of knee 08/27/2012   Osteopenia 06/20/2012   H/O arthroplasty 09/20/2011   ACL graft tear ([place]) 08/02/2011   Sprain of medial collateral ligament of knee 08/02/2011    PCP: Dr. Alice Reichert   REFERRING PROVIDER: Dr. Harrel Lemon   REFERRING DIAG: Ataxia   THERAPY DIAG:  Repeated falls  Muscle weakness (generalized)  Rationale for Evaluation and Treatment Rehabilitation  ONSET DATE: 01/2013   SUBJECTIVE:   SUBJECTIVE STATEMENT: See pertinent history below   PERTINENT HISTORY: Patient has an extensive medical history which she believes is contributing to her current falls. The main reason she believes she is having fall is because of chronic regional pain syndrome, which she was orginally diagnosed with in 2015. She is in a constant state of pain especially in LEs . She has h/o of partial knee replacement on left knee as well as multiple ACL tears on left knee. She also feels dizzy all the time which she attributes to her low blood oxygen levels and low blood pressure.   PAIN:  Are you having pain? Yes: NPRS scale: 8/10 Pain location: Right toes and right knee  Pain  description: Achy and sharp Aggravating factors: Cold objects make it worse  Relieving factors: Sleep, but not much else   PRECAUTIONS: Fall  WEIGHT BEARING RESTRICTIONS No  FALLS:  Has patient fallen in last 6 months? Yes. Number of falls 8-9  LIVING ENVIRONMENT: Lives with: lives with their son Lives in: House/apartment Stairs: No Has following equipment at home:  Upright walker and SPC and [place] crutches and Manual WC    OCCUPATION: Disabled   PLOF: Needs assistance with ADLs  PATIENT GOALS To get a wheelchair evaluation for an electric WC. Increase ability to sit and stand longer and stop falling.    OBJECTIVE:               VITALS: BP 115/75 HR 96 SpO2 95   DIAGNOSTIC FINDINGS:   CLINICAL DATA:  Seizure.  Projectile vomiting.   EXAM: CT HEAD WITHOUT CONTRAST   TECHNIQUE: Contiguous  axial images were obtained from the base of the skull through the vertex without intravenous contrast.   COMPARISON:  12/30/2018   FINDINGS: Brain: Age advanced cerebral atrophy again identified. No mass lesion, hemorrhage, hydrocephalus, acute infarct, intra-axial, or extra-axial fluid collection.   Vascular: No hyperdense vessel or unexpected calcification.   Skull: No significant soft tissue swelling.  No skull fracture.   Sinuses/Orbits: Normal imaged portions of the orbits and globes. Mucosal thickening of ethmoid air cells.   Other: None.   IMPRESSION: 1. No acute intracranial abnormality. 2. Age advanced cerebral atrophy. 3. Sinus disease.  PATIENT SURVEYS:  FOTO Not performed   COGNITION:  Overall cognitive status: Within functional limits for tasks assessed     SENSATION: Light touch: Impaired Numbness and tingling around entire leg   EDEMA: Not evidenced    MUSCLE LENGTH: Not evidenced   POSTURE: rounded shoulders and forward head  PALPATION: Not performed   LOWER EXTREMITY ROM:  WFL   LOWER EXTREMITY MMT:  MMT Right eval Left eval   Hip flexion 3+ 3+   Hip extension    Hip abduction    Hip adduction    Hip internal rotation    Hip external rotation    Knee flexion 3+ 3+  Knee extension 3+ 3+  Ankle dorsiflexion    Ankle plantarflexion    Ankle inversion    Ankle eversion     (Blank rows = not tested)   FUNCTIONAL TESTS:  5 times sit to stand: 26 sec  30 seconds chair stand test: 6 reps  Berg Balance Scale: NT   STEADI 4-Stage Balance Test: (inability to maintain tandem stance for 10 sec = increased risk for falls) - feet together, eyes open, noncompliant surface: 10 sec - semi-tandem stance, eyes open, noncompliant surface: 3/3 sec - tandem stances, eyes open, noncompliant surface: NT  - single leg stance:         - R LE: NT          - L LE: NT   GAIT: Distance walked: 23 ft  Assistive device utilized: None Level of assistance: SBA Comments: Wide base of support and shuffling gait     TODAY'S TREATMENT:             Romberg 2 x 10 sec  Half tandem stance 2 x 10 sec    PATIENT EDUCATION:  Education details: form and technique for appropriate exercise and explanation about tests and measures  Person educated: Patient Education method: Medical illustrator Education comprehension: verbalized understanding and returned demonstration   HOME EXERCISE PROGRAM: Access Code: [email or web address] URL: [email or web address] Date: 10/02/2021 Prepared by: Ellin Goodie  Exercises - Half Tandem Stance Balance with Eyes Closed  - 1 x daily - 7 x weekly - 5 reps - 10 hold  ASSESSMENT:  CLINICAL IMPRESSION: Patient is a 51 y.o. white female who was seen today for physical therapy evaluation and treatment for repeated falls in the setting of generalized weakness from complex regional pain syndrome. She exhibits increased weakness, decreased mobility, and increased pain with mobility. She will benefit from skilled PT to improve previous deficits to decrease her risk of falling and  continuing to sustain injuries from fall to remain independent and decrease caregiver burden. Given nature of CRPS and centralization of pain, pt will benefit from a psychologically informed approach to PT treatment.    OBJECTIVE IMPAIRMENTS Abnormal gait, decreased activity tolerance, decreased balance, decreased mobility, difficulty walking, decreased strength, dizziness, impaired sensation, and pain.  ACTIVITY LIMITATIONS carrying, lifting, bending, sitting, standing, squatting, sleeping, stairs, transfers, bed mobility, dressing, and hygiene/grooming  PARTICIPATION LIMITATIONS: meal prep, cleaning, laundry, driving, shopping, community activity, and yard work  PERSONAL FACTORS Age, Fitness, Past/current experiences, Time since onset of injury/illness/exacerbation, and 3+ comorbidities: Complex Regional Pain Syndrome, Depression, Seizure like Activity, COPD   are also affecting patient's functional outcome.   REHAB POTENTIAL: Fair Multiple bouts of PT in past and chronicity of ongoing neurological and MSK issues   CLINICAL DECISION MAKING: Evolving/moderate complexity  EVALUATION COMPLEXITY: Moderate   GOALS: Goals reviewed with patient? No  SHORT TERM GOALS: Target date: 10/17/2021  Pt will be independent with HEP in order to improve strength and balance in order to decrease fall risk and improve function at home and work. Baseline: NT  Goal status: INITIAL  2.  Pt will decrease 5TSTS by at least 3 seconds in order to demonstrate clinically significant improvement in LE strength. Baseline: 23 sec   Goal status: INITIAL    LONG TERM GOALS: Target date: 12/12/2021   Patient will have improved function and activity level as evidenced by an increase in FOTO score by 10 points or more.  Baseline: NT  Goal status: INITIAL  2. Pt will improve BERG by at least 3 points in order to demonstrate clinically significant improvement in balance.  Baseline: NT  Goal status: INITIAL  3.   Pt will decrease TUG to below 14 seconds in order to demonstrate decreased fall risk. Baseline: NT  Goal status: INITIAL   4.  Patient will be able to perform tandem stance on RLE and LLE to show improved static balance and decreased risk of falling.  Baseline: Romberg  Goal status: INITIAL    PLAN: PT FREQUENCY: 1-2x/week  PT DURATION: 10 weeks  PLANNED INTERVENTIONS: Therapeutic exercises, Therapeutic activity, Neuromuscular re-education, Balance training, Gait training, Patient/Family education, Self Care, Joint mobilization, Stair training, Vestibular training, DME instructions, Aquatic Therapy, Dry Needling, Electrical stimulation, Spinal manipulation, Spinal mobilization, Cryotherapy, Moist heat, Manual therapy, and Re-evaluation  PLAN FOR NEXT SESSION: Take FOTO. Test strength of DF. Perform TUG and BERG   Bradly Chris PT, DPT 10/03/2021, 8:31 AM

## 2021-10-04 ENCOUNTER — Telehealth: Payer: Self-pay | Admitting: Physical Therapy

## 2021-10-04 ENCOUNTER — Ambulatory Visit: Payer: Medicare Other | Admitting: Physical Therapy

## 2021-10-04 NOTE — Telephone Encounter (Signed)
Called pt to inquire about missed apt. She did not pickup so left VM instructing pt to call back to reschedule.

## 2021-10-04 NOTE — Telephone Encounter (Signed)
See last phone note.

## 2021-10-05 ENCOUNTER — Ambulatory Visit: Payer: Medicare Other | Admitting: Physical Therapy

## 2021-10-09 ENCOUNTER — Ambulatory Visit: Payer: Medicare Other | Admitting: Physical Therapy

## 2021-10-11 ENCOUNTER — Encounter: Payer: Medicare Other | Admitting: Physical Therapy

## 2021-10-16 ENCOUNTER — Ambulatory Visit: Payer: Medicare Other

## 2021-10-16 DIAGNOSIS — R296 Repeated falls: Secondary | ICD-10-CM | POA: Diagnosis not present

## 2021-10-16 DIAGNOSIS — M6281 Muscle weakness (generalized): Secondary | ICD-10-CM

## 2021-10-16 DIAGNOSIS — R262 Difficulty in walking, not elsewhere classified: Secondary | ICD-10-CM

## 2021-10-16 NOTE — Therapy (Signed)
OUTPATIENT PHYSICAL THERAPY TREATMENT NOTE   Patient Name: Katie Neal MRN: [id number] DOB:April 28, 1970, 51 y.o., female Today's Date: 10/16/2021   PT End of Session - 10/16/21 1607     Visit Number 2    Number of Visits 20    Date for PT Re-Evaluation 12/12/21    Authorization Type Medicare 2023    Authorization - Visit Number 1    Progress Note Due on Visit 10    PT Start Time 5284    PT Stop Time 1630    PT Time Calculation (min) 32 min    Equipment Utilized During Treatment Gait belt    Activity Tolerance Patient limited by pain    Behavior During Therapy WFL for tasks assessed/performed             Past Medical History:  Diagnosis Date   Arthritis    Asthma    Cardiomegaly    Cerebral atrophy ([place])    Complex regional pain syndrome type 1    COPD (chronic obstructive pulmonary disease) (HCC)    CRPS (complex regional pain syndrome type I)    Depression    Diaphragmatic hernia    Dyspnea    Excessive falling    Headache    History of chronic sinusitis    History of kidney stones    Hyperlipidemia    Hypothyroidism    IBS (irritable bowel syndrome)    Insomnia    Memory difficulty    Osteoarthritis of knee    Pneumonia    Post concussion syndrome    PTSD (post-traumatic stress disorder)    Seizure-like activity ([place])    Sleep apnea    not on Cpap machine but is on oxygen    Urinary incontinence    Vasovagal syncope    Past Surgical History:  Procedure Laterality Date   ABDOMINAL HYSTERECTOMY     ACHILLES TENDON REPAIR  1982   APPENDECTOMY     CYSTOSCOPY WITH STENT PLACEMENT Right 06/27/2019   Procedure: CYSTOSCOPY WITH STENT PLACEMENT;  Surgeon: Lucas Mallow, MD;  Location: [facility];  Service: Urology;  Laterality: Right;   CYSTOSCOPY/RETROGRADE/URETEROSCOPY  07/14/2019   Procedure: CYSTOSCOPY/RETROGRADE/URETEROSCOPY;  Surgeon: Abbie Sons, MD;  Location: [facility];  Service: Urology;;   CYSTOSCOPY/URETEROSCOPY/HOLMIUM LASER/STENT  PLACEMENT Right 07/14/2019   Procedure: CYSTOSCOPY/URETEROSCOPY/HOLMIUM LASER/STENT Exchange;  Surgeon: Abbie Sons, MD;  Location: [facility];  Service: Urology;  Laterality: Right;   ESOPHAGOGASTRODUODENOSCOPY N/A 10/21/2020   Procedure: ESOPHAGOGASTRODUODENOSCOPY (EGD);  Surgeon: Lesly Rubenstein, MD;  Location: [facility] ENDOSCOPY;  Service: Endoscopy;  Laterality: N/A;   FOOT SURGERY  2002   due to deformity   HIP SURGERY  2012   KNEE ARTHROSCOPY     LAPAROSCOPIC HYSTERECTOMY  2000   REPAIR TENDONS LEG     x2   Patient Active Problem List   Diagnosis Date Noted   Elevated troponin 09/27/2019   IBS (irritable bowel syndrome) 08/07/2019   Fever    Right lower quadrant abdominal pain    Right ureteral stone 06/27/2019   Sepsis ([place]) 06/27/2019   Acute pyelonephritis 06/27/2019   Weakness    Seizure ([place]) 12/30/2018   CRPS (complex regional pain syndrome type I)    HLD (hyperlipidemia)    Lower urinary tract infectious disease    Fall    Hypothyroidism    Headache disorder 05/06/2018   Dizziness 01/30/2018   Post concussion syndrome 01/30/2018   Vasovagal syncope 11/27/2017   Seizure-like activity ([place]) 11/27/2017  Concussion with no loss of consciousness, initial encounter 10/23/2017   Excessive falling 10/23/2017   Urinary incontinence 10/23/2017   Chronic obstructive pulmonary disease ([place]) 09/09/2017   Obesity (BMI 35.0-39.9 without comorbidity) 06/07/2017   Transient neurological symptoms 05/24/2017   History of chronic sinusitis 12/21/2016   Abnormal PFTs 12/06/2015   Adjustment disorder with depressed mood 06/17/2015   Acute urinary retention 04/05/2015   Gross hematuria 04/05/2015   Acute non-recurrent maxillary sinusitis 10/20/2014   History of environmental allergies 07/08/2014   On home oxygen therapy 07/07/2014   Gastroparesis 06/28/2014   Pharyngoesophageal dysphagia 06/01/2014   ANA positive 05/11/2014   Anemia 05/11/2014   Shortness of breath  04/25/2014   Hypoxia 04/16/2014   Hiatal hernia 04/16/2014   Former tobacco use 04/16/2014   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   Hypoxemia requiring supplemental oxygen 03/22/2014   Globus sensation 03/20/2014   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   Midepigastric pain 03/20/2014   Pneumonia due to organism 03/19/2014   Tachycardia 03/02/2014   POTS (postural orthostatic tachycardia syndrome) 03/02/2014   Pain in joint, forearm 12/07/2013   Bilateral hand pain 10/28/2013   Insomnia 09/18/2013   High risk medication use 05/27/2013   Neurogenic pain of left foot 05/07/2013   Depression 04/08/2013   Major depressive disorder, recurrent episode, moderate ([place]) 04/08/2013   Closed fracture of metatarsal bone 02/16/2013   Low back pain 02/10/2013   OA (osteoarthritis) of knee 08/27/2012   Osteopenia 06/20/2012   H/O arthroplasty 09/20/2011   ACL graft tear ([place]) 08/02/2011   Sprain of medial collateral ligament of knee 08/02/2011    PCP: Dr. Alice Reichert   REFERRING PROVIDER: Dr. Harrel Lemon   REFERRING DIAG: Ataxia   THERAPY DIAG:  Difficulty in walking, not elsewhere classified  Muscle weakness (generalized)  Repeated falls  Rationale for Evaluation and Treatment Rehabilitation  ONSET DATE: 01/2013   SUBJECTIVE:   SUBJECTIVE STATEMENT:  Pt reports she had difficulty with performing exercise due to balance related issue and has to perform the rhomberg stance with back against wall otherwise she would fall flat.  Pt notes she is still having higher pain levels due to Chronic Regional Pain Syndrome.  PERTINENT HISTORY:  Patient has an extensive medical history which she believes is contributing to her current falls. The main reason she believes she is having fall is because of chronic regional pain syndrome, which she was orginally diagnosed with in 2015. She is in a constant state of pain especially in LEs . She has h/o of partial knee  replacement on left knee as well as multiple ACL tears on left knee. She also feels dizzy all the time which she attributes to her low blood oxygen levels and low blood pressure.   PAIN:  Are you having pain?   PRECAUTIONS: Fall  WEIGHT BEARING RESTRICTIONS No  FALLS:  Has patient fallen in last 6 months? Yes. Number of falls 8-9  LIVING ENVIRONMENT: Lives with: lives with their son Lives in: House/apartment Stairs: No Has following equipment at home:  Upright walker and SPC and [place] crutches and Manual WC    OCCUPATION: Disabled   PLOF: Needs assistance with ADLs  PATIENT GOALS To get a wheelchair evaluation for an electric WC. Increase ability to sit and stand longer and stop falling.    OBJECTIVE:   VITALS: BP 115/75 HR 96 SpO2 95   DIAGNOSTIC FINDINGS:   CLINICAL DATA:  Seizure.  Projectile vomiting.   EXAM: CT HEAD WITHOUT  CONTRAST   TECHNIQUE: Contiguous axial images were obtained from the base of the skull through the vertex without intravenous contrast.   COMPARISON:  12/30/2018   FINDINGS: Brain: Age advanced cerebral atrophy again identified. No mass lesion, hemorrhage, hydrocephalus, acute infarct, intra-axial, or extra-axial fluid collection.   Vascular: No hyperdense vessel or unexpected calcification.   Skull: No significant soft tissue swelling.  No skull fracture.   Sinuses/Orbits: Normal imaged portions of the orbits and globes. Mucosal thickening of ethmoid air cells.   Other: None.   IMPRESSION: 1. No acute intracranial abnormality. 2. Age advanced cerebral atrophy. 3. Sinus disease.  PATIENT SURVEYS:  FOTO 34/100; Goal 40  COGNITION: Overall cognitive status: Within functional limits for tasks assessed     SENSATION: Light touch: Impaired Numbness and tingling around entire leg   EDEMA: Not evidenced    MUSCLE LENGTH: Not evidenced   POSTURE: rounded shoulders and forward head  PALPATION: Not performed   LOWER  EXTREMITY ROM:  WFL   LOWER EXTREMITY MMT:  MMT Right eval Left eval  Hip flexion 3+ 3+   Hip extension    Hip abduction    Hip adduction    Hip internal rotation    Hip external rotation    Knee flexion 3+ 3+  Knee extension 3+ 3+  Ankle dorsiflexion    Ankle plantarflexion    Ankle inversion    Ankle eversion     (Blank rows = not tested)   FUNCTIONAL TESTS:  5 times sit to stand: 26 sec  30 seconds chair stand test: 6 reps  Berg Balance Scale: NT   STEADI 4-Stage Balance Test: (inability to maintain tandem stance for 10 sec = increased risk for falls) - feet together, eyes open, noncompliant surface: 10 sec - semi-tandem stance, eyes open, noncompliant surface: 3/3 sec - tandem stances, eyes open, noncompliant surface: NT  - single leg stance:         - R LE: NT          - L LE: NT   GAIT: Distance walked: 23 ft  Assistive device utilized: None Level of assistance: SBA Comments: Wide base of support and shuffling gait    TODAY'S TREATMENT:  TherAct:  Assessment of tests and measures for goal assessment.  FOTO: 34/100 BERG: 44/56 TUG: 25.21 sec   TherEx:  Standing mini squats with B UE support, x10 Standing marches with B UE support, x10 Standing hip abduction with B UE support, x10 each LE Standing hip extension with B UE support, x10 each LE Standing calf raises with B UE support, x10    PATIENT EDUCATION:  Education details: form and technique for appropriate exercise and explanation about tests and measures  Person educated: Patient Education method: Customer service manager Education comprehension: verbalized understanding and returned demonstration   HOME EXERCISE PROGRAM:  Access Code: [email or web address] URL: [email or web address] Date: 10/16/2021 Prepared by: [place]  Exercises - Half Tandem Stance Balance with Eyes Closed  - 1 x daily - 7 x weekly - 5 reps - 10 hold - Standing March with Counter Support  - 1 x  daily - 7 x weekly - 3 sets - 10 reps - Standing Hip Abduction with Counter Support  - 1 x daily - 7 x weekly - 3 sets - 10 reps - Mini Squat with Counter Support  - 1 x daily - 7 x weekly - 3 sets - 10 reps - Standing Hip Extension with Counter Support  - 1  x daily - 7 x weekly - 3 sets - 10 reps - Heel Raises with Counter Support  - 1 x daily - 7 x weekly - 3 sets - 10 reps - Heel Toe Raises with Counter Support  - 1 x daily - 7 x weekly - 3 sets - 10 reps   ASSESSMENT:  CLINICAL IMPRESSION:  Pt responded well to given exercises and was able to perform without any LOB.  Pt does tend to use heavy UE support throughout the session and pt advised to only use for minimal balance.  Pt agreeable and would attempt to perform at home as part of her updated HEP.  Will reassess progress with HEP and response to exercises at next visit.  Pt to continue to benefit from skilled therapy to address related balance issue and strength deficits likely contributing to balance complications.     OBJECTIVE IMPAIRMENTS Abnormal gait, decreased activity tolerance, decreased balance, decreased mobility, difficulty walking, decreased strength, dizziness, impaired sensation, and pain.   ACTIVITY LIMITATIONS carrying, lifting, bending, sitting, standing, squatting, sleeping, stairs, transfers, bed mobility, dressing, and hygiene/grooming  PARTICIPATION LIMITATIONS: meal prep, cleaning, laundry, driving, shopping, community activity, and yard work  PERSONAL FACTORS Age, Fitness, Past/current experiences, Time since onset of injury/illness/exacerbation, and 3+ comorbidities: Complex Regional Pain Syndrome, Depression, Seizure like Activity, COPD   are also affecting patient's functional outcome.   REHAB POTENTIAL: Fair Multiple bouts of PT in past and chronicity of ongoing neurological and MSK issues   CLINICAL DECISION MAKING: Evolving/moderate complexity  EVALUATION COMPLEXITY: Moderate   GOALS: Goals  reviewed with patient? No  SHORT TERM GOALS: Target date: 10/30/2021  Pt will be independent with HEP in order to improve strength and balance in order to decrease fall risk and improve function at home and work. Baseline: NT  Goal status: INITIAL  2.  Pt will decrease 5TSTS by at least 3 seconds in order to demonstrate clinically significant improvement in LE strength. Baseline: 23 sec   Goal status: INITIAL    LONG TERM GOALS: Target date: 12/25/2021   Patient will have improved function and activity level as evidenced by an increase in FOTO score by 10 points or more.  Baseline: 9/25 FOTO: 34/100  Goal status: INITIAL  2. Pt will improve BERG by at least 3 points in order to demonstrate clinically significant improvement in balance.  Baseline: 9/25: 44/56 Goal status: INITIAL  3.  Pt will decrease TUG to below 14 seconds in order to demonstrate decreased fall risk. Baseline: 9/25: 25.21 sec Goal status: INITIAL   4.  Patient will be able to perform tandem stance on RLE and LLE to show improved static balance and decreased risk of falling.  Baseline: Rom[place]  Goal status: INITIAL    PLAN: PT FREQUENCY: 1-2x/week  PT DURATION: 10 weeks  PLANNED INTERVENTIONS: Therapeutic exercises, Therapeutic activity, Neuromuscular re-education, Balance training, Gait training, Patient/Family education, Self Care, Joint mobilization, Stair training, Vestibular training, DME instructions, Aquatic Therapy, Dry Needling, Electrical stimulation, Spinal manipulation, Spinal mobilization, Cryotherapy, Moist heat, Manual therapy, and Re-evaluation  PLAN FOR NEXT SESSION: Take FOTO. Test strength of DF. Perform TUG and BERG    Gwenlyn Saran, PT, DPT 10/16/21, 7:36 PM

## 2021-10-18 ENCOUNTER — Ambulatory Visit: Payer: Medicare Other | Admitting: Physical Therapy

## 2021-10-23 ENCOUNTER — Ambulatory Visit: Payer: Medicare Other | Attending: Internal Medicine | Admitting: Physical Therapy

## 2021-10-23 ENCOUNTER — Encounter: Payer: Self-pay | Admitting: Physical Therapy

## 2021-10-23 DIAGNOSIS — M6281 Muscle weakness (generalized): Secondary | ICD-10-CM | POA: Diagnosis present

## 2021-10-23 DIAGNOSIS — R262 Difficulty in walking, not elsewhere classified: Secondary | ICD-10-CM | POA: Diagnosis present

## 2021-10-23 DIAGNOSIS — R296 Repeated falls: Secondary | ICD-10-CM | POA: Diagnosis present

## 2021-10-23 NOTE — Therapy (Signed)
OUTPATIENT PHYSICAL THERAPY TREATMENT NOTE   Patient Name: Katie Neal MRN: [id number] DOB:03-11-1970, 51 y.o., female Today's Date: 10/23/2021   PT End of Session - 10/23/21 1510     Visit Number 3    Number of Visits 20    Date for PT Re-Evaluation 12/12/21    Authorization Type Medicare 2023    Authorization - Visit Number 3    Authorization - Number of Visits 10    Progress Note Due on Visit 10    PT Start Time 1505    PT Stop Time 1545    PT Time Calculation (min) 40 min    Equipment Utilized During Treatment Gait belt    Activity Tolerance Patient limited by pain    Behavior During Therapy WFL for tasks assessed/performed             Past Medical History:  Diagnosis Date   Arthritis    Asthma    Cardiomegaly    Cerebral atrophy ([place])    Complex regional pain syndrome type 1    COPD (chronic obstructive pulmonary disease) (HCC)    CRPS (complex regional pain syndrome type I)    Depression    Diaphragmatic hernia    Dyspnea    Excessive falling    Headache    History of chronic sinusitis    History of kidney stones    Hyperlipidemia    Hypothyroidism    IBS (irritable bowel syndrome)    Insomnia    Memory difficulty    Osteoarthritis of knee    Pneumonia    Post concussion syndrome    PTSD (post-traumatic stress disorder)    Seizure-like activity ([place])    Sleep apnea    not on Cpap machine but is on oxygen    Urinary incontinence    Vasovagal syncope    Past Surgical History:  Procedure Laterality Date   ABDOMINAL HYSTERECTOMY     ACHILLES TENDON REPAIR  1982   APPENDECTOMY     CYSTOSCOPY WITH STENT PLACEMENT Right 06/27/2019   Procedure: CYSTOSCOPY WITH STENT PLACEMENT;  Surgeon: Lucas Mallow, MD;  Location: [facility];  Service: Urology;  Laterality: Right;   CYSTOSCOPY/RETROGRADE/URETEROSCOPY  07/14/2019   Procedure: CYSTOSCOPY/RETROGRADE/URETEROSCOPY;  Surgeon: Abbie Sons, MD;  Location: [facility];  Service: Urology;;    CYSTOSCOPY/URETEROSCOPY/HOLMIUM LASER/STENT PLACEMENT Right 07/14/2019   Procedure: CYSTOSCOPY/URETEROSCOPY/HOLMIUM LASER/STENT Exchange;  Surgeon: Abbie Sons, MD;  Location: [facility];  Service: Urology;  Laterality: Right;   ESOPHAGOGASTRODUODENOSCOPY N/A 10/21/2020   Procedure: ESOPHAGOGASTRODUODENOSCOPY (EGD);  Surgeon: Lesly Rubenstein, MD;  Location: [facility]COPY;  Service: Endoscopy;  Laterality: N/A;   FOOT SURGERY  2002   due to deformity   HIP SURGERY  2012   KNEE ARTHROSCOPY     LAPAROSCOPIC HYSTERECTOMY  2000   REPAIR TENDONS LEG     x2   Patient Active Problem List   Diagnosis Date Noted   Elevated troponin 09/27/2019   IBS (irritable bowel syndrome) 08/07/2019   Fever    Right lower quadrant abdominal pain    Right ureteral stone 06/27/2019   Sepsis ([place]) 06/27/2019   Acute pyelonephritis 06/27/2019   Weakness    Seizure ([place]) 12/30/2018   CRPS (complex regional pain syndrome type I)    HLD (hyperlipidemia)    Lower urinary tract infectious disease    Fall    Hypothyroidism    Headache disorder 05/06/2018   Dizziness 01/30/2018   Post concussion syndrome 01/30/2018   Vasovagal  syncope 11/27/2017   Seizure-like activity ([place]) 11/27/2017   Concussion with no loss of consciousness, initial encounter 10/23/2017   Excessive falling 10/23/2017   Urinary incontinence 10/23/2017   Chronic obstructive pulmonary disease ([place]) 09/09/2017   Obesity (BMI 35.0-39.9 without comorbidity) 06/07/2017   Transient neurological symptoms 05/24/2017   History of chronic sinusitis 12/21/2016   Abnormal PFTs 12/06/2015   Adjustment disorder with depressed mood 06/17/2015   Acute urinary retention 04/05/2015   Gross hematuria 04/05/2015   Acute non-recurrent maxillary sinusitis 10/20/2014   History of environmental allergies 07/08/2014   On home oxygen therapy 07/07/2014   Gastroparesis 06/28/2014   Pharyngoesophageal dysphagia 06/01/2014   ANA positive 05/11/2014    Anemia 05/11/2014   Shortness of breath 04/25/2014   Hypoxia 04/16/2014   Hiatal hernia 04/16/2014   Former tobacco use 04/16/2014   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   Hypoxemia requiring supplemental oxygen 03/22/2014   Globus sensation 03/20/2014   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   Midepigastric pain 03/20/2014   Pneumonia due to organism 03/19/2014   Tachycardia 03/02/2014   POTS (postural orthostatic tachycardia syndrome) 03/02/2014   Pain in joint, forearm 12/07/2013   Bilateral hand pain 10/28/2013   Insomnia 09/18/2013   High risk medication use 05/27/2013   Neurogenic pain of left foot 05/07/2013   Depression 04/08/2013   Major depressive disorder, recurrent episode, moderate ([place]) 04/08/2013   Closed fracture of metatarsal bone 02/16/2013   Low back pain 02/10/2013   OA (osteoarthritis) of knee 08/27/2012   Osteopenia 06/20/2012   H/O arthroplasty 09/20/2011   ACL graft tear ([place]) 08/02/2011   Sprain of medial collateral ligament of knee 08/02/2011    PCP: Dr. Alice Reichert   REFERRING PROVIDER: Dr. Harrel Lemon   REFERRING DIAG: Ataxia   THERAPY DIAG:  Difficulty in walking, not elsewhere classified  Muscle weakness (generalized)  Repeated falls  Rationale for Evaluation and Treatment Rehabilitation  ONSET DATE: 01/2013   SUBJECTIVE:   SUBJECTIVE STATEMENT:  Pt reports increased pain after last session and she had to stay in bed for two days afterward. She has not had a fall since last time she came to physical therapy.    PERTINENT HISTORY:  Patient has an extensive medical history which she believes is contributing to her current falls. The main reason she believes she is having fall is because of chronic regional pain syndrome, which she was orginally diagnosed with in 2015. She is in a constant state of pain especially in LEs . She has h/o of partial knee replacement on left knee as well as multiple ACL tears on left  knee. She also feels dizzy all the time which she attributes to her low blood oxygen levels and low blood pressure.   PAIN:  Are you having pain? 4/10 in bilateral LE, 6/10 for left wrist, TMJ pain   PRECAUTIONS: Fall  WEIGHT BEARING RESTRICTIONS No  FALLS:  Has patient fallen in last 6 months? Yes. Number of falls 8-9  LIVING ENVIRONMENT: Lives with: lives with their son Lives in: House/apartment Stairs: No Has following equipment at home:  Upright walker and SPC and [place] crutches and Manual WC    OCCUPATION: Disabled   PLOF: Needs assistance with ADLs  PATIENT GOALS To get a wheelchair evaluation for an electric WC. Increase ability to sit and stand longer and stop falling.    OBJECTIVE:   VITALS: BP 115/75 HR 96 SpO2 95   DIAGNOSTIC FINDINGS:   CLINICAL DATA:  Seizure.  Projectile vomiting.   EXAM: CT HEAD WITHOUT CONTRAST   TECHNIQUE: Contiguous axial images were obtained from the base of the skull through the vertex without intravenous contrast.   COMPARISON:  12/30/2018   FINDINGS: Brain: Age advanced cerebral atrophy again identified. No mass lesion, hemorrhage, hydrocephalus, acute infarct, intra-axial, or extra-axial fluid collection.   Vascular: No hyperdense vessel or unexpected calcification.   Skull: No significant soft tissue swelling.  No skull fracture.   Sinuses/Orbits: Normal imaged portions of the orbits and globes. Mucosal thickening of ethmoid air cells.   Other: None.   IMPRESSION: 1. No acute intracranial abnormality. 2. Age advanced cerebral atrophy. 3. Sinus disease.  PATIENT SURVEYS:  FOTO 34/100; Goal 40  COGNITION: Overall cognitive status: Within functional limits for tasks assessed     SENSATION: Light touch: Impaired Numbness and tingling around entire leg   EDEMA: Not evidenced    MUSCLE LENGTH: Not evidenced   POSTURE: rounded shoulders and forward head  PALPATION: Not performed   LOWER EXTREMITY  ROM:  WFL   LOWER EXTREMITY MMT:  MMT Right eval Left eval  Hip flexion 3+ 3+   Hip extension    Hip abduction 3 3+  Hip adduction 3 3  Hip internal rotation    Hip external rotation    Knee flexion 3+ 3+  Knee extension 3+ 3+  Ankle dorsiflexion    Ankle plantarflexion    Ankle inversion    Ankle eversion     (Blank rows = not tested)   FUNCTIONAL TESTS:  5 times sit to stand: 26 sec  30 seconds chair stand test: 6 reps  Berg Balance Scale: NT   STEADI 4-Stage Balance Test: (inability to maintain tandem stance for 10 sec = increased risk for falls) - feet together, eyes open, noncompliant surface: 10 sec - semi-tandem stance, eyes open, noncompliant surface: 3/3 sec - tandem stances, eyes open, noncompliant surface: NT  - single leg stance:         - R LE: NT          - L LE: NT   GAIT: Distance walked: 23 ft  Assistive device utilized: None Level of assistance: SBA Comments: Wide base of support and shuffling gait    TODAY'S TREATMENT:  10/23/21:  MMT DF R/L 4/4*  *Painful in left foot due to prior breaks   Supine Bridges 3 x 5  Lower Trunk Rotation 2 x 10  SLR 3 x 5  SLR 1 x 5 with wedge to prevent supine position because of GERD  Seated Hip Abduction with Red band 3 x 5  -min VC to come back slow   10/16/21  TherAct:  Assessment of tests and measures for goal assessment.  FOTO: 34/100 BERG: 44/56 TUG: 25.21 sec   TherEx:  Standing mini squats with B UE support, x10 Standing marches with B UE support, x10 Standing hip abduction with B UE support, x10 each LE Standing hip extension with B UE support, x10 each LE Standing calf raises with B UE support, x10    PATIENT EDUCATION:  Education details: form and technique for appropriate exercise and explanation about tests and measures  Person educated: Patient Education method: Customer service manager Education comprehension: verbalized understanding and returned  demonstration   HOME EXERCISE PROGRAM:  Access Code: [email or web address] URL: [email or web address] Date: 10/23/2021 Prepared by: Bradly Chris  Exercises - Supine Lower Trunk Rotation  - 1 x daily - 3 x weekly - 3 sets - 10 reps -  Half Tandem Stance Balance with Eyes Closed  - 1 x daily - 7 x weekly - 5 reps - 10 hold - Supine Bridge  - 1 x daily - 3 x weekly - 3 sets - 5 reps - Seated Hip Abduction with Resistance  - 1 x daily - 3 x weekly - 3 sets - 5 reps - Active Straight Leg Raise with Quad Set  - 1 x daily - 3 x weekly - 3 sets - 5 reps  ASSESSMENT:  CLINICAL IMPRESSION:  Pt able to perform all exercises without an increase in her baseline pain and she exhibits weakness in hip abductors and adductors. HEP modified significantly to include exercises in non-weight bearing positions and with reduced reps to avoid exacerbation of pain. She will continue to benefit from skilled therapy to address related balance issue and strength deficits likely contributing to balance complications.   OBJECTIVE IMPAIRMENTS Abnormal gait, decreased activity tolerance, decreased balance, decreased mobility, difficulty walking, decreased strength, dizziness, impaired sensation, and pain.   ACTIVITY LIMITATIONS carrying, lifting, bending, sitting, standing, squatting, sleeping, stairs, transfers, bed mobility, dressing, and hygiene/grooming  PARTICIPATION LIMITATIONS: meal prep, cleaning, laundry, driving, shopping, community activity, and yard work  PERSONAL FACTORS Age, Fitness, Past/current experiences, Time since onset of injury/illness/exacerbation, and 3+ comorbidities: Complex Regional Pain Syndrome, Depression, Seizure like Activity, COPD   are also affecting patient's functional outcome.   REHAB POTENTIAL: Fair Multiple bouts of PT in past and chronicity of ongoing neurological and MSK issues   CLINICAL DECISION MAKING: Evolving/moderate complexity  EVALUATION COMPLEXITY:  Moderate   GOALS: Goals reviewed with patient? No  SHORT TERM GOALS: Target date: 11/06/2021  Pt will be independent with HEP in order to improve strength and balance in order to decrease fall risk and improve function at home and work. Baseline: NT  Goal status: INITIAL  2.  Pt will decrease 5TSTS by at least 3 seconds in order to demonstrate clinically significant improvement in LE strength. Baseline: 23 sec   Goal status: INITIAL    LONG TERM GOALS: Target date: 01/01/2022   Patient will have improved function and activity level as evidenced by an increase in FOTO score by 10 points or more.  Baseline: 9/25 FOTO: 34/100  Goal status: INITIAL  2. Pt will improve BERG by at least 3 points in order to demonstrate clinically significant improvement in balance.  Baseline: 9/25: 44/56 Goal status: INITIAL  3.  Pt will decrease TUG to below 14 seconds in order to demonstrate decreased fall risk. Baseline: 9/25: 25.21 sec Goal status: INITIAL   4.  Patient will be able to perform tandem stance on RLE and LLE to show improved static balance and decreased risk of falling.  Baseline: [place]  Goal status: INITIAL    PLAN: PT FREQUENCY: 1-2x/week  PT DURATION: 10 weeks  PLANNED INTERVENTIONS: Therapeutic exercises, Therapeutic activity, Neuromuscular re-education, Balance training, Gait training, Patient/Family education, Self Care, Joint mobilization, Stair training, Vestibular training, DME instructions, Aquatic Therapy, Dry Needling, Electrical stimulation, Spinal manipulation, Spinal mobilization, Cryotherapy, Moist heat, Manual therapy, and Re-evaluation  PLAN FOR NEXT SESSION: Continue to progress hip strengthening exercises in non-pain provoking positions    Bradly Chris PT, DPT  10/23/21, 3:11 PM

## 2021-10-25 ENCOUNTER — Encounter: Payer: Medicare Other | Admitting: Physical Therapy

## 2021-11-01 ENCOUNTER — Ambulatory Visit: Payer: Medicare Other | Admitting: Physical Therapy

## 2021-11-01 ENCOUNTER — Encounter: Payer: Medicare Other | Admitting: Physical Therapy

## 2021-11-08 ENCOUNTER — Encounter: Payer: Medicare Other | Admitting: Physical Therapy

## 2021-11-08 ENCOUNTER — Ambulatory Visit: Payer: Medicare Other | Admitting: Physical Therapy

## 2021-11-08 ENCOUNTER — Encounter: Payer: Self-pay | Admitting: Physical Therapy

## 2021-11-08 DIAGNOSIS — R262 Difficulty in walking, not elsewhere classified: Secondary | ICD-10-CM

## 2021-11-08 DIAGNOSIS — M6281 Muscle weakness (generalized): Secondary | ICD-10-CM

## 2021-11-08 DIAGNOSIS — R296 Repeated falls: Secondary | ICD-10-CM

## 2021-11-08 NOTE — Therapy (Signed)
OUTPATIENT PHYSICAL THERAPY TREATMENT NOTE   Patient Name: Katie Neal MRN: [id number] DOB:[birth year], 51 y.o., female Today's Date: 11/08/2021   PT End of Session - 11/08/21 1512     Visit Number 4    Number of Visits 20    Date for PT Re-Evaluation 12/12/21    Authorization Type Medicare 2023    Authorization - Visit Number 4    Authorization - Number of Visits 10    Progress Note Due on Visit 10    PT Start Time 1505    PT Stop Time 1545    PT Time Calculation (min) 40 min    Equipment Utilized During Treatment Gait belt    Activity Tolerance Patient limited by pain    Behavior During Therapy WFL for tasks assessed/performed             Past Medical History:  Diagnosis Date   Arthritis    Asthma    Cardiomegaly    Cerebral atrophy ([place])    Complex regional pain syndrome type 1    COPD (chronic obstructive pulmonary disease) (HCC)    CRPS (complex regional pain syndrome type I)    Depression    Diaphragmatic hernia    Dyspnea    Excessive falling    Headache    History of chronic sinusitis    History of kidney stones    Hyperlipidemia    Hypothyroidism    IBS (irritable bowel syndrome)    Insomnia    Memory difficulty    Osteoarthritis of knee    Pneumonia    Post concussion syndrome    PTSD (post-traumatic stress disorder)    Seizure-like activity (HCC)    Sleep apnea    not on Cpap machine but is on oxygen    Urinary incontinence    Vasovagal syncope    Past Surgical History:  Procedure Laterality Date   ABDOMINAL HYSTERECTOMY     ACHILLES TENDON REPAIR  1982   APPENDECTOMY     CYSTOSCOPY WITH STENT PLACEMENT Right 06/27/2019   Procedure: CYSTOSCOPY WITH STENT PLACEMENT;  Surgeon: Lucas Mallow, MD;  Location: [facility];  Service: Urology;  Laterality: Right;   CYSTOSCOPY/RETROGRADE/URETEROSCOPY  07/14/2019   Procedure: CYSTOSCOPY/RETROGRADE/URETEROSCOPY;  Surgeon: Abbie Sons, MD;  Location: [facility];  Service: Urology;;    CYSTOSCOPY/URETEROSCOPY/HOLMIUM LASER/STENT PLACEMENT Right 07/14/2019   Procedure: CYSTOSCOPY/URETEROSCOPY/HOLMIUM LASER/STENT Exchange;  Surgeon: Abbie Sons, MD;  Location: [facility];  Service: Urology;  Laterality: Right;   ESOPHAGOGASTRODUODENOSCOPY N/A 10/21/2020   Procedure: ESOPHAGOGASTRODUODENOSCOPY (EGD);  Surgeon: Lesly Rubenstein, MD;  Location: [facility]COPY;  Service: Endoscopy;  Laterality: N/A;   FOOT SURGERY  2002   due to deformity   HIP SURGERY  2012   KNEE ARTHROSCOPY     LAPAROSCOPIC HYSTERECTOMY  2000   REPAIR TENDONS LEG     x2   Patient Active Problem List   Diagnosis Date Noted   Elevated troponin 09/27/2019   IBS (irritable bowel syndrome) 08/07/2019   Fever    Right lower quadrant abdominal pain    Right ureteral stone 06/27/2019   Sepsis ([place]) 06/27/2019   Acute pyelonephritis 06/27/2019   Weakness    Seizure ([place]) 12/30/2018   CRPS (complex regional pain syndrome type I)    HLD (hyperlipidemia)    Lower urinary tract infectious disease    Fall    Hypothyroidism    Headache disorder 05/06/2018   Dizziness 01/30/2018   Post concussion syndrome 01/30/2018   Vasovagal  syncope 11/27/2017   Seizure-like activity ([place]) 11/27/2017   Concussion with no loss of consciousness, initial encounter 10/23/2017   Excessive falling 10/23/2017   Urinary incontinence 10/23/2017   Chronic obstructive pulmonary disease ([place]) 09/09/2017   Obesity (BMI 35.0-39.9 without comorbidity) 06/07/2017   Transient neurological symptoms 05/24/2017   History of chronic sinusitis 12/21/2016   Abnormal PFTs 12/06/2015   Adjustment disorder with depressed mood 06/17/2015   Acute urinary retention 04/05/2015   Gross hematuria 04/05/2015   Acute non-recurrent maxillary sinusitis 10/20/2014   History of environmental allergies 07/08/2014   On home oxygen therapy 07/07/2014   Gastroparesis 06/28/2014   Pharyngoesophageal dysphagia 06/01/2014   ANA positive 05/11/2014    Anemia 05/11/2014   Shortness of breath 04/25/2014   Hypoxia 04/16/2014   Hiatal hernia 04/16/2014   Former tobacco use 04/16/2014   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   Hypoxemia requiring supplemental oxygen 03/22/2014   Globus sensation 03/20/2014   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   Midepigastric pain 03/20/2014   Pneumonia due to organism 03/19/2014   Tachycardia 03/02/2014   POTS (postural orthostatic tachycardia syndrome) 03/02/2014   Pain in joint, forearm 12/07/2013   Bilateral hand pain 10/28/2013   Insomnia 09/18/2013   High risk medication use 05/27/2013   Neurogenic pain of left foot 05/07/2013   Depression 04/08/2013   Major depressive disorder, recurrent episode, moderate ([place]) 04/08/2013   Closed fracture of metatarsal bone 02/16/2013   Low back pain 02/10/2013   OA (osteoarthritis) of knee 08/27/2012   Osteopenia 06/20/2012   H/O arthroplasty 09/20/2011   ACL graft tear ([place]) 08/02/2011   Sprain of medial collateral ligament of knee 08/02/2011    PCP: Dr. Alice Reichert   REFERRING PROVIDER: Dr. Harrel Lemon   REFERRING DIAG: Ataxia   THERAPY DIAG:  Difficulty in walking, not elsewhere classified  Muscle weakness (generalized)  Repeated falls  Rationale for Evaluation and Treatment Rehabilitation  ONSET DATE: 01/2013   SUBJECTIVE:   SUBJECTIVE STATEMENT: Pt states that she is feeling better since contracting COVID and being sick the past last week.   PERTINENT HISTORY:  Patient has an extensive medical history which she believes is contributing to her current falls. The main reason she believes she is having fall is because of chronic regional pain syndrome, which she was orginally diagnosed with in 2015. She is in a constant state of pain especially in LEs . She has h/o of partial knee replacement on left knee as well as multiple ACL tears on left knee. She also feels dizzy all the time which she attributes to her low  blood oxygen levels and low blood pressure.   PAIN:  Are you having pain? 6/10 in bilateral LE, 6/10 for left wrist, TMJ pain   PRECAUTIONS: Fall  WEIGHT BEARING RESTRICTIONS No  FALLS:  Has patient fallen in last 6 months? Yes. Number of falls 8-9  LIVING ENVIRONMENT: Lives with: lives with their son Lives in: House/apartment Stairs: No Has following equipment at home:  Upright walker and SPC and [place] crutches and Manual WC    OCCUPATION: Disabled   PLOF: Needs assistance with ADLs  PATIENT GOALS To get a wheelchair evaluation for an electric WC. Increase ability to sit and stand longer and stop falling.    OBJECTIVE:   VITALS: BP 115/75 HR 96 SpO2 95   DIAGNOSTIC FINDINGS:   CLINICAL DATA:  Seizure.  Projectile vomiting.   EXAM: CT HEAD WITHOUT CONTRAST   TECHNIQUE: Contiguous axial images were  obtained from the base of the skull through the vertex without intravenous contrast.   COMPARISON:  12/30/2018   FINDINGS: Brain: Age advanced cerebral atrophy again identified. No mass lesion, hemorrhage, hydrocephalus, acute infarct, intra-axial, or extra-axial fluid collection.   Vascular: No hyperdense vessel or unexpected calcification.   Skull: No significant soft tissue swelling.  No skull fracture.   Sinuses/Orbits: Normal imaged portions of the orbits and globes. Mucosal thickening of ethmoid air cells.   Other: None.   IMPRESSION: 1. No acute intracranial abnormality. 2. Age advanced cerebral atrophy. 3. Sinus disease.  PATIENT SURVEYS:  FOTO 34/100; Goal 40  COGNITION: Overall cognitive status: Within functional limits for tasks assessed     SENSATION: Light touch: Impaired Numbness and tingling around entire leg   EDEMA: Not evidenced    MUSCLE LENGTH: Not evidenced   POSTURE: rounded shoulders and forward head  PALPATION: Not performed   LOWER EXTREMITY ROM:  WFL   LOWER EXTREMITY MMT:  MMT Right eval Left eval  Hip  flexion 3+ 3+   Hip extension    Hip abduction 3 3+  Hip adduction 3 3  Hip internal rotation    Hip external rotation    Knee flexion 3+ 3+  Knee extension 3+ 3+  Ankle dorsiflexion    Ankle plantarflexion    Ankle inversion    Ankle eversion     (Blank rows = not tested)   FUNCTIONAL TESTS:  5 times sit to stand: 26 sec  30 seconds chair stand test: 6 reps  Berg Balance Scale: NT   STEADI 4-Stage Balance Test: (inability to maintain tandem stance for 10 sec = increased risk for falls) - feet together, eyes open, noncompliant surface: 10 sec - semi-tandem stance, eyes open, noncompliant surface: 3/3 sec - tandem stances, eyes open, noncompliant surface: NT  - single leg stance:         - R LE: NT          - L LE: NT   GAIT: Distance walked: 23 ft  Assistive device utilized: None Level of assistance: SBA Comments: Wide base of support and shuffling gait    TODAY'S TREATMENT:  11/08/21: Nu-Step Level 1 and Seat and Arms 6 FOR 5 min  Supine Bridges 3 x 5  SLR 3 x 5  Seated Hip Abduction with red band 3 x 5   10/23/21:  MMT DF R/L 4/4*  *Painful in left foot due to prior breaks   Supine Bridges 3 x 5  Lower Trunk Rotation 2 x 10  SLR 3 x 5  SLR 1 x 5 with wedge to prevent supine position because of GERD  Seated Hip Abduction with Red band 3 x 5  -min VC to come back slow   10/16/21  TherAct:  Assessment of tests and measures for goal assessment.  FOTO: 34/100 BERG: 44/56 TUG: 25.21 sec   TherEx:  Standing mini squats with B UE support, x10 Standing marches with B UE support, x10 Standing hip abduction with B UE support, x10 each LE Standing hip extension with B UE support, x10 each LE Standing calf raises with B UE support, x10    PATIENT EDUCATION:  Education details: form and technique for appropriate exercise and explanation about tests and measures  Person educated: Patient Education method: Customer service manager Education  comprehension: verbalized understanding and returned demonstration   HOME EXERCISE PROGRAM:  Access Code: [email or web address] URL: [email or web address] Date: 10/23/2021 Prepared by: Bradly Chris  Exercises -  Supine Lower Trunk Rotation  - 1 x daily - 3 x weekly - 3 sets - 10 reps - Half Tandem Stance Balance with Eyes Closed  - 1 x daily - 7 x weekly - 5 reps - 10 hold - Supine Bridge  - 1 x daily - 3 x weekly - 3 sets - 5 reps - Seated Hip Abduction with Resistance  - 1 x daily - 3 x weekly - 3 sets - 5 reps - Active Straight Leg Raise with Quad Set  - 1 x daily - 3 x weekly - 3 sets - 5 reps  ASSESSMENT:  CLINICAL IMPRESSION:  Session basically a recap of last session to reinitiate patient to exercise after she had to stop exercise due to COVID. She was able to perform all exercises with a slight increase in pain. Reps kept to five to avoid over exertion. She will continue to benefit from skilled therapy to address related balance issue and strength deficits likely contributing to balance complications.   OBJECTIVE IMPAIRMENTS Abnormal gait, decreased activity tolerance, decreased balance, decreased mobility, difficulty walking, decreased strength, dizziness, impaired sensation, and pain.   ACTIVITY LIMITATIONS carrying, lifting, bending, sitting, standing, squatting, sleeping, stairs, transfers, bed mobility, dressing, and hygiene/grooming  PARTICIPATION LIMITATIONS: meal prep, cleaning, laundry, driving, shopping, community activity, and yard work  PERSONAL FACTORS Age, Fitness, Past/current experiences, Time since onset of injury/illness/exacerbation, and 3+ comorbidities: Complex Regional Pain Syndrome, Depression, Seizure like Activity, COPD   are also affecting patient's functional outcome.   REHAB POTENTIAL: Fair Multiple bouts of PT in past and chronicity of ongoing neurological and MSK issues   CLINICAL DECISION MAKING: Evolving/moderate complexity  EVALUATION  COMPLEXITY: Moderate   GOALS: Goals reviewed with patient? No  SHORT TERM GOALS: Target date: 11/22/2021  Pt will be independent with HEP in order to improve strength and balance in order to decrease fall risk and improve function at home and work. Baseline: NT  Goal status: IN PROGRESS  2.  Pt will decrease 5TSTS by at least 3 seconds in order to demonstrate clinically significant improvement in LE strength. Baseline: 23 sec   Goal status: IN PROGRESS    LONG TERM GOALS: Target date: 01/17/2022   Patient will have improved function and activity level as evidenced by an increase in FOTO score by 10 points or more.  Baseline: 9/25 FOTO: 34/100  Goal status: IN PROGRESS  2. Pt will improve BERG by at least 3 points in order to demonstrate clinically significant improvement in balance.  Baseline: 9/25: 44/56 Goal status: IN PROGRESS   3.  Pt will decrease TUG to below 14 seconds in order to demonstrate decreased fall risk. Baseline: 9/25: 25.21 sec Goal status: INITIAL   4.  Patient will be able to perform tandem stance on RLE and LLE to show improved static balance and decreased risk of falling.  Baseline: [place]  Goal status: INITIAL    PLAN: PT FREQUENCY: 1-2x/week  PT DURATION: 10 weeks  PLANNED INTERVENTIONS: Therapeutic exercises, Therapeutic activity, Neuromuscular re-education, Balance training, Gait training, Patient/Family education, Self Care, Joint mobilization, Stair training, Vestibular training, DME instructions, Aquatic Therapy, Dry Needling, Electrical stimulation, Spinal manipulation, Spinal mobilization, Cryotherapy, Moist heat, Manual therapy, and Re-evaluation  PLAN FOR NEXT SESSION: Continue to progress hip strengthening exercises in non-pain provoking positions    Bradly Chris PT, DPT  11/08/21, 3:13 PM

## 2021-11-16 ENCOUNTER — Ambulatory Visit: Payer: Medicare Other | Admitting: Physical Therapy

## 2021-11-16 DIAGNOSIS — R296 Repeated falls: Secondary | ICD-10-CM

## 2021-11-16 DIAGNOSIS — M6281 Muscle weakness (generalized): Secondary | ICD-10-CM

## 2021-11-16 DIAGNOSIS — R262 Difficulty in walking, not elsewhere classified: Secondary | ICD-10-CM | POA: Diagnosis not present

## 2021-11-16 NOTE — Therapy (Addendum)
OUTPATIENT PHYSICAL THERAPY TREATMENT NOTE/ Re-certification    Dates of reporting : 10/02/21-12/02/21   Patient Name: Katie Neal MRN: [id number] DOB:1970/08/30, 51 y.o., female Today's Date: 11/16/2021   PT End of Session - 11/16/21 1503     Visit Number 5    Number of Visits 20    Date for PT Re-Evaluation 12/12/21    Authorization Type Medicare 2023    Authorization - Visit Number 5    Authorization - Number of Visits 10    Progress Note Due on Visit 10    PT Start Time 4818    PT Stop Time 1500    PT Time Calculation (min) 40 min    Equipment Utilized During Treatment Gait belt    Activity Tolerance Patient limited by pain    Behavior During Therapy WFL for tasks assessed/performed              Past Medical History:  Diagnosis Date   Arthritis    Asthma    Cardiomegaly    Cerebral atrophy ([place])    Complex regional pain syndrome type 1    COPD (chronic obstructive pulmonary disease) (HCC)    CRPS (complex regional pain syndrome type I)    Depression    Diaphragmatic hernia    Dyspnea    Excessive falling    Headache    History of chronic sinusitis    History of kidney stones    Hyperlipidemia    Hypothyroidism    IBS (irritable bowel syndrome)    Insomnia    Memory difficulty    Osteoarthritis of knee    Pneumonia    Post concussion syndrome    PTSD (post-traumatic stress disorder)    Seizure-like activity ([place])    Sleep apnea    not on Cpap machine but is on oxygen    Urinary incontinence    Vasovagal syncope    Past Surgical History:  Procedure Laterality Date   ABDOMINAL HYSTERECTOMY     ACHILLES TENDON REPAIR  1982   APPENDECTOMY     CYSTOSCOPY WITH STENT PLACEMENT Right 06/27/2019   Procedure: CYSTOSCOPY WITH STENT PLACEMENT;  Surgeon: Lucas Mallow, MD;  Location: [facility];  Service: Urology;  Laterality: Right;   CYSTOSCOPY/RETROGRADE/URETEROSCOPY  07/14/2019   Procedure: CYSTOSCOPY/RETROGRADE/URETEROSCOPY;  Surgeon: Abbie Sons, MD;  Location: [facility];  Service: Urology;;   CYSTOSCOPY/URETEROSCOPY/HOLMIUM LASER/STENT PLACEMENT Right 07/14/2019   Procedure: CYSTOSCOPY/URETEROSCOPY/HOLMIUM LASER/STENT Exchange;  Surgeon: Abbie Sons, MD;  Location: [facility];  Service: Urology;  Laterality: Right;   ESOPHAGOGASTRODUODENOSCOPY N/A 10/21/2020   Procedure: ESOPHAGOGASTRODUODENOSCOPY (EGD);  Surgeon: Lesly Rubenstein, MD;  Location[facility];  Service: Endoscopy;  Laterality: N/A;   FOOT SURGERY  2002   due to deformity   HIP SURGERY  2012   KNEE ARTHROSCOPY     LAPAROSCOPIC HYSTERECTOMY  2000   REPAIR TENDONS LEG     x2   Patient Active Problem List   Diagnosis Date Noted   Elevated troponin 09/27/2019   IBS (irritable bowel syndrome) 08/07/2019   Fever    Right lower quadrant abdominal pain    Right ureteral stone 06/27/2019   Sepsis ([place]) 06/27/2019   Acute pyelonephritis 06/27/2019   Weakness    Seizure ([place]) 12/30/2018   CRPS (complex regional pain syndrome type I)    HLD (hyperlipidemia)    Lower urinary tract infectious disease    Fall    Hypothyroidism    Headache disorder 05/06/2018   Dizziness  01/30/2018   Post concussion syndrome 01/30/2018   Vasovagal syncope 11/27/2017   Seizure-like activity ([place]) 11/27/2017   Concussion with no loss of consciousness, initial encounter 10/23/2017   Excessive falling 10/23/2017   Urinary incontinence 10/23/2017   Chronic obstructive pulmonary disease ([place]) 09/09/2017   Obesity (BMI 35.0-39.9 without comorbidity) 06/07/2017   Transient neurological symptoms 05/24/2017   History of chronic sinusitis 12/21/2016   Abnormal PFTs 12/06/2015   Adjustment disorder with depressed mood 06/17/2015   Acute urinary retention 04/05/2015   Gross hematuria 04/05/2015   Acute non-recurrent maxillary sinusitis 10/20/2014   History of environmental allergies 07/08/2014   On home oxygen therapy 07/07/2014   Gastroparesis 06/28/2014    Pharyngoesophageal dysphagia 06/01/2014   ANA positive 05/11/2014   Anemia 05/11/2014   Shortness of breath 04/25/2014   Hypoxia 04/16/2014   Hiatal hernia 04/16/2014   Former tobacco use 04/16/2014   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   Hypoxemia requiring supplemental oxygen 03/22/2014   Globus sensation 03/20/2014   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   Midepigastric pain 03/20/2014   Pneumonia due to organism 03/19/2014   Tachycardia 03/02/2014   POTS (postural orthostatic tachycardia syndrome) 03/02/2014   Pain in joint, forearm 12/07/2013   Bilateral hand pain 10/28/2013   Insomnia 09/18/2013   High risk medication use 05/27/2013   Neurogenic pain of left foot 05/07/2013   Depression 04/08/2013   Major depressive disorder, recurrent episode, moderate ([place]) 04/08/2013   Closed fracture of metatarsal bone 02/16/2013   Low back pain 02/10/2013   OA (osteoarthritis) of knee 08/27/2012   Osteopenia 06/20/2012   H/O arthroplasty 09/20/2011   ACL graft tear ([place]) 08/02/2011   Sprain of medial collateral ligament of knee 08/02/2011    PCP: Dr. Alice Reichert   REFERRING PROVIDER: Dr. Harrel Lemon   REFERRING DIAG: Ataxia   THERAPY DIAG:  Difficulty in walking, not elsewhere classified  Muscle weakness (generalized)  Repeated falls  Rationale for Evaluation and Treatment Rehabilitation  ONSET DATE: 01/2013   SUBJECTIVE:   SUBJECTIVE STATEMENT: Pt reports having increased pain this session because of increased activity from having family visit. She had to go up and downstairs and care for her grandchild which was physically exhausting.  She has continued to have issues with her RLE with multiple ligamentous injuries along with an IT band injury from a prior surgery.  PERTINENT HISTORY:  Patient has an extensive medical history which she believes is contributing to her current falls. The main reason she believes she is having fall is because of  chronic regional pain syndrome, which she was orginally diagnosed with in 2015. She is in a constant state of pain especially in LEs . She has h/o of partial knee replacement on left knee as well as multiple ACL tears on left knee. She also feels dizzy all the time which she attributes to her low blood oxygen levels and low blood pressure.   PAIN:  Are you having pain? 4-5/10 in bilateral LE ankles and feet    PRECAUTIONS: Fall  WEIGHT BEARING RESTRICTIONS No  FALLS:  Has patient fallen in last 6 months? Yes. Number of falls 8-9  LIVING ENVIRONMENT: Lives with: lives with their son Lives in: House/apartment Stairs: No Has following equipment at home:  Upright walker and SPC and [place] crutches and Manual WC    OCCUPATION: Disabled   PLOF: Needs assistance with ADLs  PATIENT GOALS To get a wheelchair evaluation for an electric WC. Increase ability to sit and  stand longer and stop falling.    OBJECTIVE:   VITALS: BP 115/75 HR 96 SpO2 95   DIAGNOSTIC FINDINGS:   CLINICAL DATA:  Seizure.  Projectile vomiting.   EXAM: CT HEAD WITHOUT CONTRAST   TECHNIQUE: Contiguous axial images were obtained from the base of the skull through the vertex without intravenous contrast.   COMPARISON:  12/30/2018   FINDINGS: Brain: Age advanced cerebral atrophy again identified. No mass lesion, hemorrhage, hydrocephalus, acute infarct, intra-axial, or extra-axial fluid collection.   Vascular: No hyperdense vessel or unexpected calcification.   Skull: No significant soft tissue swelling.  No skull fracture.   Sinuses/Orbits: Normal imaged portions of the orbits and globes. Mucosal thickening of ethmoid air cells.   Other: None.   IMPRESSION: 1. No acute intracranial abnormality. 2. Age advanced cerebral atrophy. 3. Sinus disease.  PATIENT SURVEYS:  FOTO 34/100; Goal 40  COGNITION: Overall cognitive status: Within functional limits for tasks assessed     SENSATION: Light  touch: Impaired Numbness and tingling around entire leg   EDEMA: Not evidenced    MUSCLE LENGTH: Not evidenced   POSTURE: rounded shoulders and forward head  PALPATION: Not performed   LOWER EXTREMITY ROM:  WFL   LOWER EXTREMITY MMT:  MMT Right eval Left eval  Hip flexion 3+ 3+   Hip extension    Hip abduction 3 3+  Hip adduction 3 3  Hip internal rotation    Hip external rotation    Knee flexion 3+ 3+  Knee extension 3+ 3+  Ankle dorsiflexion    Ankle plantarflexion    Ankle inversion    Ankle eversion     (Blank rows = not tested)   FUNCTIONAL TESTS:  5 times sit to stand: 26 sec  30 seconds chair stand test: 6 reps  Berg Balance Scale: NT   STEADI 4-Stage Balance Test: (inability to maintain tandem stance for 10 sec = increased risk for falls) - feet together, eyes open, noncompliant surface: 10 sec - semi-tandem stance, eyes open, noncompliant surface: 3/3 sec - tandem stances, eyes open, noncompliant surface: NT  - single leg stance:         - R LE: NT          - L LE: NT   GAIT: Distance walked: 23 ft  Assistive device utilized: None Level of assistance: SBA Comments: Wide base of support and shuffling gait    TODAY'S TREATMENT:  11/16/21: Seated Marches 3 x 10  Ankle Pumps 3 x 10  Supine Bridges 3 x 10  Lower Trunk Rotation 3 x 10  Long Sitting HS Stretch 3 x 30 sec  Side Lying Calm Shell 4 x 5    11/08/21: Nu-Step Level 1 and Seat and Arms 6 FOR 5 min  Supine Bridges 3 x 5  SLR 3 x 5  Seated Hip Abduction with red band 3 x 5   10/23/21:  MMT DF R/L 4/4*  *Painful in left foot due to prior breaks   Supine Bridges 3 x 5  Lower Trunk Rotation 2 x 10  SLR 3 x 5  SLR 1 x 5 with wedge to prevent supine position because of GERD  Seated Hip Abduction with Red band 3 x 5  -min VC to come back slow   10/16/21  TherAct:  Assessment of tests and measures for goal assessment.  FOTO: 34/100 BERG: 44/56 TUG: 25.21  sec   TherEx:  Standing mini squats with B UE support, x10 Standing  marches with B UE support, x10 Standing hip abduction with B UE support, x10 each LE Standing hip extension with B UE support, x10 each LE Standing calf raises with B UE support, x10    PATIENT EDUCATION:  Education details: form and technique for appropriate exercise and explanation about tests and measures  Person educated: Patient Education method: Customer service manager Education comprehension: verbalized understanding and returned demonstration   HOME EXERCISE PROGRAM:  Access Code: [email or web address] URL: [email or web address] Date: 10/23/2021 Prepared by: Bradly Chris  Exercises - Supine Lower Trunk Rotation  - 1 x daily - 3 x weekly - 3 sets - 10 reps - Half Tandem Stance Balance with Eyes Closed  - 1 x daily - 7 x weekly - 5 reps - 10 hold - Supine Bridge  - 1 x daily - 3 x weekly - 3 sets - 5 reps - Seated Hip Abduction with Resistance  - 1 x daily - 3 x weekly - 3 sets - 5 reps - Active Straight Leg Raise with Quad Set  - 1 x daily - 3 x weekly - 3 sets - 5 reps  ASSESSMENT:  CLINICAL IMPRESSION:  Pt arrives with increased pain due to overactivity. Session limited to ROM exercises and no strengthening exercises with resistance. Pt is slowly progressing towards goals given several disruptions to freuqency due to other health issues. She will continue to benefit from skilled therapy to address related balance issue and strength deficits likely contributing to balance complications.  OBJECTIVE IMPAIRMENTS Abnormal gait, decreased activity tolerance, decreased balance, decreased mobility, difficulty walking, decreased strength, dizziness, impaired sensation, and pain.   ACTIVITY LIMITATIONS carrying, lifting, bending, sitting, standing, squatting, sleeping, stairs, transfers, bed mobility, dressing, and hygiene/grooming  PARTICIPATION LIMITATIONS: meal prep, cleaning, laundry, driving,  shopping, community activity, and yard work  PERSONAL FACTORS Age, Fitness, Past/current experiences, Time since onset of injury/illness/exacerbation, and 3+ comorbidities: Complex Regional Pain Syndrome, Depression, Seizure like Activity, COPD   are also affecting patient's functional outcome.   REHAB POTENTIAL: Fair Multiple bouts of PT in past and chronicity of ongoing neurological and MSK issues   CLINICAL DECISION MAKING: Evolving/moderate complexity  EVALUATION COMPLEXITY: Moderate   GOALS: Goals reviewed with patient? No  SHORT TERM GOALS: Target date: 11/30/2021  Pt will be independent with HEP in order to improve strength and balance in order to decrease fall risk and improve function at home and work. Baseline: NT  Goal status: IN PROGRESS  2.  Pt will decrease 5TSTS by at least 3 seconds in order to demonstrate clinically significant improvement in LE strength. Baseline: 23 sec   Goal status: IN PROGRESS    LONG TERM GOALS: Target date: 01/25/2022   Patient will have improved function and activity level as evidenced by an increase in FOTO score by 10 points or more.  Baseline: 9/25 FOTO: 34/100  Goal status: IN PROGRESS  2. Pt will improve BERG by at least 3 points in order to demonstrate clinically significant improvement in balance.  Baseline: 9/25: 44/56 Goal status: IN PROGRESS   3.  Pt will decrease TUG to below 14 seconds in order to demonstrate decreased fall risk. Baseline: 9/25: 25.21 sec Goal status: IN PROGRESS   4.  Patient will be able to perform tandem stance on RLE and LLE to show improved static balance and decreased risk of falling.  Baseline: [place]  Goal status: INITIAL    PLAN: PT FREQUENCY: 1-2x/week  PT DURATION: 10 weeks  PLANNED INTERVENTIONS: Therapeutic exercises, Therapeutic  activity, Neuromuscular re-education, Balance training, Gait training, Patient/Family education, Self Care, Joint mobilization, Stair training, Vestibular  training, DME instructions, Aquatic Therapy, Dry Needling, Electrical stimulation, Spinal manipulation, Spinal mobilization, Cryotherapy, Moist heat, Manual therapy, and Re-evaluation  PLAN FOR NEXT SESSION: Continue to progress hip strengthening exercises in non-pain provoking positions. Attempt weight bearing positions    Bradly Chris PT, DPT  11/16/21, 3:05 PM

## 2021-11-22 ENCOUNTER — Encounter: Payer: Medicare Other | Admitting: Physical Therapy

## 2021-11-22 ENCOUNTER — Telehealth: Payer: Self-pay | Admitting: Physical Therapy

## 2021-11-22 ENCOUNTER — Ambulatory Visit: Payer: Medicare Other | Admitting: Physical Therapy

## 2021-11-22 NOTE — Telephone Encounter (Signed)
Called pt to inquire about absence from apt. Did not reach so left VM instructing pt to call back to reschedule.

## 2021-11-27 ENCOUNTER — Ambulatory Visit: Payer: Medicare Other | Admitting: Physical Therapy

## 2021-11-29 ENCOUNTER — Encounter: Payer: Medicare Other | Admitting: Physical Therapy

## 2021-12-04 ENCOUNTER — Ambulatory Visit: Payer: Medicare Other | Admitting: Physical Therapy

## 2021-12-06 ENCOUNTER — Encounter: Payer: Medicare Other | Admitting: Physical Therapy

## 2021-12-11 ENCOUNTER — Ambulatory Visit: Payer: Medicare Other | Attending: Internal Medicine | Admitting: Physical Therapy

## 2021-12-11 ENCOUNTER — Telehealth: Payer: Self-pay | Admitting: Physical Therapy

## 2021-12-11 DIAGNOSIS — R262 Difficulty in walking, not elsewhere classified: Secondary | ICD-10-CM | POA: Insufficient documentation

## 2021-12-11 DIAGNOSIS — M6281 Muscle weakness (generalized): Secondary | ICD-10-CM | POA: Insufficient documentation

## 2021-12-11 DIAGNOSIS — R296 Repeated falls: Secondary | ICD-10-CM | POA: Insufficient documentation

## 2021-12-11 NOTE — Telephone Encounter (Signed)
Called pt to inquire about absence from PT. She reports forgetting about apt and that she had a fall about a week ago. PT reminded pt about next apt time.

## 2021-12-13 ENCOUNTER — Ambulatory Visit: Payer: Medicare Other | Admitting: Physical Therapy

## 2021-12-13 ENCOUNTER — Encounter: Payer: Self-pay | Admitting: Physical Therapy

## 2021-12-13 DIAGNOSIS — M6281 Muscle weakness (generalized): Secondary | ICD-10-CM | POA: Diagnosis present

## 2021-12-13 DIAGNOSIS — R296 Repeated falls: Secondary | ICD-10-CM | POA: Diagnosis present

## 2021-12-13 DIAGNOSIS — R262 Difficulty in walking, not elsewhere classified: Secondary | ICD-10-CM | POA: Diagnosis not present

## 2021-12-13 NOTE — Therapy (Addendum)
OUTPATIENT PHYSICAL THERAPY TREATMENT NOTE   Patient Name: Katie Neal MRN: [id number] DOB:[birth year], 51 y.o., female Today's Date: 12/13/2021   PT End of Session - 12/13/21 1026     Visit Number 6    Number of Visits 20    Date for PT Re-Evaluation 02/02/22    Authorization Type Medicare 2024    Authorization Time Period 12/03/21-02/02/22    Authorization - Visit Number 6    Authorization - Number of Visits 10    Progress Note Due on Visit 10    PT Start Time 1015    PT Stop Time 1100    PT Time Calculation (min) 45 min    Equipment Utilized During Treatment Gait belt    Activity Tolerance Patient limited by pain    Behavior During Therapy WFL for tasks assessed/performed              Past Medical History:  Diagnosis Date   Arthritis    Asthma    Cardiomegaly    Cerebral atrophy (HCC)    Complex regional pain syndrome type 1    COPD (chronic obstructive pulmonary disease) (HCC)    CRPS (complex regional pain syndrome type I)    Depression    Diaphragmatic hernia    Dyspnea    Excessive falling    Headache    History of chronic sinusitis    History of kidney stones    Hyperlipidemia    Hypothyroidism    IBS (irritable bowel syndrome)    Insomnia    Memory difficulty    Osteoarthritis of knee    Pneumonia    Post concussion syndrome    PTSD (post-traumatic stress disorder)    Seizure-like activity (HCC)    Sleep apnea    not on Cpap machine but is on oxygen    Urinary incontinence    Vasovagal syncope    Past Surgical History:  Procedure Laterality Date   ABDOMINAL HYSTERECTOMY     ACHILLES TENDON REPAIR  1982   APPENDECTOMY     CYSTOSCOPY WITH STENT PLACEMENT Right 06/27/2019   Procedure: CYSTOSCOPY WITH STENT PLACEMENT;  Surgeon: Crista Elliot, MD;  Location: [facility];  Service: Urology;  Laterality: Right;   CYSTOSCOPY/RETROGRADE/URETEROSCOPY  07/14/2019   Procedure: CYSTOSCOPY/RETROGRADE/URETEROSCOPY;  Surgeon: Riki Altes, MD;   Location: [facility];  Service: Urology;;   CYSTOSCOPY/URETEROSCOPY/HOLMIUM LASER/STENT PLACEMENT Right 07/14/2019   Procedure: CYSTOSCOPY/URETEROSCOPY/HOLMIUM LASER/STENT Exchange;  Surgeon: Riki Altes, MD;  Location: [facility];  Service: Urology;  Laterality: Right;   ESOPHAGOGASTRODUODENOSCOPY N/A 10/21/2020   Procedure: ESOPHAGOGASTRODUODENOSCOPY (EGD);  Surgeon: Regis Bill, MD;  Location: [facility];  Service: Endoscopy;  Laterality: N/A;   FOOT SURGERY  2002   due to deformity   HIP SURGERY  2012   KNEE ARTHROSCOPY     LAPAROSCOPIC HYSTERECTOMY  2000   REPAIR TENDONS LEG     x2   Patient Active Problem List   Diagnosis Date Noted   Elevated troponin 09/27/2019   IBS (irritable bowel syndrome) 08/07/2019   Fever    Right lower quadrant abdominal pain    Right ureteral stone 06/27/2019   Sepsis (HCC) 06/27/2019   Acute pyelonephritis 06/27/2019   Weakness    Seizure (HCC) 12/30/2018   CRPS (complex regional pain syndrome type I)    HLD (hyperlipidemia)    Lower urinary tract infectious disease    Fall    Hypothyroidism    Headache disorder 05/06/2018   Dizziness 01/30/2018  Post concussion syndrome 01/30/2018   Vasovagal syncope 11/27/2017   Seizure-like activity (HCC) 11/27/2017   Concussion with no loss of consciousness, initial encounter 10/23/2017   Excessive falling 10/23/2017   Urinary incontinence 10/23/2017   Chronic obstructive pulmonary disease (HCC) 09/09/2017   Obesity (BMI 35.0-39.9 without comorbidity) 06/07/2017   Transient neurological symptoms 05/24/2017   History of chronic sinusitis 12/21/2016   Abnormal PFTs 12/06/2015   Adjustment disorder with depressed mood 06/17/2015   Acute urinary retention 04/05/2015   Gross hematuria 04/05/2015   Acute non-recurrent maxillary sinusitis 10/20/2014   History of environmental allergies 07/08/2014   On home oxygen therapy 07/07/2014   Gastroparesis 06/28/2014   Pharyngoesophageal dysphagia  06/01/2014   ANA positive 05/11/2014   Anemia 05/11/2014   Shortness of breath 04/25/2014   Hypoxia 04/16/2014   Hiatal hernia 04/16/2014   Former tobacco use 04/16/2014   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   Hypoxemia requiring supplemental oxygen 03/22/2014   Globus sensation 03/20/2014   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   Midepigastric pain 03/20/2014   Pneumonia due to organism 03/19/2014   Tachycardia 03/02/2014   POTS (postural orthostatic tachycardia syndrome) 03/02/2014   Pain in joint, forearm 12/07/2013   Bilateral hand pain 10/28/2013   Insomnia 09/18/2013   High risk medication use 05/27/2013   Neurogenic pain of left foot 05/07/2013   Depression 04/08/2013   Major depressive disorder, recurrent episode, moderate (HCC) 04/08/2013   Closed fracture of metatarsal bone 02/16/2013   Low back pain 02/10/2013   OA (osteoarthritis) of knee 08/27/2012   Osteopenia 06/20/2012   H/O arthroplasty 09/20/2011   ACL graft tear (HCC) 08/02/2011   Sprain of medial collateral ligament of knee 08/02/2011    PCP: Dr. Steve RattlerSionee George   REFERRING PROVIDER: Dr. Marcelino DusterJohn Johnston   REFERRING DIAG: Ataxia   THERAPY DIAG:  Difficulty in walking, not elsewhere classified  Muscle weakness (generalized)  Repeated falls  Rationale for Evaluation and Treatment Rehabilitation  ONSET DATE: 01/2013   SUBJECTIVE:   SUBJECTIVE STATEMENT: Pt reports that she had a fall and COVID since last session. She fell on her right side and continues to feel sore on that side. She does not think that she sustained any broken bones from falls.   PERTINENT HISTORY:  Patient has an extensive medical history which she believes is contributing to her current falls. The main reason she believes she is having fall is because of chronic regional pain syndrome, which she was orginally diagnosed with in 2015. She is in a constant state of pain especially in LEs . She has h/o of partial  knee replacement on left knee as well as multiple ACL tears on left knee. She also feels dizzy all the time which she attributes to her low blood oxygen levels and low blood pressure.   PAIN:  Are you having pain? 4-5/10 in bilateral LE ankles and feet    PRECAUTIONS: Fall  WEIGHT BEARING RESTRICTIONS No  FALLS:  Has patient fallen in last 6 months? Yes. Number of falls 8-9  LIVING ENVIRONMENT: Lives with: lives with their son Lives in: House/apartment Stairs: No Has following equipment at home:  Upright walker and SPC and [place] crutches and Manual WC    OCCUPATION: Disabled   PLOF: Needs assistance with ADLs  PATIENT GOALS To get a wheelchair evaluation for an electric WC. Increase ability to sit and stand longer and stop falling.    OBJECTIVE:   VITALS: BP 115/75 HR 96 SpO2 95  DIAGNOSTIC FINDINGS:   CLINICAL DATA:  Seizure.  Projectile vomiting.   EXAM: CT HEAD WITHOUT CONTRAST   TECHNIQUE: Contiguous axial images were obtained from the base of the skull through the vertex without intravenous contrast.   COMPARISON:  12/30/2018   FINDINGS: Brain: Age advanced cerebral atrophy again identified. No mass lesion, hemorrhage, hydrocephalus, acute infarct, intra-axial, or extra-axial fluid collection.   Vascular: No hyperdense vessel or unexpected calcification.   Skull: No significant soft tissue swelling.  No skull fracture.   Sinuses/Orbits: Normal imaged portions of the orbits and globes. Mucosal thickening of ethmoid air cells.   Other: None.   IMPRESSION: 1. No acute intracranial abnormality. 2. Age advanced cerebral atrophy. 3. Sinus disease.  PATIENT SURVEYS:  FOTO 34/100; Goal 40  COGNITION: Overall cognitive status: Within functional limits for tasks assessed     SENSATION: Light touch: Impaired Numbness and tingling around entire leg   EDEMA: Not evidenced    MUSCLE LENGTH: Not evidenced   POSTURE: rounded shoulders and forward  head  PALPATION: Not performed   LOWER EXTREMITY ROM:  WFL   LOWER EXTREMITY MMT:  MMT Right eval Left eval  Hip flexion 3+ 3+   Hip extension    Hip abduction 3 3+  Hip adduction 3 3  Hip internal rotation    Hip external rotation    Knee flexion 3+ 3+  Knee extension 3+ 3+  Ankle dorsiflexion    Ankle plantarflexion    Ankle inversion    Ankle eversion     (Blank rows = not tested)   FUNCTIONAL TESTS:  5 times sit to stand: 26 sec  30 seconds chair stand test: 6 reps  Berg Balance Scale: NT   STEADI 4-Stage Balance Test: (inability to maintain tandem stance for 10 sec = increased risk for falls) - feet together, eyes open, noncompliant surface: 10 sec - semi-tandem stance, eyes open, noncompliant surface: 3/3 sec - tandem stances, eyes open, noncompliant surface: NT  - single leg stance:         - R LE: NT          - L LE: NT   GAIT: Distance walked: 23 ft  Assistive device utilized: None Level of assistance: SBA Comments: Wide base of support and shuffling gait    TODAY'S TREATMENT:  12/14/22: Nu-Step with seat and arms 8 with resistance at 1 for 5 min  Seated Marches 1 x 10  Supine Marches 2 x 10  Lower Trunk Rotations 2 x 10  Supine Clam Shell with yellow band 2 x 10   Discussion about pain management and potential physicians that are closed to where she lives in [place], [name].   11/16/21: Seated Marches 3 x 10  Ankle Pumps 3 x 10  Supine Bridges 3 x 10  Lower Trunk Rotation 3 x 10  Long Sitting HS Stretch 3 x 30 sec  Side Lying Calm Shell 4 x 5    11/08/21: Nu-Step Level 1 and Seat and Arms 6 FOR 5 min  Supine Bridges 3 x 5  SLR 3 x 5  Seated Hip Abduction with red band 3 x 5   10/23/21:  MMT DF R/L 4/4*  *Painful in left foot due to prior breaks   Supine Bridges 3 x 5  Lower Trunk Rotation 2 x 10  SLR 3 x 5  SLR 1 x 5 with wedge to prevent supine position because of GERD  Seated Hip Abduction with Red band 3 x  5  -min VC to  come back slow   10/16/21  TherAct:  Assessment of tests and measures for goal assessment.  FOTO: 34/100 BERG: 44/56 TUG: 25.21 sec   TherEx:  Standing mini squats with B UE support, x10 Standing marches with B UE support, x10 Standing hip abduction with B UE support, x10 each LE Standing hip extension with B UE support, x10 each LE Standing calf raises with B UE support, x10    PATIENT EDUCATION:  Education details: form and technique for appropriate exercise and explanation about tests and measures  Person educated: Patient Education method: Medical illustrator Education comprehension: verbalized understanding and returned demonstration   HOME EXERCISE PROGRAM:  Access Code: [email or web address] URL: [email or web address] Date: 12/13/2021 Prepared by: Ellin Goodie  Exercises - Supine Lower Trunk Rotation  - 1 x daily - 3 x weekly - 3 sets - 10 reps - Supine Bridge  - 1 x daily - 3 x weekly - 3 sets - 5 reps - Active Straight Leg Raise with Quad Set  - 1 x daily - 3 x weekly - 3 sets - 5 reps - Clamshell  - 3 x weekly - 3 sets - 5 reps  ASSESSMENT:  CLINICAL IMPRESSION:  Pt recovering from recent fall that did not result in traumatic injury but did result in muscle soreness on right side of body. Pt able to complete all exercises without an increase in her pain. Session limited to supine exercises to avoid exacerbation of right sided pain especially after pt reporting increased right knee pain with seated activity. PT discussed pain clinics closer to home giving pt contact of Dr. Cherylann Ratel. She will continue to benefit from skilled therapy to address related balance issue and strength deficits likely contributing to balance complications.  OBJECTIVE IMPAIRMENTS Abnormal gait, decreased activity tolerance, decreased balance, decreased mobility, difficulty walking, decreased strength, dizziness, impaired sensation, and pain.   ACTIVITY LIMITATIONS carrying,  lifting, bending, sitting, standing, squatting, sleeping, stairs, transfers, bed mobility, dressing, and hygiene/grooming  PARTICIPATION LIMITATIONS: meal prep, cleaning, laundry, driving, shopping, community activity, and yard work  PERSONAL FACTORS Age, Fitness, Past/current experiences, Time since onset of injury/illness/exacerbation, and 3+ comorbidities: Complex Regional Pain Syndrome, Depression, Seizure like Activity, COPD   are also affecting patient's functional outcome.   REHAB POTENTIAL: Fair Multiple bouts of PT in past and chronicity of ongoing neurological and MSK issues   CLINICAL DECISION MAKING: Evolving/moderate complexity  EVALUATION COMPLEXITY: Moderate   GOALS: Goals reviewed with patient? No  SHORT TERM GOALS: Target date: 12/27/2021  Pt will be independent with HEP in order to improve strength and balance in order to decrease fall risk and improve function at home and work. Baseline: NT  Goal status: IN PROGRESS  2.  Pt will decrease 5TSTS by at least 3 seconds in order to demonstrate clinically significant improvement in LE strength. Baseline: 23 sec   Goal status: IN PROGRESS    LONG TERM GOALS: Target date: 02/21/2022   Patient will have improved function and activity level as evidenced by an increase in FOTO score by 10 points or more.  Baseline: 9/25 FOTO: 34/100  Goal status: IN PROGRESS  2. Pt will improve BERG by at least 3 points in order to demonstrate clinically significant improvement in balance.  Baseline: 9/25: 44/56 Goal status: IN PROGRESS   3.  Pt will decrease TUG to below 14 seconds in order to demonstrate decreased fall risk. Baseline: 9/25: 25.21 sec Goal status: IN PROGRESS  4.  Patient will be able to perform tandem stance on RLE and LLE to show improved static balance and decreased risk of falling.  Baseline: Romberg  Goal status: INITIAL    PLAN: PT FREQUENCY: 1-2x/week  PT DURATION: 10 weeks  PLANNED INTERVENTIONS:  Therapeutic exercises, Therapeutic activity, Neuromuscular re-education, Balance training, Gait training, Patient/Family education, Self Care, Joint mobilization, Stair training, Vestibular training, DME instructions, Aquatic Therapy, Dry Needling, Electrical stimulation, Spinal manipulation, Spinal mobilization, Cryotherapy, Moist heat, Manual therapy, and Re-evaluation  PLAN FOR NEXT SESSION: Reassess goals and take FOTO. Continue to progress hip strengthening exercises in non-pain provoking positions. Do reduced reps 5 instead of 10 and less sets and gauge pain response to activity. Attempt balance activity with pain permitting.  See physiopedia for article on CRPS: https://www.physio-pedia.com/Complex_Regional_Pain_Syndrome_(CRPS)   Ellin Goodie PT, DPT  12/13/21, 10:29 AM

## 2021-12-13 NOTE — Addendum Note (Signed)
Addended by: Johnn Hai on: 12/13/2021 10:14 AM   Modules accepted: Orders

## 2021-12-21 ENCOUNTER — Ambulatory Visit: Payer: Medicare Other | Admitting: Physical Therapy

## 2021-12-28 ENCOUNTER — Ambulatory Visit: Payer: Medicare Other | Attending: Internal Medicine | Admitting: Physical Therapy

## 2021-12-28 DIAGNOSIS — M6281 Muscle weakness (generalized): Secondary | ICD-10-CM | POA: Insufficient documentation

## 2021-12-28 DIAGNOSIS — R262 Difficulty in walking, not elsewhere classified: Secondary | ICD-10-CM | POA: Insufficient documentation

## 2021-12-28 DIAGNOSIS — R296 Repeated falls: Secondary | ICD-10-CM | POA: Insufficient documentation

## 2022-01-04 ENCOUNTER — Ambulatory Visit: Payer: Medicare Other

## 2022-01-04 ENCOUNTER — Encounter: Payer: Self-pay | Admitting: Physical Therapy

## 2022-01-04 DIAGNOSIS — M6281 Muscle weakness (generalized): Secondary | ICD-10-CM

## 2022-01-04 DIAGNOSIS — R296 Repeated falls: Secondary | ICD-10-CM

## 2022-01-04 DIAGNOSIS — R262 Difficulty in walking, not elsewhere classified: Secondary | ICD-10-CM | POA: Diagnosis present

## 2022-01-04 NOTE — Therapy (Signed)
OUTPATIENT PHYSICAL THERAPY TREATMENT NOTE   Patient Name: Katie Neal MRN: [id number] DOB:[birth year], 51 y.o., female Today's Date: 01/04/2022   PT End of Session - 01/04/22 1115     Visit Number 7    Number of Visits 20    Date for PT Re-Evaluation 02/02/22    Authorization Type Medicare 2024    Authorization Time Period 12/03/21-02/02/22    Authorization - Visit Number 7    Authorization - Number of Visits 10    Progress Note Due on Visit 10    PT Start Time 1112    PT Stop Time 1145    PT Time Calculation (min) 33 min    Equipment Utilized During Treatment Gait belt    Activity Tolerance Patient limited by pain    Behavior During Therapy WFL for tasks assessed/performed              Past Medical History:  Diagnosis Date   Arthritis    Asthma    Cardiomegaly    Cerebral atrophy (HCC)    Complex regional pain syndrome type 1    COPD (chronic obstructive pulmonary disease) (HCC)    CRPS (complex regional pain syndrome type I)    Depression    Diaphragmatic hernia    Dyspnea    Excessive falling    Headache    History of chronic sinusitis    History of kidney stones    Hyperlipidemia    Hypothyroidism    IBS (irritable bowel syndrome)    Insomnia    Memory difficulty    Osteoarthritis of knee    Pneumonia    Post concussion syndrome    PTSD (post-traumatic stress disorder)    Seizure-like activity (HCC)    Sleep apnea    not on Cpap machine but is on oxygen    Urinary incontinence    Vasovagal syncope    Past Surgical History:  Procedure Laterality Date   ABDOMINAL HYSTERECTOMY     ACHILLES TENDON REPAIR  1982   APPENDECTOMY     CYSTOSCOPY WITH STENT PLACEMENT Right 06/27/2019   Procedure: CYSTOSCOPY WITH STENT PLACEMENT;  Surgeon: Crista Elliot, MD;  Location: [facility];  Service: Urology;  Laterality: Right;   CYSTOSCOPY/RETROGRADE/URETEROSCOPY  07/14/2019   Procedure: CYSTOSCOPY/RETROGRADE/URETEROSCOPY;  Surgeon: Riki Altes, MD;   Location: [facility];  Service: Urology;;   CYSTOSCOPY/URETEROSCOPY/HOLMIUM LASER/STENT PLACEMENT Right 07/14/2019   Procedure: CYSTOSCOPY/URETEROSCOPY/HOLMIUM LASER/STENT Exchange;  Surgeon: Riki Altes, MD;  Location: [facility];  Service: Urology;  Laterality: Right;   ESOPHAGOGASTRODUODENOSCOPY N/A 10/21/2020   Procedure: ESOPHAGOGASTRODUODENOSCOPY (EGD);  Surgeon: Regis Bill, MD;  Location: [facility] END[facility]COPY;  Service: Endoscopy;  Laterality: N/A;   FOOT SURGERY  2002   due to deformity   HIP SURGERY  2012   KNEE ARTHROSCOPY     LAPAROSCOPIC HYSTERECTOMY  2000   REPAIR TENDONS LEG     x2   Patient Active Problem List   Diagnosis Date Noted   Elevated troponin 09/27/2019   IBS (irritable bowel syndrome) 08/07/2019   Fever    Right lower quadrant abdominal pain    Right ureteral stone 06/27/2019   Sepsis (HCC) 06/27/2019   Acute pyelonephritis 06/27/2019   Weakness    Seizure (HCC) 12/30/2018   CRPS (complex regional pain syndrome type I)    HLD (hyperlipidemia)    Lower urinary tract infectious disease    Fall    Hypothyroidism    Headache disorder 05/06/2018   Dizziness 01/30/2018  Post concussion syndrome 01/30/2018   Vasovagal syncope 11/27/2017   Seizure-like activity (HCC) 11/27/2017   Concussion with no loss of consciousness, initial encounter 10/23/2017   Excessive falling 10/23/2017   Urinary incontinence 10/23/2017   Chronic obstructive pulmonary disease (HCC) 09/09/2017   Obesity (BMI 35.0-39.9 without comorbidity) 06/07/2017   Transient neurological symptoms 05/24/2017   History of chronic sinusitis 12/21/2016   Abnormal PFTs 12/06/2015   Adjustment disorder with depressed mood 06/17/2015   Acute urinary retention 04/05/2015   Gross hematuria 04/05/2015   Acute non-recurrent maxillary sinusitis 10/20/2014   History of environmental allergies 07/08/2014   On home oxygen therapy 07/07/2014   Gastroparesis 06/28/2014   Pharyngoesophageal dysphagia  06/01/2014   ANA positive 05/11/2014   Anemia 05/11/2014   Shortness of breath 04/25/2014   Hypoxia 04/16/2014   Hiatal hernia 04/16/2014   Former tobacco use 04/16/2014   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   Hypoxemia requiring supplemental oxygen 03/22/2014   Globus sensation 03/20/2014   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   Midepigastric pain 03/20/2014   Pneumonia due to organism 03/19/2014   Tachycardia 03/02/2014   POTS (postural orthostatic tachycardia syndrome) 03/02/2014   Pain in joint, forearm 12/07/2013   Bilateral hand pain 10/28/2013   Insomnia 09/18/2013   High risk medication use 05/27/2013   Neurogenic pain of left foot 05/07/2013   Depression 04/08/2013   Major depressive disorder, recurrent episode, moderate (HCC) 04/08/2013   Closed fracture of metatarsal bone 02/16/2013   Low back pain 02/10/2013   OA (osteoarthritis) of knee 08/27/2012   Osteopenia 06/20/2012   H/O arthroplasty 09/20/2011   ACL graft tear (HCC) 08/02/2011   Sprain of medial collateral ligament of knee 08/02/2011    PCP: Dr. Steve Rattler   REFERRING PROVIDER: Dr. Marcelino Duster   REFERRING DIAG: Ataxia   THERAPY DIAG:  Difficulty in walking, not elsewhere classified  Repeated falls  Muscle weakness (generalized)  Rationale for Evaluation and Treatment Rehabilitation  ONSET DATE: 01/2013   SUBJECTIVE:   SUBJECTIVE STATEMENT: Pt denies falls. Current pain 8/10 NPS. Frequent stumbles however no falls.  Somewhat compliant with HEP depending on her pain levels.   PERTINENT HISTORY:  Patient has an extensive medical history which she believes is contributing to her current falls. The main reason she believes she is having fall is because of chronic regional pain syndrome, which she was orginally diagnosed with in 2015. She is in a constant state of pain especially in LEs . She has h/o of partial knee replacement on left knee as well as multiple ACL tears on  left knee. She also feels dizzy all the time which she attributes to her low blood oxygen levels and low blood pressure.   PAIN:  Are you having pain? 8/10 in bilateral LE ankles and feet    PRECAUTIONS: Fall  WEIGHT BEARING RESTRICTIONS No  FALLS:  Has patient fallen in last 6 months? Yes. Number of falls 8-9  LIVING ENVIRONMENT: Lives with: lives with their son Lives in: House/apartment Stairs: No Has following equipment at home:  Upright walker and SPC and [place] crutches and Manual WC    OCCUPATION: Disabled   PLOF: Needs assistance with ADLs  PATIENT GOALS To get a wheelchair evaluation for an electric WC. Increase ability to sit and stand longer and stop falling.    OBJECTIVE:   VITALS: BP 115/75 HR 96 SpO2 95   DIAGNOSTIC FINDINGS:   CLINICAL DATA:  Seizure.  Projectile vomiting.   EXAM: CT  HEAD WITHOUT CONTRAST   TECHNIQUE: Contiguous axial images were obtained from the base of the skull through the vertex without intravenous contrast.   COMPARISON:  12/30/2018   FINDINGS: Brain: Age advanced cerebral atrophy again identified. No mass lesion, hemorrhage, hydrocephalus, acute infarct, intra-axial, or extra-axial fluid collection.   Vascular: No hyperdense vessel or unexpected calcification.   Skull: No significant soft tissue swelling.  No skull fracture.   Sinuses/Orbits: Normal imaged portions of the orbits and globes. Mucosal thickening of ethmoid air cells.   Other: None.   IMPRESSION: 1. No acute intracranial abnormality. 2. Age advanced cerebral atrophy. 3. Sinus disease.  PATIENT SURVEYS:  FOTO 34/100; Goal 40  COGNITION: Overall cognitive status: Within functional limits for tasks assessed     SENSATION: Light touch: Impaired Numbness and tingling around entire leg   EDEMA: Not evidenced    MUSCLE LENGTH: Not evidenced   POSTURE: rounded shoulders and forward head  PALPATION: Not performed   LOWER EXTREMITY ROM:  WFL    LOWER EXTREMITY MMT:  MMT Right eval Left eval  Hip flexion 3+ 3+   Hip extension    Hip abduction 3 3+  Hip adduction 3 3  Hip internal rotation    Hip external rotation    Knee flexion 3+ 3+  Knee extension 3+ 3+  Ankle dorsiflexion    Ankle plantarflexion    Ankle inversion    Ankle eversion     (Blank rows = not tested)   FUNCTIONAL TESTS:  5 times sit to stand: 26 sec  30 seconds chair stand test: 6 reps  Berg Balance Scale: NT   STEADI 4-Stage Balance Test: (inability to maintain tandem stance for 10 sec = increased risk for falls) - feet together, eyes open, noncompliant surface: 10 sec - semi-tandem stance, eyes open, noncompliant surface: 3/3 sec - tandem stances, eyes open, noncompliant surface: NT  - single leg stance:         - R LE: NT          - L LE: NT   GAIT: Distance walked: 23 ft  Assistive device utilized: None Level of assistance: SBA Comments: Wide base of support and shuffling gait    TODAY'S TREATMENT:  01/04/22: Nu-Step with seat and arms 8 with resistance at 1 for 5 min   Seated exercise: 3x10/side  Marches  Heel to toe raises  LAQ's  Clam shells with YTB at distal femurs  Hook lying exercise:   LTR's: x20/side   Hamstring curls with foot on furniture slider: x20/LE     PATIENT EDUCATION:  Education details: form and technique for appropriate exercise and explanation about tests and measures  Person educated: Patient Education method: Medical illustrator Education comprehension: verbalized understanding and returned demonstration   HOME EXERCISE PROGRAM:  Access Code: [email or web address] URL: [email or web address] Date: 12/13/2021 Prepared by: Ellin Goodie  Exercises - Supine Lower Trunk Rotation  - 1 x daily - 3 x weekly - 3 sets - 10 reps - Supine Bridge  - 1 x daily - 3 x weekly - 3 sets - 5 reps - Active Straight Leg Raise with Quad Set  - 1 x daily - 3 x weekly - 3 sets - 5 reps - Clamshell   - 3 x weekly - 3 sets - 5 reps  ASSESSMENT:  CLINICAL IMPRESSION: Continuing PT POC with seated and supine therex due to poor tolerance for standing therex. Pt late to session so overall session limited in time but  no increased pain throughout session this date. Pt has pain clinic appointment in [place] today 01/04/22 and plans on updating primary PT on how the appointment goes per last session discussion. Pt been limited in attendance due to frequent illness limiting progress in POC but will continue to benefit from consistent skilled PT services to address deficits to reduce falls risk.  OBJECTIVE IMPAIRMENTS Abnormal gait, decreased activity tolerance, decreased balance, decreased mobility, difficulty walking, decreased strength, dizziness, impaired sensation, and pain.   ACTIVITY LIMITATIONS carrying, lifting, bending, sitting, standing, squatting, sleeping, stairs, transfers, bed mobility, dressing, and hygiene/grooming  PARTICIPATION LIMITATIONS: meal prep, cleaning, laundry, driving, shopping, community activity, and yard work  PERSONAL FACTORS Age, Fitness, Past/current experiences, Time since onset of injury/illness/exacerbation, and 3+ comorbidities: Complex Regional Pain Syndrome, Depression, Seizure like Activity, COPD   are also affecting patient's functional outcome.   REHAB POTENTIAL: Fair Multiple bouts of PT in past and chronicity of ongoing neurological and MSK issues   CLINICAL DECISION MAKING: Evolving/moderate complexity  EVALUATION COMPLEXITY: Moderate   GOALS: Goals reviewed with patient? No  SHORT TERM GOALS: Target date: 01/18/2022  Pt will be independent with HEP in order to improve strength and balance in order to decrease fall risk and improve function at home and work. Baseline: NT  Goal status: IN PROGRESS  2.  Pt will decrease 5TSTS by at least 3 seconds in order to demonstrate clinically significant improvement in LE strength. Baseline: 23 sec   Goal  status: IN PROGRESS    LONG TERM GOALS: Target date: 03/15/2022   Patient will have improved function and activity level as evidenced by an increase in FOTO score by 10 points or more.  Baseline: 9/25 FOTO: 34/100  Goal status: IN PROGRESS  2. Pt will improve BERG by at least 3 points in order to demonstrate clinically significant improvement in balance.  Baseline: 9/25: 44/56 Goal status: IN PROGRESS   3.  Pt will decrease TUG to below 14 seconds in order to demonstrate decreased fall risk. Baseline: 9/25: 25.21 sec Goal status: IN PROGRESS   4.  Patient will be able to perform tandem stance on RLE and LLE to show improved static balance and decreased risk of falling.  Baseline: Romberg  Goal status: INITIAL    PLAN: PT FREQUENCY: 1-2x/week  PT DURATION: 10 weeks  PLANNED INTERVENTIONS: Therapeutic exercises, Therapeutic activity, Neuromuscular re-education, Balance training, Gait training, Patient/Family education, Self Care, Joint mobilization, Stair training, Vestibular training, DME instructions, Aquatic Therapy, Dry Needling, Electrical stimulation, Spinal manipulation, Spinal mobilization, Cryotherapy, Moist heat, Manual therapy, and Re-evaluation  PLAN FOR NEXT SESSION: Reassess goals and take FOTO. Continue to progress hip strengthening exercises in non-pain provoking positions. Do reduced reps 5 instead of 10 and less sets and gauge pain response to activity. Attempt balance activity with pain permitting.  See physiopedia for article on CRPS: [email or web address]-pedia.com/Complex_Regional_Pain_Syndrome_(CRPS)  Delphia GratesMilton M. Fairly IV, PT, DPT Physical Therapist- [place]  [facility]  01/04/22, 1:21 PM

## 2022-01-11 ENCOUNTER — Ambulatory Visit: Payer: Medicare Other | Admitting: Physical Therapy

## 2022-01-11 ENCOUNTER — Encounter: Payer: Self-pay | Admitting: Physical Therapy

## 2022-01-11 DIAGNOSIS — R296 Repeated falls: Secondary | ICD-10-CM

## 2022-01-11 DIAGNOSIS — R262 Difficulty in walking, not elsewhere classified: Secondary | ICD-10-CM

## 2022-01-11 DIAGNOSIS — M6281 Muscle weakness (generalized): Secondary | ICD-10-CM

## 2022-01-11 NOTE — Therapy (Signed)
OUTPATIENT PHYSICAL THERAPY TREATMENT NOTE   Patient Name: Katie Neal MRN: [id number] DOB:15-Dec-1970, 51 y.o., female Today's Date: 01/04/2022   PT End of Session - 01/04/22 1115     Visit Number 7    Number of Visits 20    Date for PT Re-Evaluation 02/02/22    Authorization Type Medicare 2024    Authorization Time Period 12/03/21-02/02/22    Authorization - Visit Number 7    Authorization - Number of Visits 10    Progress Note Due on Visit 10    PT Start Time 1112    PT Stop Time 1145    PT Time Calculation (min) 33 min    Equipment Utilized During Treatment Gait belt    Activity Tolerance Patient limited by pain    Behavior During Therapy WFL for tasks assessed/performed              Past Medical History:  Diagnosis Date   Arthritis    Asthma    Cardiomegaly    Cerebral atrophy (HCC)    Complex regional pain syndrome type 1    COPD (chronic obstructive pulmonary disease) (HCC)    CRPS (complex regional pain syndrome type I)    Depression    Diaphragmatic hernia    Dyspnea    Excessive falling    Headache    History of chronic sinusitis    History of kidney stones    Hyperlipidemia    Hypothyroidism    IBS (irritable bowel syndrome)    Insomnia    Memory difficulty    Osteoarthritis of knee    Pneumonia    Post concussion syndrome    PTSD (post-traumatic stress disorder)    Seizure-like activity (HCC)    Sleep apnea    not on Cpap machine but is on oxygen    Urinary incontinence    Vasovagal syncope    Past Surgical History:  Procedure Laterality Date   ABDOMINAL HYSTERECTOMY     ACHILLES TENDON REPAIR  1982   APPENDECTOMY     CYSTOSCOPY WITH STENT PLACEMENT Right 06/27/2019   Procedure: CYSTOSCOPY WITH STENT PLACEMENT;  Surgeon: Crista Elliot, MD;  Location: [facility];  Service: Urology;  Laterality: Right;   CYSTOSCOPY/RETROGRADE/URETEROSCOPY  07/14/2019   Procedure: CYSTOSCOPY/RETROGRADE/URETEROSCOPY;  Surgeon: Riki Altes, MD;   Location: [facility];  Service: Urology;;   CYSTOSCOPY/URETEROSCOPY/HOLMIUM LASER/STENT PLACEMENT Right 07/14/2019   Procedure: CYSTOSCOPY/URETEROSCOPY/HOLMIUM LASER/STENT Exchange;  Surgeon: Riki Altes, MD;  Location: [facility];  Service: Urology;  Laterality: Right;   ESOPHAGOGASTRODUODENOSCOPY N/A 10/21/2020   Procedure: ESOPHAGOGASTRODUODENOSCOPY (EGD);  Surgeon: Regis Bill, MD;  Location: [facility];  Service: Endoscopy;  Laterality: N/A;   FOOT SURGERY  2002   due to deformity   HIP SURGERY  2012   KNEE ARTHROSCOPY     LAPAROSCOPIC HYSTERECTOMY  2000   REPAIR TENDONS LEG     x2   Patient Active Problem List   Diagnosis Date Noted   Elevated troponin 09/27/2019   IBS (irritable bowel syndrome) 08/07/2019   Fever    Right lower quadrant abdominal pain    Right ureteral stone 06/27/2019   Sepsis (HCC) 06/27/2019   Acute pyelonephritis 06/27/2019   Weakness    Seizure (HCC) 12/30/2018   CRPS (complex regional pain syndrome type I)    HLD (hyperlipidemia)    Lower urinary tract infectious disease    Fall    Hypothyroidism    Headache disorder 05/06/2018   Dizziness 01/30/2018  Post concussion syndrome 01/30/2018   Vasovagal syncope 11/27/2017   Seizure-like activity (HCC) 11/27/2017   Concussion with no loss of consciousness, initial encounter 10/23/2017   Excessive falling 10/23/2017   Urinary incontinence 10/23/2017   Chronic obstructive pulmonary disease (HCC) 09/09/2017   Obesity (BMI 35.0-39.9 without comorbidity) 06/07/2017   Transient neurological symptoms 05/24/2017   History of chronic sinusitis 12/21/2016   Abnormal PFTs 12/06/2015   Adjustment disorder with depressed mood 06/17/2015   Acute urinary retention 04/05/2015   Gross hematuria 04/05/2015   Acute non-recurrent maxillary sinusitis 10/20/2014   History of environmental allergies 07/08/2014   On home oxygen therapy 07/07/2014   Gastroparesis 06/28/2014   Pharyngoesophageal dysphagia  06/01/2014   ANA positive 05/11/2014   Anemia 05/11/2014   Shortness of breath 04/25/2014   Hypoxia 04/16/2014   Hiatal hernia 04/16/2014   Former tobacco use 04/16/2014   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   Hypoxemia requiring supplemental oxygen 03/22/2014   Globus sensation 03/20/2014   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   Midepigastric pain 03/20/2014   Pneumonia due to organism 03/19/2014   Tachycardia 03/02/2014   POTS (postural orthostatic tachycardia syndrome) 03/02/2014   Pain in joint, forearm 12/07/2013   Bilateral hand pain 10/28/2013   Insomnia 09/18/2013   High risk medication use 05/27/2013   Neurogenic pain of left foot 05/07/2013   Depression 04/08/2013   Major depressive disorder, recurrent episode, moderate (HCC) 04/08/2013   Closed fracture of metatarsal bone 02/16/2013   Low back pain 02/10/2013   OA (osteoarthritis) of knee 08/27/2012   Osteopenia 06/20/2012   H/O arthroplasty 09/20/2011   ACL graft tear (HCC) 08/02/2011   Sprain of medial collateral ligament of knee 08/02/2011    PCP: Dr. Steve RattlerSionee George   REFERRING PROVIDER: Dr. Marcelino DusterJohn Johnston   REFERRING DIAG: Ataxia   THERAPY DIAG:  Difficulty in walking, not elsewhere classified  Repeated falls  Muscle weakness (generalized)  Rationale for Evaluation and Treatment Rehabilitation  ONSET DATE: 01/2013   SUBJECTIVE:   SUBJECTIVE STATEMENT: Pt reports increased NPS of 8/10 due to holiday stress especially in left ankle and right IT band. She feels like right IT band feels tight as well.   PERTINENT HISTORY:  Patient has an extensive medical history which she believes is contributing to her current falls. The main reason she believes she is having fall is because of chronic regional pain syndrome, which she was orginally diagnosed with in 2015. She is in a constant state of pain especially in LEs . She has h/o of partial knee replacement on left knee as well as multiple  ACL tears on left knee. She also feels dizzy all the time which she attributes to her low blood oxygen levels and low blood pressure.   PAIN:  Are you having pain? 8/10 in bilateral LE ankles and feet    PRECAUTIONS: Fall  WEIGHT BEARING RESTRICTIONS No  FALLS:  Has patient fallen in last 6 months? Yes. Number of falls 8-9  LIVING ENVIRONMENT: Lives with: lives with their son Lives in: House/apartment Stairs: No Has following equipment at home:  Upright walker and SPC and [place] crutches and Manual WC    OCCUPATION: Disabled   PLOF: Needs assistance with ADLs  PATIENT GOALS To get a wheelchair evaluation for an electric WC. Increase ability to sit and stand longer and stop falling.    OBJECTIVE:   VITALS: BP 115/75 HR 96 SpO2 95   DIAGNOSTIC FINDINGS:   CLINICAL DATA:  Seizure.  Projectile vomiting.   EXAM: CT HEAD WITHOUT CONTRAST   TECHNIQUE: Contiguous axial images were obtained from the base of the skull through the vertex without intravenous contrast.   COMPARISON:  12/30/2018   FINDINGS: Brain: Age advanced cerebral atrophy again identified. No mass lesion, hemorrhage, hydrocephalus, acute infarct, intra-axial, or extra-axial fluid collection.   Vascular: No hyperdense vessel or unexpected calcification.   Skull: No significant soft tissue swelling.  No skull fracture.   Sinuses/Orbits: Normal imaged portions of the orbits and globes. Mucosal thickening of ethmoid air cells.   Other: None.   IMPRESSION: 1. No acute intracranial abnormality. 2. Age advanced cerebral atrophy. 3. Sinus disease.  PATIENT SURVEYS:  FOTO 34/100; Goal 40  COGNITION: Overall cognitive status: Within functional limits for tasks assessed     SENSATION: Light touch: Impaired Numbness and tingling around entire leg   EDEMA: Not evidenced    MUSCLE LENGTH: Not evidenced   POSTURE: rounded shoulders and forward head  PALPATION: Not performed   LOWER  EXTREMITY ROM:  WFL   LOWER EXTREMITY MMT:  MMT Right eval Left eval  Hip flexion 3+ 3+   Hip extension    Hip abduction 3 3+  Hip adduction 3 3  Hip internal rotation    Hip external rotation    Knee flexion 3+ 3+  Knee extension 3+ 3+  Ankle dorsiflexion    Ankle plantarflexion    Ankle inversion    Ankle eversion     (Blank rows = not tested)   FUNCTIONAL TESTS:  5 times sit to stand: 26 sec  30 seconds chair stand test: 6 reps  Berg Balance Scale: NT   STEADI 4-Stage Balance Test: (inability to maintain tandem stance for 10 sec = increased risk for falls) - feet together, eyes open, noncompliant surface: 10 sec - semi-tandem stance, eyes open, noncompliant surface: 3/3 sec - tandem stances, eyes open, noncompliant surface: NT  - single leg stance:         - R LE: NT          - L LE: NT   GAIT: Distance walked: 23 ft  Assistive device utilized: None Level of assistance: SBA Comments: Wide base of support and shuffling gait    TODAY'S TREATMENT:  01/11/22: Lower trunk rotation 3 x 10  Supine Bridges 3 x 5  Supine Marches 3 x 10   Side Lying Hip Abduction with right 1 x 60 sec  -Pain is too much for pt to complete   Supine IT band stretch to left 5 x 10 sec  Supine Hip Abduction 3 x 5   01/04/22: Nu-Step with seat and arms 8 with resistance at 1 for 5 min   Seated exercise: 3x10/side  Marches  Heel to toe raises  LAQ's  Clam shells with YTB at distal femurs  Hook lying exercise:   LTR's: x20/side   Hamstring curls with foot on furniture slider: x20/LE     PATIENT EDUCATION:  Education details: form and technique for appropriate exercise and explanation about tests and measures  Person educated: Patient Education method: Medical illustrator Education comprehension: verbalized understanding and returned demonstration   HOME EXERCISE PROGRAM:  Access Code: [email or web address] URL: [email or web address] Date:  01/11/2022 Prepared by: Ellin Goodie  Exercises - Supine Lower Trunk Rotation  - 1 x daily - 3 x weekly - 3 sets - 10 reps - Supine Bridge  - 1 x daily - 3 x weekly - 3 sets - 5 reps -  Active Straight Leg Raise with Quad Set  - 1 x daily - 3 x weekly - 3 sets - 5 reps - Clamshell  - 3 x weekly - 3 sets - 5 reps - Supine ITB Stretch  - 1 x daily - 3 reps - 30 sec  hold - Supine Hip Abduction  - 3 x weekly - 3 sets - 5 reps   ASSESSMENT:  CLINICAL IMPRESSION:  Session limited to supine exercises because of pain flare. She was able to perform all exercises without an increase in her pain with exception of side lying IT band stretch. She will continue to benefit form consistent skilled PT services to address deficits to reduce falls risk.  OBJECTIVE IMPAIRMENTS Abnormal gait, decreased activity tolerance, decreased balance, decreased mobility, difficulty walking, decreased strength, dizziness, impaired sensation, and pain.   ACTIVITY LIMITATIONS carrying, lifting, bending, sitting, standing, squatting, sleeping, stairs, transfers, bed mobility, dressing, and hygiene/grooming  PARTICIPATION LIMITATIONS: meal prep, cleaning, laundry, driving, shopping, community activity, and yard work  PERSONAL FACTORS Age, Fitness, Past/current experiences, Time since onset of injury/illness/exacerbation, and 3+ comorbidities: Complex Regional Pain Syndrome, Depression, Seizure like Activity, COPD   are also affecting patient's functional outcome.   REHAB POTENTIAL: Fair Multiple bouts of PT in past and chronicity of ongoing neurological and MSK issues   CLINICAL DECISION MAKING: Evolving/moderate complexity  EVALUATION COMPLEXITY: Moderate   GOALS: Goals reviewed with patient? No  SHORT TERM GOALS: Target date: 01/18/2022  Pt will be independent with HEP in order to improve strength and balance in order to decrease fall risk and improve function at home and work. Baseline: NT  Goal status: IN  PROGRESS  2.  Pt will decrease 5TSTS by at least 3 seconds in order to demonstrate clinically significant improvement in LE strength. Baseline: 23 sec   Goal status: IN PROGRESS    LONG TERM GOALS: Target date: 03/15/2022   Patient will have improved function and activity level as evidenced by an increase in FOTO score by 10 points or more.  Baseline: 9/25 FOTO: 34/100  Goal status: IN PROGRESS  2. Pt will improve BERG by at least 3 points in order to demonstrate clinically significant improvement in balance.  Baseline: 9/25: 44/56 Goal status: IN PROGRESS   3.  Pt will decrease TUG to below 14 seconds in order to demonstrate decreased fall risk. Baseline: 9/25: 25.21 sec Goal status: IN PROGRESS   4.  Patient will be able to perform tandem stance on RLE and LLE to show improved static balance and decreased risk of falling.  Baseline: Romberg  Goal status: IN PROGRESS     PLAN: PT FREQUENCY: 1-2x/week  PT DURATION: 10 weeks  PLANNED INTERVENTIONS: Therapeutic exercises, Therapeutic activity, Neuromuscular re-education, Balance training, Gait training, Patient/Family education, Self Care, Joint mobilization, Stair training, Vestibular training, DME instructions, Aquatic Therapy, Dry Needling, Electrical stimulation, Spinal manipulation, Spinal mobilization, Cryotherapy, Moist heat, Manual therapy, and Re-evaluation  PLAN FOR NEXT SESSION: Reassess goals and take FOTO. Continue to progress hip strengthening exercises in non-pain provoking positions. Do reduced reps 5 instead of 10 and less sets and gauge pain response to activity. Attempt balance activity with pain permitting.  See physiopedia for article on CRPS: [email or web address]_[email or web address])  Ellin Goodie PT, DPT  Physical Therapist- [facility]  01/04/22, 1:21 PM

## 2022-01-18 ENCOUNTER — Ambulatory Visit: Payer: Medicare Other | Admitting: Physical Therapy

## 2022-01-25 ENCOUNTER — Encounter: Payer: Self-pay | Admitting: Physical Therapy

## 2022-01-25 ENCOUNTER — Ambulatory Visit: Payer: Medicare Other | Attending: Internal Medicine | Admitting: Physical Therapy

## 2022-01-25 DIAGNOSIS — R296 Repeated falls: Secondary | ICD-10-CM | POA: Insufficient documentation

## 2022-01-25 DIAGNOSIS — R262 Difficulty in walking, not elsewhere classified: Secondary | ICD-10-CM | POA: Insufficient documentation

## 2022-01-25 DIAGNOSIS — M6281 Muscle weakness (generalized): Secondary | ICD-10-CM | POA: Insufficient documentation

## 2022-01-25 NOTE — Therapy (Addendum)
OUTPATIENT PHYSICAL THERAPY TREATMENT NOTE/ Re-certification    Dates of reporting: 12/03/21-02/02/22   Patient Name: Katie Neal MRN: [id number] DOB:Jun 28, 1970, 52 y.o., female Today's Date: 01/25/2022   PT End of Session - 01/25/22 1423     Visit Number 9    Number of Visits 20    Date for PT Re-Evaluation 02/02/22    Authorization Type Medicare 2024    Authorization Time Period 12/03/21-02/02/22    Authorization - Number of Visits 10    Progress Note Due on Visit 10    Equipment Utilized During Treatment Gait belt    Activity Tolerance Patient limited by pain    Behavior During Therapy WFL for tasks assessed/performed              Past Medical History:  Diagnosis Date   Arthritis    Asthma    Cardiomegaly    Cerebral atrophy ([place])    Complex regional pain syndrome type 1    COPD (chronic obstructive pulmonary disease) (HCC)    CRPS (complex regional pain syndrome type I)    Depression    Diaphragmatic hernia    Dyspnea    Excessive falling    Headache    History of chronic sinusitis    History of kidney stones    Hyperlipidemia    Hypothyroidism    IBS (irritable bowel syndrome)    Insomnia    Memory difficulty    Osteoarthritis of knee    Pneumonia    Post concussion syndrome    PTSD (post-traumatic stress disorder)    Seizure-like activity ([place])    Sleep apnea    not on Cpap machine but is on oxygen    Urinary incontinence    Vasovagal syncope    Past Surgical History:  Procedure Laterality Date   ABDOMINAL HYSTERECTOMY     ACHILLES TENDON REPAIR  1982   APPENDECTOMY     CYSTOSCOPY WITH STENT PLACEMENT Right 06/27/2019   Procedure: CYSTOSCOPY WITH STENT PLACEMENT;  Surgeon: Lucas Mallow, MD;  Location: [facility] ORS;  Service: Urology;  Laterality: Right;   CYSTOSCOPY/RETROGRADE/URETEROSCOPY  07/14/2019   Procedure: CYSTOSCOPY/RETROGRADE/URETEROSCOPY;  Surgeon: Abbie Sons, MD;  Location: [facility];  Service: Urology;;    CYSTOSCOPY/URETEROSCOPY/HOLMIUM LASER/STENT PLACEMENT Right 07/14/2019   Procedure: CYSTOSCOPY/URETEROSCOPY/HOLMIUM LASER/STENT Exchange;  Surgeon: Abbie Sons, MD;  Location: [facility];  Service: Urology;  Laterality: Right;   ESOPHAGOGASTRODUODENOSCOPY N/A 10/21/2020   Procedure: ESOPHAGOGASTRODUODENOSCOPY (EGD);  Surgeon: Lesly Rubenstein, MD;  Location[facility] ENDOSCOPY;  Service: Endoscopy;  Laterality: N/A;   FOOT SURGERY  2002   due to deformity   HIP SURGERY  2012   KNEE ARTHROSCOPY     LAPAROSCOPIC HYSTERECTOMY  2000   REPAIR TENDONS LEG     x2   Patient Active Problem List   Diagnosis Date Noted   Elevated troponin 09/27/2019   IBS (irritable bowel syndrome) 08/07/2019   Fever    Right lower quadrant abdominal pain    Right ureteral stone 06/27/2019   Sepsis ([place]) 06/27/2019   Acute pyelonephritis 06/27/2019   Weakness    Seizure ([place]) 12/30/2018   CRPS (complex regional pain syndrome type I)    HLD (hyperlipidemia)    Lower urinary tract infectious disease    Fall    Hypothyroidism    Headache disorder 05/06/2018   Dizziness 01/30/2018   Post concussion syndrome 01/30/2018   Vasovagal syncope 11/27/2017   Seizure-like activity ([place]) 11/27/2017   Concussion with no loss of  consciousness, initial encounter 10/23/2017   Excessive falling 10/23/2017   Urinary incontinence 10/23/2017   Chronic obstructive pulmonary disease ([place]) 09/09/2017   Obesity (BMI 35.0-39.9 without comorbidity) 06/07/2017   Transient neurological symptoms 05/24/2017   History of chronic sinusitis 12/21/2016   Abnormal PFTs 12/06/2015   Adjustment disorder with depressed mood 06/17/2015   Acute urinary retention 04/05/2015   Gross hematuria 04/05/2015   Acute non-recurrent maxillary sinusitis 10/20/2014   History of environmental allergies 07/08/2014   On home oxygen therapy 07/07/2014   Gastroparesis 06/28/2014   Pharyngoesophageal dysphagia 06/01/2014   ANA positive 05/11/2014    Anemia 05/11/2014   Shortness of breath 04/25/2014   Hypoxia 04/16/2014   Hiatal hernia 04/16/2014   Former tobacco use 04/16/2014   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   Hypoxemia requiring supplemental oxygen 03/22/2014   Globus sensation 03/20/2014   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   Midepigastric pain 03/20/2014   Pneumonia due to organism 03/19/2014   Tachycardia 03/02/2014   POTS (postural orthostatic tachycardia syndrome) 03/02/2014   Pain in joint, forearm 12/07/2013   Bilateral hand pain 10/28/2013   Insomnia 09/18/2013   High risk medication use 05/27/2013   Neurogenic pain of left foot 05/07/2013   Depression 04/08/2013   Major depressive disorder, recurrent episode, moderate ([place]) 04/08/2013   Closed fracture of metatarsal bone 02/16/2013   Low back pain 02/10/2013   OA (osteoarthritis) of knee 08/27/2012   Osteopenia 06/20/2012   H/O arthroplasty 09/20/2011   ACL graft tear ([place]) 08/02/2011   Sprain of medial collateral ligament of knee 08/02/2011    PCP: Dr. Alice Reichert   REFERRING PROVIDER: Dr. Harrel Lemon   REFERRING DIAG: Ataxia   THERAPY DIAG:  No diagnosis found.  Rationale for Evaluation and Treatment Rehabilitation  ONSET DATE: 01/2013   SUBJECTIVE:   SUBJECTIVE STATEMENT: Pt reports increased NPS of 8/10 due to holiday stress especially in left ankle and right IT band. She feels like right IT band feels tight as well.   PERTINENT HISTORY:  Patient has an extensive medical history which she believes is contributing to her current falls. The main reason she believes she is having fall is because of chronic regional pain syndrome, which she was orginally diagnosed with in 2015. She is in a constant state of pain especially in LEs . She has h/o of partial knee replacement on left knee as well as multiple ACL tears on left knee. She also feels dizzy all the time which she attributes to her low blood oxygen levels and low  blood pressure.   PAIN:  Are you having pain? 8/10 in bilateral LE ankles and feet    PRECAUTIONS: Fall  WEIGHT BEARING RESTRICTIONS No  FALLS:  Has patient fallen in last 6 months? Yes. Number of falls 8-9  LIVING ENVIRONMENT: Lives with: lives with their son Lives in: House/apartment Stairs: No Has following equipment at home:  Upright walker and SPC and [place] crutches and Manual WC    OCCUPATION: Disabled   PLOF: Needs assistance with ADLs  PATIENT GOALS To get a wheelchair evaluation for an electric WC. Increase ability to sit and stand longer and stop falling.    OBJECTIVE:   VITALS: BP 115/75 HR 96 SpO2 95   DIAGNOSTIC FINDINGS:   CLINICAL DATA:  Seizure.  Projectile vomiting.   EXAM: CT HEAD WITHOUT CONTRAST   TECHNIQUE: Contiguous axial images were obtained from the base of the skull through the vertex without intravenous contrast.   COMPARISON:  12/30/2018   FINDINGS: Brain: Age advanced cerebral atrophy again identified. No mass lesion, hemorrhage, hydrocephalus, acute infarct, intra-axial, or extra-axial fluid collection.   Vascular: No hyperdense vessel or unexpected calcification.   Skull: No significant soft tissue swelling.  No skull fracture.   Sinuses/Orbits: Normal imaged portions of the orbits and globes. Mucosal thickening of ethmoid air cells.   Other: None.   IMPRESSION: 1. No acute intracranial abnormality. 2. Age advanced cerebral atrophy. 3. Sinus disease.  PATIENT SURVEYS:  FOTO 34/100; Goal 40  COGNITION: Overall cognitive status: Within functional limits for tasks assessed     SENSATION: Light touch: Impaired Numbness and tingling around entire leg   EDEMA: Not evidenced    MUSCLE LENGTH: Not evidenced   POSTURE: rounded shoulders and forward head  PALPATION: Not performed   LOWER EXTREMITY ROM:  WFL   LOWER EXTREMITY MMT:  MMT Right eval Left eval  Hip flexion 3+ 3+   Hip extension    Hip  abduction 3 3+  Hip adduction 3 3  Hip internal rotation    Hip external rotation    Knee flexion 3+ 3+  Knee extension 3+ 3+  Ankle dorsiflexion    Ankle plantarflexion    Ankle inversion    Ankle eversion     (Blank rows = not tested)   FUNCTIONAL TESTS:  5 times sit to stand: 26 sec  30 seconds chair stand test: 6 reps  Berg Balance Scale: NT   STEADI 4-Stage Balance Test: (inability to maintain tandem stance for 10 sec = increased risk for falls) - feet together, eyes open, noncompliant surface: 10 sec - semi-tandem stance, eyes open, noncompliant surface: 3/3 sec - tandem stances, eyes open, noncompliant surface: NT  - single leg stance:         - R LE: NT          - L LE: NT   GAIT: Distance walked: 23 ft  Assistive device utilized: None Level of assistance: SBA Comments: Wide base of support and shuffling gait    TODAY'S TREATMENT:  01/25/22:  TUG: 37 sec  BERG:  -Pick up object - Needs supervision (-1) -Reaching forward- able to reach 6 inches (-1) -Turns slowly for 360 degrees (-2)  -Able to stand independently and complete 8 steps in >20 sec (-1) -Able to take small step and hold 30 sec(-2)  -Standing on 1 leg unable to hold 3 sec (-3)  Hip Adductor Squeezes 2 x 10 with 5 sec squeezes   01/11/22: Lower trunk rotation 3 x 10  Supine Bridges 3 x 5  Supine Marches 3 x 10   Side Lying Hip Abduction with right 1 x 60 sec  -Pain is too much for pt to complete   Supine IT band stretch to left 5 x 10 sec  Supine Hip Abduction 3 x 5   01/04/22: Nu-Step with seat and arms 8 with resistance at 1 for 5 min   Seated exercise: 3x10/side  Marches  Heel to toe raises  LAQ's  Clam shells with YTB at distal femurs  Hook lying exercise:   LTR's: x20/side   Hamstring curls with foot on furniture slider: x20/LE     PATIENT EDUCATION:  Education details: form and technique for appropriate exercise and explanation about tests and measures  Person  educated: Patient Education method: Customer service manager Education comprehension: verbalized understanding and returned demonstration   HOME EXERCISE PROGRAM:  Access Code: [email or web address] URL: [email or web address] Date: 01/11/2022 Prepared by:  Bradly Chris  Exercises - Supine Lower Trunk Rotation  - 1 x daily - 3 x weekly - 3 sets - 10 reps - Supine Bridge  - 1 x daily - 3 x weekly - 3 sets - 5 reps - Active Straight Leg Raise with Quad Set  - 1 x daily - 3 x weekly - 3 sets - 5 reps - Clamshell  - 3 x weekly - 3 sets - 5 reps - Supine ITB Stretch  - 1 x daily - 3 reps - 30 sec  hold - Supine Hip Abduction  - 3 x weekly - 3 sets - 5 reps   ASSESSMENT:  CLINICAL IMPRESSION:  Pt shows limited improvement with balance and she still presents at a risk for falls. This is likely due to ongoing pain flares that limit her ability to participate in PT and engage in physical activity. She will continue to benefit from skilled PT to improve her LE strength and static and dynamic balance to reduce her risk of falling.   OBJECTIVE IMPAIRMENTS Abnormal gait, decreased activity tolerance, decreased balance, decreased mobility, difficulty walking, decreased strength, dizziness, impaired sensation, and pain.   ACTIVITY LIMITATIONS carrying, lifting, bending, sitting, standing, squatting, sleeping, stairs, transfers, bed mobility, dressing, and hygiene/grooming  PARTICIPATION LIMITATIONS: meal prep, cleaning, laundry, driving, shopping, community activity, and yard work  PERSONAL FACTORS Age, Fitness, Past/current experiences, Time since onset of injury/illness/exacerbation, and 3+ comorbidities: Complex Regional Pain Syndrome, Depression, Seizure like Activity, COPD   are also affecting patient's functional outcome.   REHAB POTENTIAL: Fair Multiple bouts of PT in past and chronicity of ongoing neurological and MSK issues   CLINICAL DECISION MAKING: Evolving/moderate  complexity  EVALUATION COMPLEXITY: Moderate   GOALS: Goals reviewed with patient? No  SHORT TERM GOALS: Target date: 02/08/2022  Pt will be independent with HEP in order to improve strength and balance in order to decrease fall risk and improve function at home and work. Baseline: NT  Goal status: IN PROGRESS  2.  Pt will decrease 5TSTS by at least 3 seconds in order to demonstrate clinically significant improvement in LE strength. Baseline: 23 sec   Goal status: IN PROGRESS    LONG TERM GOALS: Target date: 04/05/2022   Patient will have improved function and activity level as evidenced by an increase in FOTO score by 10 points or more.  Baseline: 9/25 FOTO: 34/100   01/25/22: 39/100 with target of 40  Goal status: IN PROGRESS  2. Pt will improve BERG by at least 3 points in order to demonstrate clinically significant improvement in balance.  Baseline: 9/25: 44/56  01/25/22: 46 pt s  Goal status: IN PROGRESS   3.  Pt will decrease TUG to below 14 seconds in order to demonstrate decreased fall risk. Baseline: 9/25: 25.21 sec  01/25/22: 37 sec  Goal status: IN PROGRESS   4.  Patient will be able to perform tandem stance on RLE and LLE to show improved static balance and decreased risk of falling.  Baseline: [place]  Goal status: IN PROGRESS     PLAN: PT FREQUENCY: 1-2x/week  PT DURATION: 10 weeks  PLANNED INTERVENTIONS: Therapeutic exercises, Therapeutic activity, Neuromuscular re-education, Balance training, Gait training, Patient/Family education, Self Care, Joint mobilization, Stair training, Vestibular training, DME instructions, Aquatic Therapy, Dry Needling, Electrical stimulation, Spinal manipulation, Spinal mobilization, Cryotherapy, Moist heat, Manual therapy, and Re-evaluation  PLAN FOR NEXT SESSION: Address quad strengthening and work more on static and dynamic balance. Finish 5 x STS  Do reduced reps 5 instead of 10 and less sets and gauge pain response to  activity. Attempt balance activity with pain permitting.  See physiopedia for article on CRPS: [email or web address]-[email or web address]/Complex_[email or web address]_[email or web address]_Syndrome_(CRPS)  Bradly Chris PT, DPT  Physical Therapist- [facility]  01/25/22, 2:54 PM

## 2022-01-31 ENCOUNTER — Ambulatory Visit: Payer: Medicare Other | Admitting: Physical Therapy

## 2022-02-07 ENCOUNTER — Ambulatory Visit: Payer: Medicare Other | Admitting: Physical Therapy

## 2022-02-21 ENCOUNTER — Encounter: Payer: Self-pay | Admitting: Physical Therapy

## 2022-02-21 ENCOUNTER — Ambulatory Visit: Payer: Medicare Other | Admitting: Physical Therapy

## 2022-02-21 DIAGNOSIS — R262 Difficulty in walking, not elsewhere classified: Secondary | ICD-10-CM | POA: Diagnosis not present

## 2022-02-21 DIAGNOSIS — R296 Repeated falls: Secondary | ICD-10-CM

## 2022-02-21 DIAGNOSIS — M6281 Muscle weakness (generalized): Secondary | ICD-10-CM

## 2022-02-21 NOTE — Therapy (Unsigned)
OUTPATIENT PHYSICAL THERAPY PROGRESS NOTE   Patient Name: Katie Neal MRN: [id number] DOB:01-Mar-1970, 52 y.o., female Today's Date: 02/22/2022   PT End of Session - 02/21/22 1806     Visit Number 10    Number of Visits 20    Date for PT Re-Evaluation 02/02/22    Authorization Type Medicare 2024    Authorization Time Period 12/03/21-02/02/22    Authorization - Visit Number 10    Authorization - Number of Visits 10    Progress Note Due on Visit 63    PT Start Time 1800    PT Stop Time 1845    PT Time Calculation (min) 45 min    Equipment Utilized During Treatment Gait belt    Activity Tolerance Patient limited by pain    Behavior During Therapy WFL for tasks assessed/performed              Past Medical History:  Diagnosis Date   Arthritis    Asthma    Cardiomegaly    Cerebral atrophy ([place])    Complex regional pain syndrome type 1    COPD (chronic obstructive pulmonary disease) (HCC)    CRPS (complex regional pain syndrome type I)    Depression    Diaphragmatic hernia    Dyspnea    Excessive falling    Headache    History of chronic sinusitis    History of kidney stones    Hyperlipidemia    Hypothyroidism    IBS (irritable bowel syndrome)    Insomnia    Memory difficulty    Osteoarthritis of knee    Pneumonia    Post concussion syndrome    PTSD (post-traumatic stress disorder)    Seizure-like activity ([place])    Sleep apnea    not on Cpap machine but is on oxygen    Urinary incontinence    Vasovagal syncope    Past Surgical History:  Procedure Laterality Date   ABDOMINAL HYSTERECTOMY     ACHILLES TENDON REPAIR  1982   APPENDECTOMY     CYSTOSCOPY WITH STENT PLACEMENT Right 06/27/2019   Procedure: CYSTOSCOPY WITH STENT PLACEMENT;  Surgeon: Lucas Mallow, MD;  Location: [facility];  Service: Urology;  Laterality: Right;   CYSTOSCOPY/RETROGRADE/URETEROSCOPY  07/14/2019   Procedure: CYSTOSCOPY/RETROGRADE/URETEROSCOPY;  Surgeon: Abbie Sons, MD;   Location: [facility];  Service: Urology;;   CYSTOSCOPY/URETEROSCOPY/HOLMIUM LASER/STENT PLACEMENT Right 07/14/2019   Procedure: CYSTOSCOPY/URETEROSCOPY/HOLMIUM LASER/STENT Exchange;  Surgeon: Abbie Sons, MD;  Location: [facility];  Service: Urology;  Laterality: Right;   ESOPHAGOGASTRODUODENOSCOPY N/A 10/21/2020   Procedure: ESOPHAGOGASTRODUODENOSCOPY (EGD);  Surgeon: Lesly Rubenstein, MD;  Location: [facility] END[facility]COPY;  Service: Endoscopy;  Laterality: N/A;   FOOT SURGERY  2002   due to deformity   HIP SURGERY  2012   KNEE ARTHROSCOPY     LAPAROSCOPIC HYSTERECTOMY  2000   REPAIR TENDONS LEG     x2   Patient Active Problem List   Diagnosis Date Noted   Elevated troponin 09/27/2019   IBS (irritable bowel syndrome) 08/07/2019   Fever    Right lower quadrant abdominal pain    Right ureteral stone 06/27/2019   Sepsis ([place]) 06/27/2019   Acute pyelonephritis 06/27/2019   Weakness    Seizure ([place]) 12/30/2018   CRPS (complex regional pain syndrome type I)    HLD (hyperlipidemia)    Lower urinary tract infectious disease    Fall    Hypothyroidism    Headache disorder 05/06/2018   Dizziness 01/30/2018  Post concussion syndrome 01/30/2018   Vasovagal syncope 11/27/2017   Seizure-like activity (HCC) 11/27/2017   Concussion with no loss of consciousness, initial encounter 10/23/2017   Excessive falling 10/23/2017   Urinary incontinence 10/23/2017   Chronic obstructive pulmonary disease (HCC) 09/09/2017   Obesity (BMI 35.0-39.9 without comorbidity) 06/07/2017   Transient neurological symptoms 05/24/2017   History of chronic sinusitis 12/21/2016   Abnormal PFTs 12/06/2015   Adjustment disorder with depressed mood 06/17/2015   Acute urinary retention 04/05/2015   Gross hematuria 04/05/2015   Acute non-recurrent maxillary sinusitis 10/20/2014   History of environmental allergies 07/08/2014   On home oxygen therapy 07/07/2014   Gastroparesis 06/28/2014   Pharyngoesophageal dysphagia  06/01/2014   ANA positive 05/11/2014   Anemia 05/11/2014   Shortness of breath 04/25/2014   Hypoxia 04/16/2014   Hiatal hernia 04/16/2014   Former tobacco use 04/16/2014   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   Hypoxemia requiring supplemental oxygen 03/22/2014   Globus sensation 03/20/2014   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   Midepigastric pain 03/20/2014   Pneumonia due to organism 03/19/2014   Tachycardia 03/02/2014   POTS (postural orthostatic tachycardia syndrome) 03/02/2014   Pain in joint, forearm 12/07/2013   Bilateral hand pain 10/28/2013   Insomnia 09/18/2013   High risk medication use 05/27/2013   Neurogenic pain of left foot 05/07/2013   Depression 04/08/2013   Major depressive disorder, recurrent episode, moderate (HCC) 04/08/2013   Closed fracture of metatarsal bone 02/16/2013   Low back pain 02/10/2013   OA (osteoarthritis) of knee 08/27/2012   Osteopenia 06/20/2012   H/O arthroplasty 09/20/2011   ACL graft tear (HCC) 08/02/2011   Sprain of medial collateral ligament of knee 08/02/2011    PCP: Dr. Steve Rattler   REFERRING PROVIDER: Dr. Marcelino Duster   REFERRING DIAG: Ataxia   THERAPY DIAG:  Difficulty in walking, not elsewhere classified  Repeated falls  Muscle weakness (generalized)  Rationale for Evaluation and Treatment Rehabilitation  ONSET DATE: 01/2013   SUBJECTIVE:   SUBJECTIVE STATEMENT: Pt states that her pain has been down the past couple of weeks with exception of her feet which felt more pain after having cold water on them from shower she took earlier.   PERTINENT HISTORY:  Patient has an extensive medical history which she believes is contributing to her current falls. The main reason she believes she is having fall is because of chronic regional pain syndrome, which she was orginally diagnosed with in 2015. She is in a constant state of pain especially in LEs . She has h/o of partial knee replacement on left  knee as well as multiple ACL tears on left knee. She also feels dizzy all the time which she attributes to her low blood oxygen levels and low blood pressure.   PAIN:  Are you having pain? 6/10 in bilateral feet and knees and left hand and wrist  PRECAUTIONS: Fall  WEIGHT BEARING RESTRICTIONS No  FALLS:  Has patient fallen in last 6 months? Yes. Number of falls 8-9  LIVING ENVIRONMENT: Lives with: lives with their son Lives in: House/apartment Stairs: No Has following equipment at home:  Upright walker and SPC and [place] crutches and Manual WC    OCCUPATION: Disabled   PLOF: Needs assistance with ADLs  PATIENT GOALS To get a wheelchair evaluation for an electric WC. Increase ability to sit and stand longer and stop falling.    OBJECTIVE:   VITALS: BP 115/75 HR 96 SpO2 95   DIAGNOSTIC FINDINGS:  CLINICAL DATA:  Seizure.  Projectile vomiting.   EXAM: CT HEAD WITHOUT CONTRAST   TECHNIQUE: Contiguous axial images were obtained from the base of the skull through the vertex without intravenous contrast.   COMPARISON:  12/30/2018   FINDINGS: Brain: Age advanced cerebral atrophy again identified. No mass lesion, hemorrhage, hydrocephalus, acute infarct, intra-axial, or extra-axial fluid collection.   Vascular: No hyperdense vessel or unexpected calcification.   Skull: No significant soft tissue swelling.  No skull fracture.   Sinuses/Orbits: Normal imaged portions of the orbits and globes. Mucosal thickening of ethmoid air cells.   Other: None.   IMPRESSION: 1. No acute intracranial abnormality. 2. Age advanced cerebral atrophy. 3. Sinus disease.  PATIENT SURVEYS:  FOTO 34/100; Goal 40  COGNITION: Overall cognitive status: Within functional limits for tasks assessed     SENSATION: Light touch: Impaired Numbness and tingling around entire leg   EDEMA: Not evidenced    MUSCLE LENGTH: Not evidenced   POSTURE: rounded shoulders and forward  head  PALPATION: Not performed   LOWER EXTREMITY ROM:  WFL   LOWER EXTREMITY MMT:  MMT Right eval Left eval  Hip flexion 3+ 3+   Hip extension    Hip abduction 3 3+  Hip adduction 3 3  Hip internal rotation    Hip external rotation    Knee flexion 3+ 3+  Knee extension 3+ 3+  Ankle dorsiflexion    Ankle plantarflexion    Ankle inversion    Ankle eversion     (Blank rows = not tested)   FUNCTIONAL TESTS:  5 times sit to stand: 26 sec  30 seconds chair stand test: 6 reps  Berg Balance Scale: NT   STEADI 4-Stage Balance Test: (inability to maintain tandem stance for 10 sec = increased risk for falls) - feet together, eyes open, noncompliant surface: 10 sec - semi-tandem stance, eyes open, noncompliant surface: 3/3 sec - tandem stances, eyes open, noncompliant surface: NT  - single leg stance:         - R LE: NT          - L LE: NT   GAIT: Distance walked: 23 ft  Assistive device utilized: None Level of assistance: SBA Comments: Wide base of support and shuffling gait    TODAY'S TREATMENT:  02/21/22:  Nu-Step level 8 with legs only 5 min  Supine Straight leg raise 2 x 5  5 x STS: 26 sec  Seated long arc quads 1 x 10  Seated long arc quads with #1 AW 1 x 10  Seated Marches #1 AW 1 x 10  -Pt reports muscles feeling weaker with this exercise  Seated Hip Adduction Isometric 3 sec holds 1 x 10     01/25/22:  TUG: 37 sec  BERG:  -Pick up object - Needs supervision (-1) -Reaching forward- able to reach 6 inches (-1) -Turns slowly for 360 degrees (-2)  -Able to stand independently and complete 8 steps in >20 sec (-1) -Able to take small step and hold 30 sec(-2)  -Standing on 1 leg unable to hold 3 sec (-3)  Hip Adductor Squeezes 2 x 10 with 5 sec squeezes   01/11/22: Lower trunk rotation 3 x 10  Supine Bridges 3 x 5  Supine Marches 3 x 10   Side Lying Hip Abduction with right 1 x 60 sec  -Pain is too much for pt to complete   Supine IT band stretch  to left 5 x 10 sec  Supine  Hip Abduction 3 x 5      PATIENT EDUCATION:  Education details: form and technique for appropriate exercise and explanation about tests and measures  Person educated: Patient Education method: Medical illustrator Education comprehension: verbalized understanding and returned demonstration   HOME EXERCISE PROGRAM:  Access Code: [email or web address] URL: [email or web address] Date: 02/21/2022 Prepared by: Ellin Goodie  Exercises - Supine Lower Trunk Rotation  - 1 x daily - 3 x weekly - 3 sets - 10 reps - Supine Bridge  - 1 x daily - 3 x weekly - 3 sets - 5 reps - Active Straight Leg Raise with Quad Set  - 1 x daily - 3 x weekly - 3 sets - 5 reps - Clamshell  - 3 x weekly - 3 sets - 5 reps - Supine ITB Stretch  - 1 x daily - 3 reps - 30 sec  hold - Supine Hip Abduction  - 3 x weekly - 3 sets - 5 reps - Seated Hip Adduction Isometrics with Ball  - 3 x weekly - 3 sets - 5 reps - 3 sec  hold - Seated Long Arc Quad  - 3 x weekly - 3 sets - 5 reps - Seated March  - 3 x weekly - 3 sets - 5 reps   ASSESSMENT:  CLINICAL IMPRESSION:  Pt continues to show decreased LE strength that places her at an increased risk for falls and that has not progressed much since initial eval. Despite LE weakness, pt does demonstrate an improvement with static balance with ability to maintain semitandem. This is likely due to numerous setbacks with pt having a significant respiratory illness as well as the occasional CRPS exacerbation. She is now out of visits and she is checking with insurance about level of coverage. She will call back to schedule once she knows whether she can afford ongoing treatment after checking with insurance. Pt will continue to benefit from skilled PT to improve balance and LE strength to decrease risk of falling.     OBJECTIVE IMPAIRMENTS Abnormal gait, decreased activity tolerance, decreased balance, decreased mobility, difficulty walking,  decreased strength, dizziness, impaired sensation, and pain.   ACTIVITY LIMITATIONS carrying, lifting, bending, sitting, standing, squatting, sleeping, stairs, transfers, bed mobility, dressing, and hygiene/grooming  PARTICIPATION LIMITATIONS: meal prep, cleaning, laundry, driving, shopping, community activity, and yard work  PERSONAL FACTORS Age, Fitness, Past/current experiences, Time since onset of injury/illness/exacerbation, and 3+ comorbidities: Complex Regional Pain Syndrome, Depression, Seizure like Activity, COPD   are also affecting patient's functional outcome.   REHAB POTENTIAL: Fair Multiple bouts of PT in past and chronicity of ongoing neurological and MSK issues   CLINICAL DECISION MAKING: Evolving/moderate complexity  EVALUATION COMPLEXITY: Moderate   GOALS: Goals reviewed with patient? No  SHORT TERM GOALS: Target date: 02/24/22 Pt will be independent with HEP in order to improve strength and balance in order to decrease fall risk and improve function at home and work. Baseline: Performing independently  Goal status: IN PROGRESS  2.  Pt will decrease 5TSTS by at least 3 seconds in order to demonstrate clinically significant improvement in LE strength. Baseline: 23 sec  02/21/22: 26 sec  Goal status: IN PROGRESS    LONG TERM GOALS: Target date: 04/14/22  Patient will have improved function and activity level as evidenced by an increase in FOTO score by 10 points or more.  Baseline: 9/25 FOTO: 34/100   01/25/22: 39/100 with target of 40  Goal status: IN PROGRESS  2. Pt will improve  BERG by at least 3 points in order to demonstrate clinically significant improvement in balance.  Baseline: 9/25: 44/56  01/25/22: 46 pt s  Goal status: IN PROGRESS   3.  Pt will decrease TUG to below 14 seconds in order to demonstrate decreased fall risk. Baseline: 9/25: 25.21 sec  01/25/22: 37 sec  Goal status: IN PROGRESS   4.  Patient will be able to perform tandem stance on RLE and LLE  to show improved static balance and decreased risk of falling.  Baseline: [place] 02/21/22: Semi-tandem  Goal status: Achieved     PLAN: PT FREQUENCY: 1-2x/week  PT DURATION: 10 weeks  PLANNED INTERVENTIONS: Therapeutic exercises, Therapeutic activity, Neuromuscular re-education, Balance training, Gait training, Patient/Family education, Self Care, Joint mobilization, Stair training, Vestibular training, DME instructions, Aquatic Therapy, Dry Needling, Electrical stimulation, Spinal manipulation, Spinal mobilization, Cryotherapy, Moist heat, Manual therapy, and Re-evaluation  PLAN FOR NEXT SESSION: Continue with gentle strengthening and balance exercises in non pain provoking positions     Do reduced reps 5 instead of 10 and less sets and gauge pain response to activity. Attempt balance activity with pain permitting.  See physiopedia for article on CRPS: https://[email or web address]-pedia.com/Complex_Regional_Pain_Syndrome_(CRPS)  Bradly Chris PT, DPT  Physical Therapist- [facility]  02/22/22, 10:02 AM

## 2022-02-21 NOTE — Addendum Note (Signed)
Addended by: Daneil Dan on: 02/21/2022 06:13 PM   Modules accepted: Orders

## 2022-02-26 ENCOUNTER — Encounter: Payer: Medicare Other | Admitting: Physical Therapy

## 2022-03-05 ENCOUNTER — Encounter: Payer: Medicare Other | Admitting: Physical Therapy

## 2022-03-12 ENCOUNTER — Encounter: Payer: Medicare Other | Admitting: Physical Therapy

## 2023-02-27 NOTE — Progress Notes (Signed)
 Today the history is gathered from: 90% - patient  10% - daughter and grandson, Katie Neal, again  REFERRING PHYSICIAN: Rudolpho Norleen Lenis, MD PRIMARY CARE PHYSICIAN:  Rudolpho Norleen Lenis, MD  IMPRESSION/PLAN  Katie Neal is a 53 y.o. female presenting for evaluation of  DIZZINESS / POST CONCUSSION SYNDROME / SEIZURE LIKE ACTIVITY/ MEMORY DIFFICULTY/ HEADACHES -Ongoing. -Patient is reporting with worsening headaches secondary to sinus pressure. Ongoing difficulty with balance and dizziness. Taking Amitriptyline  25 mg at night, Gabapentin  100 mg in the morning and 300 mg at night, Emgality  monthly injections, Lamcital 100 mg in the morning and 200 mg at night, Seroquel  300 mg at night. -Referral to PT for gait and balance.  -Referral to OT for functional capacity evaluation.  -Continue Amitriptyline  25 mg at night. -Continue Gabapentin  100 mg in the morning and 300 mg at night. -Continue Emgality  monthly injectins -Continue Lamictal  100 mg in the morning and 200 mg at night -Continue Seroquel  300 mg at night -Recommend beginning Alpha Lipoic Acid 600 mg a day. Alpha-lipoic acid is a naturally occurring, biological antioxidant that might help with many chronic diseases, mostly diabetic peripheral neuropathy, but also can help other types of neuropathies as well. Usually given 600 mg/day, may take 3-5 wks before some symptomatic relief occurs. Diabetic patients may need to reduce their diabetic meds. It may exacerbate thiamine deficiency in patients with chronic malnutrition e.g. alcoholism.   Follow up with Dr. Lane in 3 months  CHIEF COMPLAIN & HISTORY OF PRESENT ILLNESS:  [name] is a 53 y.o. female presenting for evaluation of: Chief Complaint  Patient presents with  . DIZZINESS / POST CONCUSSION SYNDROME / SEIZURE LIKE ACTIVIT  . / MEMORY DIFFICULTY/ HEADACHES    DIZZINESS / POST CONCUSSION SYNDROME / SEIZURE LIKE ACTIVITY/ MEMORY DIFFICULTY/ HEADACHES Patient with recent fall at  home after knees gave out, no injuries. Imbalance is unchanged. Ongoing dizziness occurring intermittently with or without positional changes. Episodes of dizziness last from a few seconds to a few minutes at a time. Headaches have been worsening secondary to sinus pressure. Notes nausea, vomiting, photophobia, and phonophobia secondary to headaches. Nausea and vomiting occur without headaches occasionally. Unchanged floaters in vision. Taking Effexor  XR 225 mg daily per Psychiatry. Taking Amitriptyline  25 mg at night, Gabapentin  100 mg in the morning and 300 mg at night, Emgality  monthly injections, Lamcital 100 mg in the morning and 200 mg at night, Seroquel  300 mg at night. Has not heard back from PT or OT. Interested in resending referral.   DATA SUMMARY: 12/15/19 3 HOUR EEG IMPRESSION: This 3 hour intermittently monitored video EEG in the awake and asleep states is within normal limits.  12/04/19 ROUTINE EEG IMPRESSION: This routine EEG in the awake and asleep states is within normal limits.   09/27/2019  CT HEAD WO IMPRESSION:  1. No acute intracranial abnormality.  2. Age advanced cerebral atrophy.  3. Sinus disease.   05/25/2017  LONG TERM 5 DAY EEG Summary/Assessment: During the course of this hospitalization, the interictal EEG showed: a normal background. There were 2 event captured as described above without electrographic correlation. Conclusion/Plan:  Based on the above clinical and EEG data, there is a low suspicion for an epileptic origin to her events. Keppra will be discontinued and she will be discharged on her home lamotrigine  (used for mood) and gabapentin  (used for pain).   11/09/2014  MR BRAIN WO CONTRAST IMPRESSION: No acute intracranial abnormality. No etiology for seizure identified.  VISIT SUMMARIES: 06/10/20: Patient  with headaches and seizure-like activity, ongoing. Continue lamictal  100 mg in the morning and 200 mg nightly, refilled. Increase seroquel  to  100 mg nightly. After 2 weeks she can decide if she would like to increase to 150 mg nightly. Start gabapentin  100 mg twice daily for a week then increase to 200 mg twice daily for occipital neuralgia. Continue amitriptyline  25 mg nightly.  02/23/20: Patient with ongoing episodes of dizziness and lightening bolt sensation in her head and visual changes. Having some memory difficulty as well. Routine and 3 hour EEG wnl. Will schedule patient for 72 hour EEG. Increased Seroquel  to 50mg  at night for one week, then 75mg  nightly. Continue Lamictal  100 mg twice a day. Continue Effexor  XR 225 mg once daily.Continue amitriptyline  25 mg nightly. Recommend that patient write in a journal  09/23/18: Patient with dizziness episodes, improving.  Decrease Lamictal  to 100 mg in the morning and 200 mg at night. Taking Horizant  600 mg twice daily, continue this. Taking Effexor  225 mg once daily, continue this. Taking amitriptyline  25 mg nightly, continue this.  07/23/18: Patient with dizziness and headaches, ongoing. Continue taking all medications as prescribed.   05/06/18: Patient with dizziness and headaches, worse. Start taking Gabapentin  300 mg in the morning and 600 mg at night. Discontinue Horizant . Increase amitriptyline  to 25 mg nightly for sleep and headaches.   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 Patient has anxiety.  MMSE today is 24/30. Send referral to a counselor for anxiety.  Recommend that patient write in a journal about feelings for 5-10 minutes a day to help process emotions and relieve stress and anxiety. Increase Lamictal  300 mg (1.5 tabs) in the morning and 100 (half a tab) mg at night.   11/26/17: Patient with dizziness and syncopal spells new to me. Send referral to psychiatrist for counseling. Patient requests specialist in the [facility] system.  Order labs for Lamictal , CMP, vitamin D  levels today. Will perform MMSE during next visit.   MEDICATIONS Current  Outpatient Medications  Medication Sig Dispense Refill  . albuterol  (PROVENTIL ) 2.5 mg /3 mL (0.083 %) nebulizer solution Take 3 mLs (2.5 mg total) by nebulization 4 (four) times daily 120 ampule 11  . amitriptyline  (ELAVIL ) 25 MG tablet Take 1 tablet (25 mg total) by mouth at bedtime 30 tablet 2  . ascorbic acid  (VITAMIN C ORAL) Take by mouth once daily    . budesonide (PULMICORT) 0.25 mg/2 mL nebulizer solution Take 2 mLs (0.25 mg total) by nebulization once daily 120 mL 11  . dicyclomine  (BENTYL ) 20 mg tablet TAKE 1 TABLET (20 MG TOTAL) BY MOUTH 3 (THREE) TIMES DAILY BEFORE MEALS (Patient taking differently: Take 20 mg by mouth 3 (three) times daily as needed) 270 tablet 1  . docusate (COLACE) 50 MG capsule Take 100 mg by mouth at bedtime    . doxepin  (SINEQUAN ) 10 MG capsule 1 tab at night as needed for allergy / urticaria control 30 capsule 0  . fluticasone  propionate (FLONASE ) 50 mcg/actuation nasal spray SPRAY 2 SPRAYS INTO EACH NOSTRIL EVERY DAY 16 g 11  . gabapentin  (NEURONTIN ) 100 MG capsule Take 100 mg in the morning, and 300 mg at night (Patient taking differently: Take 100 mg by mouth every morning) 120 capsule 3  . gabapentin  (NEURONTIN ) 300 MG capsule Take 1 capsule (300 mg total) by mouth 3 (three) times daily for 30 days (Patient taking differently: Take 300 mg by mouth at bedtime) 90 capsule 1  . galcanezumab -gnlm 120 mg/mL  PnIj Inject 120 mg subcutaneously monthly 1 mL 6  . hydrOXYzine  (ATARAX ) 25 MG tablet TAKE 1 TABLET (25 MG TOTAL) BY MOUTH 3 (THREE) TIMES DAILY AS NEEDED FOR ANXIETY FOR UP TO 90 DAYS 270 tablet 0  . lamoTRIgine  (LAMICTAL ) 100 MG tablet TAKE 1 TABLET BY MOUTH EVERY MORNING AND TAKE 2 TABLETS EVERY EVENING 270 tablet 1  . levorphanol 2 mg tablet Take 2 mg by mouth every 6 (six) hours as needed for Pain    . LEVORPHANOL TARTRATE  ORAL Take 2 mg by mouth 3 (three) times daily    . levothyroxine  (SYNTHROID ) 137 MCG tablet TAKE 1 TABLET (137 MCG TOTAL) BY MOUTH EVERY  MORNING BEFORE BREAKFAST (0630) NEEDS FOLLOW UP APPT BEFORE FURTHER REFILLS WILL BE GIVEN. 90 tablet 1  . lidocaine  (LIDODERM ) 5 % patch 1 PATCH TO SKIN REMOVE AFTER 12 HOURS ONCE A DAY EXTERNALLY 30 DAY(S)    . linaCLOtide  (LINZESS ) 145 mcg capsule Take 1 capsule (145 mcg total) by mouth once daily (Patient taking differently: Take 145 mcg by mouth every morning) 30 capsule 6  . mepolizumab (NUCALA) 100 mg/mL auto-injection Inject 1 mL (100 mg total) subcutaneously every 28 (twenty-eight) days Allow to sit at room temperature for 30 minutes prior to administration. Administer via SUBQ injection into the upper arm, thigh, or abdomen (avoid 5 cm [~2 inches] area surrounding the navel); avoid skin that is tender, bruised, red, or hard. ** Do not freeze; protect from light and heat. Do not shake. ** 1 mL 11  . mepolizumab (NUCALA) 100 mg/mL auto-injection INJECT 1 PEN UNDER THE SKIN EVERY 28 DAYS 1 mL 11  . metoprolol  succinate (TOPROL -XL) 25 MG XL tablet TAKE 1 TABLET BY MOUTH EVERY DAY 90 tablet 1  . montelukast  (SINGULAIR ) 10 mg tablet take 1 tablet by mouth every day 90 tablet 1  . multivitamin tablet Take 1 tablet by mouth once daily    . naloxone (NARCAN) 4 mg/actuation nasal spray Place 4 mg into one nostril once    . nebulizer accessories Misc Use as directed 1 each 12  . ondansetron  (ZOFRAN -ODT) 4 MG disintegrating tablet Take 1 tablet (4 mg total) by mouth every 12 (twelve) hours as needed for Nausea for up to 260 days 30 tablet 11  . pantoprazole  (PROTONIX ) 40 MG DR tablet Take 1 tablet (40 mg total) by mouth 2 (two) times daily before meals 60 tablet 3  . PAZEO 0.7 % ophthalmic solution Place 1 drop into both eyes once daily as needed for Eye Allergies 2.5 mL 11  . QUEtiapine  (SEROQUEL ) 100 MG tablet TAKE 3 TABLETS BY MOUTH AT NIGHTLY 270 tablet 1  . simvastatin  (ZOCOR ) 10 MG tablet take 1 tablet by mouth everyday at bedtime 90 tablet 1  . SYMBICORT 80-4.5 mcg/actuation inhaler Inhale 2  inhalations into the lungs 2 (two) times daily 30.6 g 3  . tiotropium (SPIRIVA  WITH HANDIHALER) 18 mcg inhalation capsule Place 1 capsule (18 mcg total) into inhaler and inhale once daily 30 capsule 1  . tiZANidine  (ZANAFLEX ) 4 MG tablet Take 1 tablet (4 mg total) by mouth every 8 (eight) hours as needed 90 tablet 1  . traZODone  (DESYREL ) 100 MG tablet Take 2 tablets (200 mg total) by mouth at bedtime for 90 days 180 tablet 1  . venlafaxine  (EFFEXOR -XR) 75 MG XR capsule Take 3 capsules (225 mg total) by mouth once daily 270 capsule 1  . VENTOLIN  HFA 90 mcg/actuation inhaler INHALE 2 PUFFS BY MOUTH EVERY 6  HOURS AS NEEDED FOR WHEEZING 54 each 1  . gabapentin  (NEURONTIN ) 300 MG capsule Take 1 capsule (300 mg total) by mouth 3 (three) times daily Take 300 mg twice a day for two weeks then increase to 300 mg three times a day (Patient not taking: Reported on 02/27/2023) 90 capsule 2  . levocetirizine (XYZAL ) 5 MG tablet TAKE 1 TABLET BY MOUTH EVERY DAY IN THE EVENING (Patient not taking: Reported on 02/27/2023) 90 tablet 1  . mepolizumab (NUCALA) 100 mg/mL auto-injection Inject 1 mL (100 mg total) subcutaneously every 28 (twenty-eight) days Allow to sit at room temperature for 30 minutes prior to administration. Administer via SUBQ injection into the upper arm, thigh, or abdomen (avoid 5 cm [~2 inches] area surrounding the navel); avoid skin that is tender, bruised, red, or hard. ** Do not freeze; protect from light and heat. Do not shake. ** (Patient not taking: Reported on 02/27/2023) 1 mL 11  . triamcinolone 0.5 % cream Apply topically 2 (two) times daily (Patient not taking: Reported on 02/27/2023) 30 g 0   No current facility-administered medications for this visit.    ALLERGIES Allergies  Allergen Reactions  . Codeine Itching  . Codeine Sulfate Unknown     EXAM   Vitals:   02/27/23 1455  BP: 111/76  Pulse: 83  Weight: 93.4 kg (206 lb)  Height: 165.1 cm (5' 5)  PainSc:   7  PainLoc:  Generalized    Body mass index is 34.28 kg/m.   GENERAL: Pleasant female, NAD. Normocephalic and atraumatic.  MUSCULOSKELETAL: Bulk - Normal Tone - Normal Pronator Drift - Absent bilaterally. Ambulation - Gait and station are not assessed today, using wheelchair for assistance. Romberg - not tested today for safety. No significant ataxia.   NEUROLOGICAL: MENTAL STATUS: Patient is oriented to person, place and time.   Short-term memory is intact Long-term memory is intact.   Attention span and concentration are intact.   Naming and repetition are intact. Comprehension is intact.   Expressive speech is intact.   Patient's fund of knowledge is within normal limits for educational level.  CRANIAL NERVES: Visual acuity and visual fields are intact         Extraocular muscles are intact                        Facial sensation is intact bilaterally                Facial strength is intact bilaterally                   Hearing is intact bilaterally                              Palate elevates midline, normal phonation      COORDINATION/CEREBELLAR: Finger to nose testing is intact.     Decreased sensation in right arm 1+ reflexes bilateral arms  PAST MEDICAL HISTORY Past Medical History:  Diagnosis Date  . ACL tear    right knee with sprain  . Allergic rhinitis due to allergen   . Allergic state   . Anemia 2016  . Anxiety   . Asthma without status asthmaticus (HHS-HCC)    age 42  . Bipolar disorder (CMS/HHS-HCC) February 2014   Bipolar 2 disorder  . Cervical cancer (CMS/HHS-HCC)   . Chest pain 02/2014   MI ruled out at [facility]; pt saw Dr  Oneil Hibbs, her cardiologist, in March, 2016  . Chondromalacia of lateral femoral condyle   . Chondromalacia of lateral tibial plateau   . Chronic obstructive pulmonary disease (CMS/HHS-HCC) 09/09/2017  . Chronic pain syndrome   . Complete tear of MCL of knee    Right  . Complex regional pain syndrome type 1 of left lower  extremity   . Connective tissue disease (CMS/HHS-HCC)   . Cough due to bronchospasm 03/08/2015  . CRPS (complex regional pain syndrome)   . Depression 1985  . Eosinophilic asthma (HHS-HCC)   . GERD (gastroesophageal reflux disease)    gastroparsis 06/24/14  . Hiatal hernia   . Hyperlipidemia   . IBS (irritable bowel syndrome)   . IBS (irritable bowel syndrome)   . Insomnia   . Myalgia   . On home oxygen therapy 07/07/14   Patient is on 2 Liter day and night per pulmonologist  . Osteoarthritis    right knee  . Osteoporosis, post-menopausal 2014  . Pes anserinus bursitis of right knee   . Pes planus   . Pneumonia    twice most recent Feb 2016  . Post traumatic stress disorder February 2014  . Pulmonary aspiration   . Seizures (CMS/HHS-HCC)    s/p receiving an injection in her neck  . Sleep apnea   . Sleep apnea   . Spondylosis   . Spondylosis   . Spondylosis   . Systemic lupus erythematosus (CMS/HHS-HCC) ??   Test are leaning that way but offical diagnosis has not been  . Tachycardia   . Tear of PCL (posterior cruciate ligament) of knee    Right  . Thyroid  disease   . Trochanteric bursitis    right hip    PAST SURGICAL HISTORY Past Surgical History:  Procedure Laterality Date  . HYSTERECTOMY  2000  . MAKOplasty  11/18/2007  . OOPHORECTOMY  2013   for PCOS  . DESTRUCTION PERIPHERAL NERVE BY RADIOFREQUENCY Right 04/28/2013   Procedure: cooled radiofrequency lesioning right knee genicular RFL X 3  IV SEDATION;  Surgeon: Gustavo Rice Carwin, MD;  Location: [facility] OR;  Service: Anes/ Pain Mgmt;  Laterality: Right;  Bill 3 units  . INJECTION ANESTHETIC AGENT LUMBAR/THORACIC Left 06/22/2013   Procedure: LUMBAR SYMPATHETIC BLOCK     MODERATE SEDATION;  Surgeon: Arlean Earnie Heather, MD;  Location: [facility] OR;  Service: Anes/ Pain Mgmt;  Laterality: Left;  . INJECTION ANESTHETIC AGENT LUMBAR/THORACIC Left 08/24/2013   Procedure: LSB LEFT;  Surgeon: Arlean Earnie Heather, MD;  Location: [facility] OR;   Service: Anes/ Pain Mgmt;  Laterality: Left;  . SMALL INTESTINE SURGERY  2016   J-Tube  . HERNIA REPAIR  2016  . open lung biopsy  04/2014  . BRONCHOSCOPY N/A 05/07/2014   Procedure: BRONCHOSCOPY;  Surgeon: Alm Adela Pizza, MD;  Location: [facility] OR;  Service: Cardiothoracic;  Laterality: N/A;  . LAPAROSCOPIC ILEOSTOMY/JEJUNOSTOMY N/A 05/20/2014   Procedure: LAPAROSCOPIC JEJUNOSTOMY  ;  Surgeon: Clem Glenford Chihuahua, MD;  Location: [facility] OR;  Service: Cardiothoracic;  Laterality: N/A;  . ESOPHAGEAL MANOMETRY N/A 06/14/2014   Procedure: ESOPHAGEAL MANOMETRY;  Surgeon: Clem Glenford Chihuahua, MD;  Location: [facility] ENDO/BRONCH;  Service: Gastroenterology;  Laterality: N/A;  . 24 HR PH PROBE N/A 06/14/2014   Procedure: 24 HR PH PROBE - off PPI;  Surgeon: Clem Glenford Chihuahua, MD;  Location[facility] ENDO/BRONCH;  Service: Gastroenterology;  Laterality: N/A;  . EGD N/A 09/22/2014   Procedure: ESOPHAGOGASTRODUODENOSCOPY, FLEXIBLE, TRANSORAL; DIAGNOSTIC, INCLUDING COLLECTION OF SPECIMEN(S) BY BRUSHING OR  WASHING, WHEN PERFORMED (SEPARATE PROCEDURE);  Surgeon: Clem Glenford Chihuahua, MD;  Location: [facility] OR;  Service: Cardiothoracic;  Laterality: N/A;  . REPAIR HIATAL HERNIA  09/22/2014  . EGD  10/21/2020   Large amount of food in the stomach/Otherwise normal EGD/Repeat as needed/CTL  . APPENDECTOMY    . ARTHROSCOPIC REPAIR ACL Right   . BRONCHOSCOPY    . COMPLEX REPAIR FOOT Left   . EXCISION TROCHANTERIC BURSA/CALCIFICATION Right   . JOINT REPLACEMENT  ??   Partial Knee - Maco replacement  . kidney stone    . LATERAL RETINACULAR RELEASE Right   . OOPHORECTOMY    . OTHER SURGERY    . REPAIR ACHILLES TENDON W/GRAFT Left   . REPLACEMENT UNICONDYLAR JOINT KNEE Right    medial    FAMILY HISTORY Family History  Problem Relation Name Age of Onset  . Allergic rhinitis Mother Vickie Abbott   . Alcohol abuse Mother Vickie Abbott   . Allergies Mother Vickie Abbott   . Substance Abuse Mother Vickie  Abbott   . Arthritis Mother Boby Arrant   . Diabetes type II Father Katherene Somerset   . Obesity Father Ashlie Mcmenamy   . High blood pressure (Hypertension) Father Katherene Somerset   . Allergies Father Shamel Galyean   . Arthritis Father Jenyfer Trawick   . Psoriasis Father Aidyn Sportsman   . Gout Father Deshauna Cayson   . Kidney cancer Maternal Grandmother Amy Jensen   . Alcohol abuse Maternal Grandmother Amy Jensen   . Cancer Maternal Grandmother Amy Jensen        Kidney  . Breast cancer Maternal Grandmother Amy Jensen   . Arthritis Maternal Grandmother Amy Jensen   . Alzheimer's disease Maternal Grandfather    . Heart failure Maternal Grandfather    . Stroke Maternal Grandfather    . Heart disease Maternal Grandfather    . Arthritis Maternal Grandfather    . Lymphoma Paternal Grandfather [facility]   . Cancer Paternal Grandfather [facility]        Non-[place]  . Arthritis Paternal Grandfather [facility]   . Arthritis Paternal Grandmother    . Anxiety Brother    . Alcohol abuse Brother    . Allergies Brother    . Heart valve disease Brother    . Substance Abuse Brother Armonii Sieh   . Substance Abuse Brother [employer]   . Psoriasis Son    . Breast cancer Maternal Aunt    . Anesthesia problems Neg Hx      SOCIAL HISTORY  Social History   Tobacco Use  . Smoking status: Former    Current packs/day: 0.00    Average packs/day: 1 pack/day for 30.0 years (30.0 ttl pk-yrs)    Types: Cigarettes    Start date: 11/22/1983    Quit date: 11/21/2013    Years since quitting: 9.2  . Smokeless tobacco: Never  Vaping Use  . Vaping status: Former  Substance Use Topics  . Alcohol use: Not Currently    Comment: Occasional-one to two times/year  . Drug use: No     REVIEW OF SYSTEMS:  13 system ROS form was given to the patient to complete and I have reviewed it.  The form was sent for scan to the patient's EHR.  Pertinent positives and negatives are mentioned above in the HPI and all other systems are  negative.   DATA  I have personally reviewed all of the data outlined below both prior to the appointment and  during the appointment with the patient as appropriate.  No visits with results within 6 Month(s) from this visit.  Latest known visit with results is:  Office Visit on 03/21/2022  Component Date Value Ref Range Status  . Influenza A PCR 03/21/2022 Negative  Negative Final  . Influenza B PCR 03/21/2022 Negative  Negative Final  . RSV PCR 03/21/2022 Negative  Negative Final  . SARS-CoV2 PCR 03/21/2022 Negative  Negative Final   No follow-ups on file.  Payor: MEDICARE / Plan: MEDICARE A AND B / Product Type: Medicare /    This note is partially written by Roe Mater, in the presence of and acting as the scribe of Dr. Arthea Farrow.    I have reviewed, edited and added to the note as needed to reflect my best personal medical judgment.    Dr. Arthea Farrow, MD [facility] [place], [name] Ph:  [phone] Fax:  [phone]

## 2023-04-05 ENCOUNTER — Other Ambulatory Visit: Payer: Self-pay

## 2023-04-05 ENCOUNTER — Emergency Department
Admission: EM | Admit: 2023-04-05 | Discharge: 2023-04-05 | Disposition: A | Discharge status: Home / Self Care | Attending: Emergency Medicine | Admitting: Emergency Medicine

## 2023-04-05 ENCOUNTER — Emergency Department

## 2023-04-05 DIAGNOSIS — W19XXXA Unspecified fall, initial encounter: Secondary | ICD-10-CM

## 2023-04-05 DIAGNOSIS — W101XXA Fall (on)(from) sidewalk curb, initial encounter: Secondary | ICD-10-CM | POA: Insufficient documentation

## 2023-04-05 DIAGNOSIS — Y9301 Activity, walking, marching and hiking: Secondary | ICD-10-CM | POA: Diagnosis not present

## 2023-04-05 DIAGNOSIS — S60511A Abrasion of right hand, initial encounter: Secondary | ICD-10-CM | POA: Diagnosis not present

## 2023-04-05 DIAGNOSIS — J449 Chronic obstructive pulmonary disease, unspecified: Secondary | ICD-10-CM | POA: Insufficient documentation

## 2023-04-05 DIAGNOSIS — S0083XA Contusion of other part of head, initial encounter: Secondary | ICD-10-CM | POA: Diagnosis present

## 2023-04-05 DIAGNOSIS — S0081XA Abrasion of other part of head, initial encounter: Secondary | ICD-10-CM

## 2023-04-05 DIAGNOSIS — Y92480 Sidewalk as the place of occurrence of the external cause: Secondary | ICD-10-CM | POA: Insufficient documentation

## 2023-04-05 DIAGNOSIS — J45909 Unspecified asthma, uncomplicated: Secondary | ICD-10-CM | POA: Insufficient documentation

## 2023-04-05 DIAGNOSIS — S80211A Abrasion, right knee, initial encounter: Secondary | ICD-10-CM | POA: Insufficient documentation

## 2023-04-05 LAB — URINALYSIS, ROUTINE W REFLEX MICROSCOPIC
Bilirubin Urine: NEGATIVE
Glucose, UA: NEGATIVE mg/dL
Ketones, ur: NEGATIVE mg/dL
Nitrite: NEGATIVE
Protein, ur: NEGATIVE mg/dL
Specific Gravity, Urine: 1.016 (ref 1.005–1.030)
pH: 5 (ref 5.0–8.0)

## 2023-04-05 LAB — CBC
HCT: 35.9 % — ABNORMAL LOW (ref 36.0–46.0)
Hemoglobin: 12.1 g/dL (ref 12.0–15.0)
MCH: 30.6 pg (ref 26.0–34.0)
MCHC: 33.7 g/dL (ref 30.0–36.0)
MCV: 90.7 fL (ref 80.0–100.0)
Platelets: 206 10*3/uL (ref 150–400)
RBC: 3.96 MIL/uL (ref 3.87–5.11)
RDW: 13.5 % (ref 11.5–15.5)
WBC: 8.3 10*3/uL (ref 4.0–10.5)
nRBC: 0 % (ref 0.0–0.2)

## 2023-04-05 LAB — BASIC METABOLIC PANEL
Anion gap: 8 (ref 5–15)
BUN: 12 mg/dL (ref 6–20)
CO2: 26 mmol/L (ref 22–32)
Calcium: 8.7 mg/dL — ABNORMAL LOW (ref 8.9–10.3)
Chloride: 101 mmol/L (ref 98–111)
Creatinine, Ser: 0.9 mg/dL (ref 0.44–1.00)
GFR, Estimated: >60 mL/min (ref >60)
Glucose, Bld: 115 mg/dL — ABNORMAL HIGH (ref 70–99)
Potassium: 3.5 mmol/L (ref 3.5–5.1)
Sodium: 135 mmol/L (ref 135–145)

## 2023-04-05 MED ORDER — BACITRACIN ZINC 500 UNIT/GM EX OINT
TOPICAL_OINTMENT | Freq: Once | CUTANEOUS | Status: AC
  Administered 2023-04-05: 1 via TOPICAL
  Filled 2023-04-05: qty 1.8

## 2023-04-05 NOTE — ED Provider Notes (Signed)
 [facility] Emergency Department Provider Note     Event Date/Time   First MD Initiated Contact with Patient 04/05/23 1801     (approximate)   History   Fall   HPI  Katie Neal is a 53 y.o. female with a history of arthritis, depression, asthma, IBS, COPD, and seizure history, and ataxic gait, presents to the ED via EMS from home.  Patient apparently had a witnessed syncopal episode while with her mother.  Mom denies any seizure-like activity.  Patient apparently hit her head on the concrete and sustained a small hematoma to left side of the head.  She presents to the ED initially endorsing headache, blurry vision, and generalized body pain.  She notes contusion to the face as well as superficial abrasions to the hand and knee.  She would states she felt dizzy prior to the fall.  She denies any blood thinner use, reports has been compliant with her seizure medications.  She presents via EMS for evaluation of her symptoms.  Physical Exam   Triage Vital Signs: ED Triage Vitals [04/05/23 1558]  Encounter Vitals Group     BP 109/67     Systolic BP Percentile      Diastolic BP Percentile      Pulse Rate 70     Resp 20     Temp 98 F (36.7 C)     Temp Source Oral     SpO2 98 %     Weight      Height      Head Circumference      Peak Flow      Pain Score 6     Pain Loc      Pain Education      Exclude from Growth Chart     Most recent vital signs: Vitals:   04/05/23 1558 04/05/23 1912  BP: 109/67 (!) 93/57  Pulse: 70 74  Resp: 20 20  Temp: 98 F (36.7 C)   SpO2: 98% 98%    General Awake, no distress. NAD. A&O x 4 HEENT NCAT, except for large hematoma over the left forehead and brow as well as some left lower cheek abrasion.  Normal fundi bilaterally. PERRL. EOMI. No rhinorrhea. Mucous membranes are moist.  CV:  Good peripheral perfusion. RRR RESP:  Normal effort. CTA ABD:  No distention.  MSK:  Composite fist distally.  Superficial  abrasion noted to the right hand and right knee.  Normal active range of motion of all extremities. NEURO: Cranial nerves II to XII grossly intact.   ED Results / Procedures / Treatments   Labs (all labs ordered are listed, but only abnormal results are displayed) Labs Reviewed  BASIC METABOLIC PANEL - Abnormal; Notable for the following components:      Result Value   Glucose, Bld 115 (*)    Calcium 8.7 (*)    All other components within normal limits  CBC - Abnormal; Notable for the following components:   HCT 35.9 (*)    All other components within normal limits  URINALYSIS, ROUTINE W REFLEX MICROSCOPIC - Abnormal; Notable for the following components:   Color, Urine YELLOW (*)    APPearance HAZY (*)    Hgb urine dipstick SMALL (*)    Leukocytes,Ua SMALL (*)    Bacteria, UA FEW (*)    All other components within normal limits     EKG  Vent. rate 70 BPM PR interval 156 ms QRS duration 98 ms  QT/QTcB 454/490 ms P-R-T axes 21 31 23  Normal sinus rhythm Inferior infarct , age undetermined Anterolateral infarct , age undetermined Abnormal ECG No STEMI  RADIOLOGY  I personally viewed and evaluated these images as part of my medical decision making, as well as reviewing the written report by the radiologist.  ED Provider Interpretation: No acute findings  CT HEAD WO CONTRAST Result Date: 04/05/2023 CLINICAL DATA:  Provided history: Fall. Dizziness. Possible loss of consciousness. EXAM: CT HEAD WITHOUT CONTRAST CT CERVICAL SPINE WITHOUT CONTRAST TECHNIQUE: Multidetector CT imaging of the head and cervical spine was performed following the standard protocol without intravenous contrast. Multiplanar CT image reconstructions of the cervical spine were also generated. RADIATION DOSE REDUCTION: This exam was performed according to the departmental dose-optimization program which includes automated exposure control, adjustment of the mA and/or kV according to patient size and/or use  of iterative reconstruction technique. COMPARISON:  Head CT 09/27/2019. Cervical spine radiographs 03/20/2008. FINDINGS: CT HEAD FINDINGS Brain: There is no acute intracranial hemorrhage. No demarcated cortical infarct. No extra-axial fluid collection. No evidence of an intracranial mass. No midline shift. Partially empty sella turcica. Vascular: No hyperdense vessel. Skull: No calvarial fracture or aggressive osseous lesion. Sinuses/Orbits: No mass or acute finding within the imaged orbits. No significant paranasal sinus disease. Other: Large forehead hematoma on the left. Large right middle ear/mastoid effusion. CT CERVICAL SPINE FINDINGS Alignment: Nonspecific reversal of the expected cervical lordosis. Slight C2-C3 and C3-C4 grade 1 anterolisthesis. Skull base and vertebrae: The basion-dental and atlanto-dental intervals are maintained.No evidence of acute fracture to the cervical spine. Soft tissues and spinal canal: No prevertebral fluid or swelling. No visible canal hematoma. Disc levels: Cervical spondylosis with multilevel disc space narrowing. At C5-C6 and C6-C7, there are posterior disc osteophyte complexes with bilateral uncovertebral hypertrophy. No appreciable high-grade spinal canal stenosis. Bony neural foraminal narrowing on the left at C5-C6. Ventral osteophytes at C5-C6 and C6-C7. Upper chest: Paraseptal emphysema. 15 mm rightward projecting esophageal diverticulum arising at the C6-C7 level (for instance as seen on series 5, image 34). Possible additional 0.6 cm right rib projecting esophageal diverticulum arising at the T1 level (series 3, image 90). IMPRESSION: CT head: 1.  No evidence of an acute intracranial abnormality. 2. Partially empty sella turcica. This finding can reflect incidental anatomic variation, or alternatively, it can be associated with chronic idiopathic intracranial hypertension (pseudotumor cerebri). 3. Large forehead hematoma. 4. Large right middle ear/mastoid effusion. CT  cervical spine: 1. No evidence of an acute cervical spine fracture. 2. Nonspecific reversal of the expected cervical lordosis. 3. Slight grade 1 anterolisthesis at C2-C3 and C3-C4. 4. Cervical spondylosis as described. 5. 15 mm rightward projecting esophageal diverticulum arising at the C6-C7 level. Possible additional 0.6 cm rightward projecting esophageal diverticulum arising at the T1 level. A non-emergent esophagram should be considered for further evaluation. 6. Emphysema (ICD10-J43.9). Electronically Signed   By: Jackey Loge D.O.   On: 04/05/2023 17:19   CT Cervical Spine Wo Contrast Result Date: 04/05/2023 CLINICAL DATA:  Provided history: Fall. Dizziness. Possible loss of consciousness. EXAM: CT HEAD WITHOUT CONTRAST CT CERVICAL SPINE WITHOUT CONTRAST TECHNIQUE: Multidetector CT imaging of the head and cervical spine was performed following the standard protocol without intravenous contrast. Multiplanar CT image reconstructions of the cervical spine were also generated. RADIATION DOSE REDUCTION: This exam was performed according to the departmental dose-optimization program which includes automated exposure control, adjustment of the mA and/or kV according to patient size and/or use of iterative reconstruction technique. COMPARISON:  Head CT 09/27/2019. Cervical spine radiographs 03/20/2008. FINDINGS: CT HEAD FINDINGS Brain: There is no acute intracranial hemorrhage. No demarcated cortical infarct. No extra-axial fluid collection. No evidence of an intracranial mass. No midline shift. Partially empty sella turcica. Vascular: No hyperdense vessel. Skull: No calvarial fracture or aggressive osseous lesion. Sinuses/Orbits: No mass or acute finding within the imaged orbits. No significant paranasal sinus disease. Other: Large forehead hematoma on the left. Large right middle ear/mastoid effusion. CT CERVICAL SPINE FINDINGS Alignment: Nonspecific reversal of the expected cervical lordosis. Slight C2-C3 and  C3-C4 grade 1 anterolisthesis. Skull base and vertebrae: The basion-dental and atlanto-dental intervals are maintained.No evidence of acute fracture to the cervical spine. Soft tissues and spinal canal: No prevertebral fluid or swelling. No visible canal hematoma. Disc levels: Cervical spondylosis with multilevel disc space narrowing. At C5-C6 and C6-C7, there are posterior disc osteophyte complexes with bilateral uncovertebral hypertrophy. No appreciable high-grade spinal canal stenosis. Bony neural foraminal narrowing on the left at C5-C6. Ventral osteophytes at C5-C6 and C6-C7. Upper chest: Paraseptal emphysema. 15 mm rightward projecting esophageal diverticulum arising at the C6-C7 level (for instance as seen on series 5, image 34). Possible additional 0.6 cm right rib projecting esophageal diverticulum arising at the T1 level (series 3, image 90). IMPRESSION: CT head: 1.  No evidence of an acute intracranial abnormality. 2. Partially empty sella turcica. This finding can reflect incidental anatomic variation, or alternatively, it can be associated with chronic idiopathic intracranial hypertension (pseudotumor cerebri). 3. Large forehead hematoma. 4. Large right middle ear/mastoid effusion. CT cervical spine: 1. No evidence of an acute cervical spine fracture. 2. Nonspecific reversal of the expected cervical lordosis. 3. Slight grade 1 anterolisthesis at C2-C3 and C3-C4. 4. Cervical spondylosis as described. 5. 15 mm rightward projecting esophageal diverticulum arising at the C6-C7 level. Possible additional 0.6 cm rightward projecting esophageal diverticulum arising at the T1 level. A non-emergent esophagram should be considered for further evaluation. 6. Emphysema (ICD10-J43.9). Electronically Signed   By: Jackey Loge D.O.   On: 04/05/2023 17:19     PROCEDURES:  Critical Care performed: No  Procedures   MEDICATIONS ORDERED IN ED: Medications  bacitracin ointment (1 Application Topical Given  04/05/23 1850)     IMPRESSION / MDM / ASSESSMENT AND PLAN / ED COURSE  I reviewed the triage vital signs and the nursing notes.                              Differential diagnosis includes, but is not limited to, SDH, closed head injury, facial fracture, facial contusion, cervical fracture, cervical strain, abrasions, lacerations, contusions, myalgias  Patient's presentation is most consistent with acute presentation with potential threat to life or bodily function.  Patient's diagnosis is consistent with mechanical fall resulting in facial contusion and abrasion.  Patient would endorse at this time she feels at baseline.  She would report an ataxic gait which she uses an assistive device.  She denies any seizure activity or any ongoing weakness at this time.  Workup is reassuring with a reassuring lab and EKG without malignant arrhythmia.  CT images reviewed by me of the head and neck are negative for any acute findings.  Patient's facial abrasions are cleansed and dressed with antibiotic ointment.  Patient will be discharged home with instructions to take her home meds. Patient is to follow up with her PCP as discussed, as needed or otherwise directed. Patient is given ED precautions to return  to the ED for any worsening or new symptoms.   FINAL CLINICAL IMPRESSION(S) / ED DIAGNOSES   Final diagnoses:  Fall, initial encounter  Contusion of face, initial encounter  Facial abrasion, initial encounter     Rx / DC Orders   ED Discharge Orders     None        Note:  This document was prepared using Dragon voice recognition software and may include unintentional dictation errors.    Lissa Hoard, PA-C 04/06/23 0032    Chesley Noon, MD 04/09/23 [id number]

## 2023-04-05 NOTE — ED Triage Notes (Signed)
 Pt to ED via POV from home. Pt had witnessed syncopal episode by mother. No seizure like activity noted per mother. Mom reports pt hit head on concrete. Pt had hematoma to left side of head. Pt reports HA, blurry vision and all over body pain. Pt also states dizziness prior to fall. Pt denies blood thinner. Pt reports hx of seizures and has been med compliant.

## 2023-04-05 NOTE — ED Notes (Addendum)
 First nurse note: Pt here via [facility] from the sidewalk annd was walking to eat dinner. Pt states she was dizzy and what caused fell. EMS unsure of blood thinner. Possible LOC.   Hematoma and Lac to L side of forehead.   VSS per EMS

## 2023-04-05 NOTE — Discharge Instructions (Addendum)
 Your exam, labs, EKG, and CT scans are normal and reassuring at this time.  No signs of a serious injury related to your fall.  Keep the wounds clean, dry, and cover with a thin veil of antibiotic ointment to help heal.  You likely experience ongoing swelling and bruising and heaviness to the left eyelid and face due to the hematoma.  Apply ice to help reduce swelling.  Follow-up with your primary provider or return to the ED if needed.

## 2023-04-10 ENCOUNTER — Other Ambulatory Visit: Payer: Self-pay

## 2023-04-10 ENCOUNTER — Emergency Department
Admission: EM | Admit: 2023-04-10 | Discharge: 2023-04-10 | Disposition: A | Discharge status: Home / Self Care | Attending: Emergency Medicine | Admitting: Emergency Medicine

## 2023-04-10 DIAGNOSIS — W19XXXA Unspecified fall, initial encounter: Secondary | ICD-10-CM | POA: Diagnosis not present

## 2023-04-10 DIAGNOSIS — S0512XA Contusion of eyeball and orbital tissues, left eye, initial encounter: Secondary | ICD-10-CM | POA: Diagnosis not present

## 2023-04-10 DIAGNOSIS — R58 Hemorrhage, not elsewhere classified: Secondary | ICD-10-CM

## 2023-04-10 DIAGNOSIS — S0511XA Contusion of eyeball and orbital tissues, right eye, initial encounter: Secondary | ICD-10-CM | POA: Insufficient documentation

## 2023-04-10 DIAGNOSIS — S0591XA Unspecified injury of right eye and orbit, initial encounter: Secondary | ICD-10-CM | POA: Diagnosis present

## 2023-04-10 DIAGNOSIS — R42 Dizziness and giddiness: Secondary | ICD-10-CM | POA: Insufficient documentation

## 2023-04-10 LAB — BASIC METABOLIC PANEL
Anion gap: 8 (ref 5–15)
BUN: 6 mg/dL (ref 6–20)
CO2: 28 mmol/L (ref 22–32)
Calcium: 8.7 mg/dL — ABNORMAL LOW (ref 8.9–10.3)
Chloride: 101 mmol/L (ref 98–111)
Creatinine, Ser: 0.8 mg/dL (ref 0.44–1.00)
GFR, Estimated: >60 mL/min (ref >60)
Glucose, Bld: 108 mg/dL — ABNORMAL HIGH (ref 70–99)
Potassium: 3.4 mmol/L — ABNORMAL LOW (ref 3.5–5.1)
Sodium: 137 mmol/L (ref 135–145)

## 2023-04-10 LAB — CBC
HCT: 40.4 % (ref 36.0–46.0)
Hemoglobin: 13.5 g/dL (ref 12.0–15.0)
MCH: 30.6 pg (ref 26.0–34.0)
MCHC: 33.4 g/dL (ref 30.0–36.0)
MCV: 91.6 fL (ref 80.0–100.0)
Platelets: 211 10*3/uL (ref 150–400)
RBC: 4.41 MIL/uL (ref 3.87–5.11)
RDW: 13.6 % (ref 11.5–15.5)
WBC: 5.8 10*3/uL (ref 4.0–10.5)
nRBC: 0 % (ref 0.0–0.2)

## 2023-04-10 MED ORDER — MECLIZINE HCL 25 MG PO TABS: 25.0000 mg | ORAL_TABLET | Freq: Three times a day (TID) | ORAL | 0 refills | Status: DC | PRN

## 2023-04-10 NOTE — ED Triage Notes (Signed)
 Pt fell 5 days ago and was seen here. PT concerned with increased bruising on right side of cheek down to neck. Pt also has dizziness and headache.

## 2023-04-10 NOTE — ED Provider Notes (Signed)
 [facility] Provider Note    Event Date/Time   First MD Initiated Contact with Patient 04/10/23 1903     (approximate)   History   Dizziness and Headache   HPI  Katie Neal is a 53 y.o. female who presents to the emergency department today because of concerns for continued dizziness and expanding bruising on her face.  Patient had a fall 5 days ago.  At that time did have some bruising to the left side of her face and under her eyes.  Since then however she has noticed the bruising extending down the left side of her face and under her left chin.  Additionally bruising under her eyes has been migrating down as well.  During this time the patient states that she has been having dizziness.  It is worse when she gets up and moves around.  States that she has a history of vertigo.     Physical Exam   Triage Vital Signs: ED Triage Vitals  Encounter Vitals Group     BP 04/10/23 1531 (!) 101/58     Systolic BP Percentile --      Diastolic BP Percentile --      Pulse Rate 04/10/23 1531 88     Resp 04/10/23 1531 18     Temp 04/10/23 1531 98.4 F (36.9 C)     Temp Source 04/10/23 1531 Oral     SpO2 04/10/23 1531 99 %     Weight 04/10/23 1532 190 lb 0.6 oz (86.2 kg)     Height 04/10/23 1532 5\' 5"  (1.651 m)     Head Circumference --      Peak Flow --      Pain Score 04/10/23 1532 7     Pain Loc --      Pain Education --      Exclude from Growth Chart --     Most recent vital signs: Vitals:   04/10/23 1531 04/10/23 1813  BP: (!) 101/58 94/62  Pulse: 88 66  Resp: 18 18  Temp: 98.4 F (36.9 C) 98.4 F (36.9 C)  SpO2: 99% 97%   General: Awake, alert, oriented. CV:  Good peripheral perfusion. Regular rate and rhythm. Resp:  Normal effort. Lungs clear. Abd:  No distention.  Skin:  Bruising noted underneath both eyes, on the left side of her face and chin.  ED Results / Procedures / Treatments   Labs (all labs ordered are listed, but only  abnormal results are displayed) Labs Reviewed  BASIC METABOLIC PANEL - Abnormal; Notable for the following components:      Result Value   Potassium 3.4 (*)    Glucose, Bld 108 (*)    Calcium 8.7 (*)    All other components within normal limits  CBC  URINALYSIS, ROUTINE W REFLEX MICROSCOPIC     EKG  I, Phineas Semen, attending physician, personally viewed and interpreted this EKG  EKG Time: 1530 Rate: 79 Rhythm: normal sinus rhythm Axis: normal Intervals: qtc 444 QRS: narrow, q waves III, aVF ST changes: no st elevation Impression: abnormal ekg   RADIOLOGY None  PROCEDURES:  Critical Care performed: No    MEDICATIONS ORDERED IN ED: Medications - No data to display   IMPRESSION / MDM / ASSESSMENT AND PLAN / ED COURSE  I reviewed the triage vital signs and the nursing notes.  Differential diagnosis includes, but is not limited to, anemia, electrolyte abnormality, vertigo, bruising  Patient's presentation is most consistent with acute presentation with potential threat to life or bodily function.   Patient presented to the emergency department tonight because of concerns for changing of the bruising to her face as well as continued dizziness.  In terms of the bruising it does look like normal evolution of bruise.  Did discuss that bruises can migrate.  Blood work here without any concerning anemia or electrolyte abnormality.  Given patient's history of vertigo that the dizziness is worse with movement I do wonder if patient is suffering from vertigo again.  Will plan on giving patient prescription for meclizine.     FINAL CLINICAL IMPRESSION(S) / ED DIAGNOSES   Final diagnoses:  Dizziness  Ecchymosis      Note:  This document was prepared using Dragon voice recognition software and may include unintentional dictation errors.    Phineas Semen, MD 04/10/23 Rosamaria Lints

## 2023-04-10 NOTE — ED Triage Notes (Signed)
 First nurse note: Pt here from [facility] clinic with c/o of fall and more bruising, pt seen this past Friday. VSS. Pt denies blood thinner use.  Pt also c/o of dizziness.

## 2023-04-24 ENCOUNTER — Other Ambulatory Visit: Payer: Self-pay | Admitting: Student

## 2023-04-24 DIAGNOSIS — R519 Headache, unspecified: Secondary | ICD-10-CM

## 2023-09-20 ENCOUNTER — Other Ambulatory Visit: Payer: Self-pay | Admitting: Student

## 2023-09-20 DIAGNOSIS — R413 Other amnesia: Secondary | ICD-10-CM

## 2023-09-20 DIAGNOSIS — G319 Degenerative disease of nervous system, unspecified: Secondary | ICD-10-CM

## 2023-09-20 DIAGNOSIS — R42 Dizziness and giddiness: Secondary | ICD-10-CM

## 2023-09-20 DIAGNOSIS — R202 Paresthesia of skin: Secondary | ICD-10-CM

## 2023-09-20 DIAGNOSIS — R569 Unspecified convulsions: Secondary | ICD-10-CM

## 2023-09-20 DIAGNOSIS — R519 Headache, unspecified: Secondary | ICD-10-CM

## 2023-10-02 ENCOUNTER — Ambulatory Visit
Admission: RE | Admit: 2023-10-02 | Discharge: 2023-10-02 | Disposition: A | Discharge status: Home / Self Care | Source: Ambulatory Visit | Attending: Student | Admitting: Student

## 2023-10-02 DIAGNOSIS — R200 Anesthesia of skin: Secondary | ICD-10-CM | POA: Diagnosis present

## 2023-10-02 DIAGNOSIS — G319 Degenerative disease of nervous system, unspecified: Secondary | ICD-10-CM | POA: Insufficient documentation

## 2023-10-02 DIAGNOSIS — R42 Dizziness and giddiness: Secondary | ICD-10-CM | POA: Insufficient documentation

## 2023-10-02 DIAGNOSIS — R413 Other amnesia: Secondary | ICD-10-CM | POA: Diagnosis present

## 2023-10-02 DIAGNOSIS — R569 Unspecified convulsions: Secondary | ICD-10-CM | POA: Insufficient documentation

## 2023-10-02 DIAGNOSIS — R519 Headache, unspecified: Secondary | ICD-10-CM | POA: Insufficient documentation

## 2023-10-02 DIAGNOSIS — R202 Paresthesia of skin: Secondary | ICD-10-CM | POA: Insufficient documentation

## 2023-12-16 ENCOUNTER — Emergency Department
Admission: EM | Admit: 2023-12-16 | Discharge: 2023-12-16 | Disposition: A | Discharge status: Home / Self Care | Source: Ambulatory Visit | Attending: Emergency Medicine | Admitting: Emergency Medicine

## 2023-12-16 ENCOUNTER — Other Ambulatory Visit: Payer: Self-pay

## 2023-12-16 DIAGNOSIS — R42 Dizziness and giddiness: Secondary | ICD-10-CM | POA: Insufficient documentation

## 2023-12-16 LAB — COMPREHENSIVE METABOLIC PANEL WITH GFR
ALT: 13 U/L (ref 0–44)
AST: 16 U/L (ref 15–41)
Albumin: 4.3 g/dL (ref 3.5–5.0)
Alkaline Phosphatase: 87 U/L (ref 38–126)
Anion gap: 11 (ref 5–15)
BUN: 6 mg/dL (ref 6–20)
CO2: 29 mmol/L (ref 22–32)
Calcium: 9 mg/dL (ref 8.9–10.3)
Chloride: 100 mmol/L (ref 98–111)
Creatinine, Ser: 0.88 mg/dL (ref 0.44–1.00)
GFR, Estimated: >60 mL/min (ref >60)
Glucose, Bld: 161 mg/dL — ABNORMAL HIGH (ref 70–99)
Potassium: 3.1 mmol/L — ABNORMAL LOW (ref 3.5–5.1)
Sodium: 139 mmol/L (ref 135–145)
Total Bilirubin: 0.8 mg/dL (ref 0.0–1.2)
Total Protein: 6.4 g/dL — ABNORMAL LOW (ref 6.5–8.1)

## 2023-12-16 LAB — CBC
HCT: 40.7 % (ref 36.0–46.0)
Hemoglobin: 13.9 g/dL (ref 12.0–15.0)
MCH: 29.8 pg (ref 26.0–34.0)
MCHC: 34.2 g/dL (ref 30.0–36.0)
MCV: 87.2 fL (ref 80.0–100.0)
Platelets: 179 K/uL (ref 150–400)
RBC: 4.67 MIL/uL (ref 3.87–5.11)
RDW: 12.6 % (ref 11.5–15.5)
WBC: 6.3 K/uL (ref 4.0–10.5)
nRBC: 0 % (ref 0.0–0.2)

## 2023-12-16 LAB — URINALYSIS, ROUTINE W REFLEX MICROSCOPIC
Bilirubin Urine: NEGATIVE
Glucose, UA: NEGATIVE mg/dL
Hgb urine dipstick: NEGATIVE
Ketones, ur: NEGATIVE mg/dL
Nitrite: NEGATIVE
Protein, ur: NEGATIVE mg/dL
Specific Gravity, Urine: 1.004 — ABNORMAL LOW (ref 1.005–1.030)
pH: 6 (ref 5.0–8.0)

## 2023-12-16 MED ORDER — SODIUM CHLORIDE 0.9 % IV SOLN: Freq: Once | INTRAVENOUS | Status: AC

## 2023-12-16 NOTE — ED Notes (Addendum)
 Orthostatic VS  CW     Orthostatic LyingBP- Lying: 109/76Pulse- Lying: 80 Orthostatic SittingBP- Sitting: 109/81Pulse- Sitting: 81 Orthostatic Standing BP- Standing: 114/82Pulse- Standing: 88

## 2023-12-16 NOTE — ED Triage Notes (Addendum)
 C/O dizziness intermittently x several months. Presented to [facility] today, initial BP 101/81, 250 cc NS IV given and repeat BP 79/52.  Patient is AAOx3. Skin warm and dry. NAD.  Negative for flu, Covid, RSV at [facility] today

## 2023-12-16 NOTE — ED Provider Notes (Signed)
 [facility] Provider Note    Event Date/Time   First MD Initiated Contact with Patient 12/16/23 1749     (approximate)   History   Dizziness   HPI  Katie Neal is a 53 y.o. female with a history of CRPS presents from clinic for dizziness and low blood pressure.  Review of record demonstrates the patient has borderline low pressure chronically.  However she has been having some dizziness with change in position recently.  She denies fevers or chills.  No abdominal pain, no chest pain, no palpitations.  This has been an ongoing problem for her     Physical Exam   Triage Vital Signs: ED Triage Vitals  Encounter Vitals Group     BP 12/16/23 1425 98/71     Girls Systolic BP Percentile --      Girls Diastolic BP Percentile --      Boys Systolic BP Percentile --      Boys Diastolic BP Percentile --      Pulse Rate 12/16/23 1425 69     Resp 12/16/23 1425 16     Temp 12/16/23 1425 98.1 F (36.7 C)     Temp Source 12/16/23 1425 Oral     SpO2 12/16/23 1425 93 %     Weight 12/16/23 1426 86.2 kg (190 lb 0.6 oz)     Height --      Head Circumference --      Peak Flow --      Pain Score 12/16/23 1426 0     Pain Loc --      Pain Education --      Exclude from Growth Chart --     Most recent vital signs: Vitals:   12/16/23 1816 12/16/23 1911  BP: 124/88 114/82  Pulse: 88 87  Resp: 18 18  Temp: 98.6 F (37 C)   SpO2: 92% 97%     General: Awake, no distress.  CV:  Good peripheral perfusion.  Resp:  Normal effort.  Abd:  No distention.  Other:     ED Results / Procedures / Treatments   Labs (all labs ordered are listed, but only abnormal results are displayed) Labs Reviewed  COMPREHENSIVE METABOLIC PANEL WITH GFR - Abnormal; Notable for the following components:      Result Value   Potassium 3.1 (*)    Glucose, Bld 161 (*)    Total Protein 6.4 (*)    All other components within normal limits  URINALYSIS, ROUTINE W REFLEX  MICROSCOPIC - Abnormal; Notable for the following components:   Color, Urine YELLOW (*)    APPearance HAZY (*)    Specific Gravity, Urine 1.004 (*)    Leukocytes,Ua SMALL (*)    Bacteria, UA MANY (*)    All other components within normal limits  CBC  CBG MONITORING, ED     EKG  ED ECG REPORT I, Lamar Price, the attending physician, personally viewed and interpreted this ECG.  Date: 12/16/2023 EKG Time:  Rate:  Rhythm: normal sinus rhythm QRS Axis: normal Intervals: Prolonged QTc ST/T Wave abnormalities: normal Narrative Interpretation:     RADIOLOGY    PROCEDURES:  Critical Care performed:   Procedures   MEDICATIONS ORDERED IN ED: Medications  0.9 %  sodium chloride  infusion (0 mLs Intravenous Stopped 12/16/23 1917)     IMPRESSION / MDM / ASSESSMENT AND PLAN / ED COURSE  I reviewed the triage vital signs and the nursing notes. Patient's presentation is most  consistent with acute presentation with potential threat to life or bodily function.  Patient sent from clinic with hypotension, blood pressure is improved here but still borderline.  However not significantly off her typical blood pressure.  She has no chest pain or palpitations.  Lab work is overall reassuring, EKG not significantly changed from prior.  Will treat with IV fluids and reevaluate orthostatic  After fluids patient is feeling better, no further dizziness, orthostatics normal, appropriate for discharge at this time with close outpatient follow-up.  She agrees to this plan      FINAL CLINICAL IMPRESSION(S) / ED DIAGNOSES   Final diagnoses:  Dizziness     Rx / DC Orders   ED Discharge Orders     None        Note:  This document was prepared using Dragon voice recognition software and may include unintentional dictation errors.   Arlander Charleston, MD 12/16/23 2018

## 2023-12-18 ENCOUNTER — Observation Stay

## 2023-12-18 ENCOUNTER — Inpatient Hospital Stay
Admission: EM | Admit: 2023-12-18 | Discharge: 2023-12-24 | DRG: 493 | Disposition: A | Discharge status: Skilled Nursing Facility | Attending: Internal Medicine | Admitting: Internal Medicine

## 2023-12-18 ENCOUNTER — Other Ambulatory Visit: Payer: Self-pay

## 2023-12-18 ENCOUNTER — Emergency Department

## 2023-12-18 DIAGNOSIS — G43909 Migraine, unspecified, not intractable, without status migrainosus: Secondary | ICD-10-CM | POA: Diagnosis present

## 2023-12-18 DIAGNOSIS — K589 Irritable bowel syndrome without diarrhea: Secondary | ICD-10-CM | POA: Diagnosis present

## 2023-12-18 DIAGNOSIS — Z7951 Long term (current) use of inhaled steroids: Secondary | ICD-10-CM

## 2023-12-18 DIAGNOSIS — E785 Hyperlipidemia, unspecified: Secondary | ICD-10-CM | POA: Diagnosis present

## 2023-12-18 DIAGNOSIS — Z79890 Hormone replacement therapy (postmenopausal): Secondary | ICD-10-CM

## 2023-12-18 DIAGNOSIS — J9611 Chronic respiratory failure with hypoxia: Secondary | ICD-10-CM | POA: Diagnosis present

## 2023-12-18 DIAGNOSIS — S82892A Other fracture of left lower leg, initial encounter for closed fracture: Secondary | ICD-10-CM | POA: Diagnosis not present

## 2023-12-18 DIAGNOSIS — G9050 Complex regional pain syndrome I, unspecified: Secondary | ICD-10-CM | POA: Diagnosis not present

## 2023-12-18 DIAGNOSIS — E039 Hypothyroidism, unspecified: Secondary | ICD-10-CM | POA: Diagnosis present

## 2023-12-18 DIAGNOSIS — J4489 Other specified chronic obstructive pulmonary disease: Secondary | ICD-10-CM | POA: Diagnosis present

## 2023-12-18 DIAGNOSIS — Y92009 Unspecified place in unspecified non-institutional (private) residence as the place of occurrence of the external cause: Secondary | ICD-10-CM

## 2023-12-18 DIAGNOSIS — S82852A Displaced trimalleolar fracture of left lower leg, initial encounter for closed fracture: Principal | ICD-10-CM | POA: Diagnosis present

## 2023-12-18 DIAGNOSIS — Z6831 Body mass index (BMI) 31.0-31.9, adult: Secondary | ICD-10-CM

## 2023-12-18 DIAGNOSIS — Z885 Allergy status to narcotic agent status: Secondary | ICD-10-CM

## 2023-12-18 DIAGNOSIS — F419 Anxiety disorder, unspecified: Secondary | ICD-10-CM | POA: Diagnosis present

## 2023-12-18 DIAGNOSIS — W19XXXA Unspecified fall, initial encounter: Secondary | ICD-10-CM | POA: Diagnosis present

## 2023-12-18 DIAGNOSIS — Z833 Family history of diabetes mellitus: Secondary | ICD-10-CM

## 2023-12-18 DIAGNOSIS — E66811 Obesity, class 1: Secondary | ICD-10-CM | POA: Diagnosis present

## 2023-12-18 DIAGNOSIS — G40909 Epilepsy, unspecified, not intractable, without status epilepticus: Secondary | ICD-10-CM | POA: Diagnosis present

## 2023-12-18 DIAGNOSIS — N179 Acute kidney failure, unspecified: Secondary | ICD-10-CM | POA: Diagnosis present

## 2023-12-18 DIAGNOSIS — Z79620 Long term (current) use of immunosuppressive biologic: Secondary | ICD-10-CM

## 2023-12-18 DIAGNOSIS — F4310 Post-traumatic stress disorder, unspecified: Secondary | ICD-10-CM | POA: Diagnosis present

## 2023-12-18 DIAGNOSIS — E876 Hypokalemia: Secondary | ICD-10-CM | POA: Diagnosis present

## 2023-12-18 DIAGNOSIS — Z9981 Dependence on supplemental oxygen: Secondary | ICD-10-CM

## 2023-12-18 DIAGNOSIS — Z8051 Family history of malignant neoplasm of kidney: Secondary | ICD-10-CM

## 2023-12-18 DIAGNOSIS — R569 Unspecified convulsions: Secondary | ICD-10-CM

## 2023-12-18 DIAGNOSIS — G90A Postural orthostatic tachycardia syndrome (POTS): Secondary | ICD-10-CM | POA: Diagnosis present

## 2023-12-18 DIAGNOSIS — G9059 Complex regional pain syndrome I of other specified site: Secondary | ICD-10-CM | POA: Diagnosis present

## 2023-12-18 DIAGNOSIS — E669 Obesity, unspecified: Secondary | ICD-10-CM | POA: Diagnosis present

## 2023-12-18 DIAGNOSIS — F32A Depression, unspecified: Secondary | ICD-10-CM | POA: Diagnosis present

## 2023-12-18 DIAGNOSIS — I959 Hypotension, unspecified: Secondary | ICD-10-CM | POA: Diagnosis present

## 2023-12-18 DIAGNOSIS — F418 Other specified anxiety disorders: Secondary | ICD-10-CM | POA: Diagnosis present

## 2023-12-18 DIAGNOSIS — Z87891 Personal history of nicotine dependence: Secondary | ICD-10-CM

## 2023-12-18 DIAGNOSIS — Z79899 Other long term (current) drug therapy: Secondary | ICD-10-CM

## 2023-12-18 DIAGNOSIS — Z7982 Long term (current) use of aspirin: Secondary | ICD-10-CM

## 2023-12-18 DIAGNOSIS — R42 Dizziness and giddiness: Secondary | ICD-10-CM

## 2023-12-18 LAB — COMPREHENSIVE METABOLIC PANEL WITH GFR
ALT: 12 U/L (ref 0–44)
AST: 16 U/L (ref 15–41)
Albumin: 4 g/dL (ref 3.5–5.0)
Alkaline Phosphatase: 78 U/L (ref 38–126)
Anion gap: 10 (ref 5–15)
BUN: <5 mg/dL — ABNORMAL LOW (ref 6–20)
CO2: 26 mmol/L (ref 22–32)
Calcium: 8.6 mg/dL — ABNORMAL LOW (ref 8.9–10.3)
Chloride: 100 mmol/L (ref 98–111)
Creatinine, Ser: 1.17 mg/dL — ABNORMAL HIGH (ref 0.44–1.00)
GFR, Estimated: 56 mL/min — ABNORMAL LOW (ref >60)
Glucose, Bld: 174 mg/dL — ABNORMAL HIGH (ref 70–99)
Potassium: 3.1 mmol/L — ABNORMAL LOW (ref 3.5–5.1)
Sodium: 136 mmol/L (ref 135–145)
Total Bilirubin: 0.6 mg/dL (ref 0.0–1.2)
Total Protein: 5.8 g/dL — ABNORMAL LOW (ref 6.5–8.1)

## 2023-12-18 LAB — CBC
HCT: 38.2 % (ref 36.0–46.0)
Hemoglobin: 12.9 g/dL (ref 12.0–15.0)
MCH: 29.8 pg (ref 26.0–34.0)
MCHC: 33.8 g/dL (ref 30.0–36.0)
MCV: 88.2 fL (ref 80.0–100.0)
Platelets: 187 K/uL (ref 150–400)
RBC: 4.33 MIL/uL (ref 3.87–5.11)
RDW: 12.8 % (ref 11.5–15.5)
WBC: 9.1 K/uL (ref 4.0–10.5)
nRBC: 0 % (ref 0.0–0.2)

## 2023-12-18 LAB — CBG MONITORING, ED: Glucose-Capillary: 156 mg/dL — ABNORMAL HIGH (ref 70–99)

## 2023-12-18 LAB — TROPONIN T, HIGH SENSITIVITY: Troponin T High Sensitivity: <15 ng/L (ref 0–19)

## 2023-12-18 MED ORDER — SODIUM CHLORIDE 0.9 % IV BOLUS
1000.0000 mL | Freq: Once | INTRAVENOUS | Status: AC
Start: 2023-12-18 — End: 2023-12-18
  Administered 2023-12-18: 1000 mL via INTRAVENOUS

## 2023-12-18 MED ORDER — FENTANYL CITRATE (PF) 50 MCG/ML IJ SOSY
50.0000 ug | PREFILLED_SYRINGE | Freq: Once | INTRAMUSCULAR | Status: AC
  Administered 2023-12-18: 50 ug via INTRAVENOUS
  Filled 2023-12-18: qty 1

## 2023-12-18 MED ORDER — MECLIZINE HCL 25 MG PO TABS
25.0000 mg | ORAL_TABLET | Freq: Once | ORAL | Status: AC
  Administered 2023-12-18: 25 mg via ORAL
  Filled 2023-12-18: qty 1

## 2023-12-18 NOTE — ED Notes (Signed)
 Pt BP 79/58 unwilling to allow RN to take BP on upper arm. Repeat BP 97/65 (76) to right ankle.

## 2023-12-18 NOTE — ED Notes (Signed)
 Elevated pt's l. foot

## 2023-12-18 NOTE — H&P (Signed)
 History and Physical    [name] FMW:[id number] DOB: [birth year] DOA: 12/18/2023  Referring MD/NP/PA:   PCP: [facility]   Patient coming from:  The patient is coming from home.     Chief Complaint: Dizziness, fall, left ankle pain  HPI: Katie Neal is a 53 y.o. female with medical history significant of migraine headaches, CRPS (complex regional pain syndrome type I, involving internal organs including bladder), hyperlipidemia, asthma, GERD, hypothyroidism, depression with anxiety, PTSD, IBS, , insomnia, seizure, who presents with Dizziness, fall, left ankle pain.  Pt states that due to hx of CRPS, she has intermittent dizziness. Today she was being assisted by her son to the bathroom when she began to feel dizzy and fell, hurting her left ankle.  Unsure if she hit her head.  No LOC.  She developed the pain in the left ankle, which is constant, sharp, severe, nonradiating, aggravated by movement.  Patient has nausea, no vomiting, diarrhea or abdominal pain.  No symptoms of UTI. Patient states that because of SOB and wheezing, her doctor put her on prednisone  and Z-Pak since yesterday.  She still has mild SOB and dry cough, no chest pain, fever or chills.  Patient was initially hypotensive with blood pressure 79/58, which improved to 108/68 after giving 1 L normal saline in ED.  Patient states that she is taking metoprolol  for tachycardia at home.  Data reviewed independently and ED Course: pt was found to have WBC 9.1, potassium 3.1, troponin<15, mild AKI.  Temperature normal, heart rate 74, RR 17, oxygen saturation 99% on room air.  CT of head negative for acute intracranial abnormalities. CT-left ankle showed Trimalleolar left ankle fracture.  Patient is admitted to telemetry bed as inpatient.  Dr. Magdalen of podiatry is consulted.    EKG: I have personally reviewed.  Sinus rhythm, QTc 470, Q wave in lead III/aVF, low voltage, early R wave progression.   Review of  Systems:   General: no fevers, chills, no body weight gain, has fatigue HEENT: no blurry vision, hearing changes or sore throat Respiratory: has dyspnea, coughing, no wheezing CV: no chest pain, no palpitations GI: no nausea, vomiting, abdominal pain, diarrhea, constipation GU: no dysuria, burning on urination, increased urinary frequency, hematuria  Ext: no leg edema Neuro: no unilateral weakness, numbness, or tingling, no vision change or hearing loss. Has fall, dizziness.  Skin: no rash, no skin tear. MSK: has left ankle pain Heme: No easy bruising.  Travel history: No recent long distant travel.   Allergy:  Allergies  Allergen Reactions   Codeine Itching    Can take with benadryl      Past Medical History:  Diagnosis Date   Arthritis    Asthma    Cardiomegaly    Cerebral atrophy    Complex regional pain syndrome type 1    COPD (chronic obstructive pulmonary disease) (HCC)    CRPS (complex regional pain syndrome type I)    Depression    Diaphragmatic hernia    Dyspnea    Excessive falling    Headache    History of chronic sinusitis    History of kidney stones    Hyperlipidemia    Hypothyroidism    IBS (irritable bowel syndrome)    Insomnia    Memory difficulty    Osteoarthritis of knee    Pneumonia    Post concussion syndrome    PTSD (post-traumatic stress disorder)    Seizure-like activity (HCC)    Sleep apnea  not on Cpap machine but is on oxygen    Urinary incontinence    Vasovagal syncope     Past Surgical History:  Procedure Laterality Date   ABDOMINAL HYSTERECTOMY     ACHILLES TENDON REPAIR  1982   APPENDECTOMY     CYSTOSCOPY WITH STENT PLACEMENT Right 06/27/2019   Procedure: CYSTOSCOPY WITH STENT PLACEMENT;  Surgeon: Carolee Sherwood JONETTA DOUGLAS, MD;  Location: [facility] ORS;  Service: Urology;  Laterality: Right;   CYSTOSCOPY/RETROGRADE/URETEROSCOPY  07/14/2019   Procedure: CYSTOSCOPY/RETROGRADE/URETEROSCOPY;  Surgeon: Twylla Glendia BROCKS, MD;  Location: [facility]  ORS;  Service: Urology;;   CYSTOSCOPY/URETEROSCOPY/HOLMIUM LASER/STENT PLACEMENT Right 07/14/2019   Procedure: CYSTOSCOPY/URETEROSCOPY/HOLMIUM LASER/STENT Exchange;  Surgeon: Twylla Glendia BROCKS, MD;  Location: [facility] ORS;  Service: Urology;  Laterality: Right;   ESOPHAGOGASTRODUODENOSCOPY N/A 10/21/2020   Procedure: ESOPHAGOGASTRODUODENOSCOPY (EGD);  Surgeon: Maryruth Ole DASEN, MD;  Location: [facility] ENDOSCOPY;  Service: Endoscopy;  Laterality: N/A;   FOOT SURGERY  2002   due to deformity   HIP SURGERY  2012   KNEE ARTHROSCOPY     LAPAROSCOPIC HYSTERECTOMY  2000   REPAIR TENDONS LEG     x2    Social History:  reports that she quit smoking about 10 years ago. Her smoking use included cigarettes. She has never used smokeless tobacco. She reports that she does not drink alcohol and does not use drugs.  Family History:  Family History  Problem Relation Age of Onset   Diabetes Mellitus II Father    Kidney cancer Maternal Grandmother      Prior to Admission medications   Medication Sig Start Date End Date Taking? Authorizing Provider  albuterol  (PROVENTIL ) (2.5 MG/3ML) 0.083% nebulizer solution Take 2.5 mg by nebulization every 6 (six) hours as needed for wheezing or shortness of breath.     [provider]  amitriptyline  (ELAVIL ) 25 MG tablet Take 25 mg by mouth at bedtime.     [provider]  BENRALIZUMAB [place] Inject into the skin.    [provider]  budesonide (PULMICORT) 0.25 MG/2ML nebulizer solution Take 0.25 mg by nebulization 2 (two) times daily.    [provider]  budesonide-formoterol  (SYMBICORT) 80-4.5 MCG/ACT inhaler Inhale 2 puffs into the lungs 2 (two) times daily.    [provider]  dicyclomine  (BENTYL ) 20 MG tablet Take 20 mg by mouth 3 (three) times daily before meals.    [provider]  fluticasone  (FLONASE ) 50 MCG/ACT nasal spray Place 2 sprays into both nostrils daily as needed.     [provider]  gabapentin   (NEURONTIN ) 100 MG capsule Take 100 mg by mouth 3 (three) times daily.    [provider]  hydrOXYzine  (VISTARIL ) 25 MG capsule Take 25 mg by mouth 3 (three) times daily. 06/18/19   [provider]  ipratropium (ATROVENT ) 0.03 % nasal spray Place 2 sprays into both nostrils every 12 (twelve) hours.    [provider]  lamoTRIgine  (LAMICTAL ) 100 MG tablet Take 100-200 mg by mouth See admin instructions. Take 1 tablet (100mg ) by mouth every morning and take 2 tablets (200mg ) by mouth at bedtime    [provider]  levocetirizine (XYZAL ) 5 MG tablet SMARTSIG:1 Tablet(s) By Mouth Every Evening 04/12/19   [provider]  levorphanol (LEVODROMORAN) 2 MG tablet Take 2 mg by mouth every 8 (eight) hours as needed for pain.     [provider]  levothyroxine  (SYNTHROID , LEVOTHROID) 137 MCG tablet Take 1 tablet by mouth 6 days/week. Take half tablet (68.5  mcg) on Sunday. Take on an empty stomach with a glass of water at least 30-60 minutes before breakfast. 11/10/15   [provider]  lidocaine  (LIDODERM ) 5 % Place 1 patch onto the skin daily. Remove & Discard patch within 12 hours or as directed by MD    [provider]  meclizine  (ANTIVERT ) 25 MG tablet Take 1 tablet (25 mg total) by mouth 3 (three) times daily as needed for dizziness. 04/10/23   Goodman, Graydon, MD  metoprolol  succinate (TOPROL -XL) 25 MG 24 hr tablet Take 25 mg by mouth daily.    [provider]  montelukast  (SINGULAIR ) 10 MG tablet Take 10 mg by mouth daily. 12/06/15   [provider]  Multiple Vitamin (MULTIVITAMIN WITH MINERALS) TABS tablet Take 1 tablet by mouth daily.    [provider]  Olopatadine  HCl 0.7 % SOLN Place 1 drop into both eyes 2 (two) times daily as needed (allergy symptoms).     [provider]  ondansetron  (ZOFRAN -ODT) 4 MG disintegrating tablet Take 4 mg by mouth every 8 (eight) hours as needed for nausea. 01/13/15    [provider]  pantoprazole  (PROTONIX ) 40 MG tablet Take 40 mg by mouth daily.    [provider]  QUEtiapine  (SEROQUEL ) 25 MG tablet Take 25 mg by mouth at bedtime. 06/05/19   [provider]  simvastatin  (ZOCOR ) 10 MG tablet Take 10 mg by mouth at bedtime.     [provider]  tiotropium (SPIRIVA ) 18 MCG inhalation capsule Place 18 mcg into inhaler and inhale daily.    [provider]  tiZANidine  (ZANAFLEX ) 4 MG tablet Take 4 mg by mouth every 8 (eight) hours as needed for muscle spasms.    [provider]  traZODone  (DESYREL ) 100 MG tablet Take 2 tablets (200 mg total) by mouth at bedtime. Needs office visit 12/14/11   Hadassah Powell HERO, PA-C  venlafaxine  XR (EFFEXOR -XR) 75 MG 24 hr capsule Take 225 mg by mouth daily with breakfast.    [provider]  vitamin C (ASCORBIC ACID ) 500 MG tablet Take 1,000 mg by mouth daily.    [provider]    Physical Exam: Vitals:   12/18/23 2007 12/18/23 2010 12/18/23 2259 12/19/23 0101  BP: (!) 79/58 97/65 108/68 106/78  Pulse: 71  74 81  Resp: 15  17 18   Temp: 97.9 F (36.6 C)  97.8 F (36.6 C) 98.8 F (37.1 C)  TempSrc: Oral   Oral  SpO2: 99%  99% 93%  Height:    5' 5 (1.651 m)   General: Not in acute distress HEENT:       Eyes: PERRL, EOMI, no jaundice       ENT: No discharge from the ears and nose, no pharynx injection, no tonsillar enlargement.        Neck: No JVD, no bruit, no mass felt. Heme: No neck lymph node enlargement. Cardiac: S1/S2, RRR, No murmurs, No gallops or rubs. Respiratory: No rales, wheezing, rhonchi or rubs. GI: Soft, nondistended, nontender, no rebound pain, no organomegaly, BS present. GU: No hematuria Ext: No pitting leg edema bilaterally. 1+DP/PT pulse bilaterally. Musculoskeletal: has left ankle tenderness. Skin: No rashes.  Neuro: Alert, oriented X3, cranial nerves II-XII grossly intact, moves all extremities normally.  Psych: Patient is  not psychotic, no suicidal or hemocidal ideation.  Labs on Admission: I have personally reviewed following labs and imaging studies  CBC: Recent Labs  Lab 12/16/23 1432 12/18/23 2013  WBC 6.3 9.1  HGB 13.9 12.9  HCT 40.7 38.2  MCV 87.2 88.2  PLT 179 187   Basic Metabolic Panel: Recent Labs  Lab 12/16/23 1432 12/18/23 2013  NA 139 136  K 3.1* 3.1*  CL 100 100  CO2 29 26  GLUCOSE 161* 174*  BUN 6 <5*  CREATININE 0.88 1.17*  CALCIUM 9.0 8.6*   GFR: Estimated Creatinine Clearance: 61 mL/min (A) (by C-G formula based on SCr of 1.17 mg/dL (H)). Liver Function Tests: Recent Labs  Lab 12/16/23 1432 12/18/23 2013  AST 16 16  ALT 13 12  ALKPHOS 87 78  BILITOT 0.8 0.6  PROT 6.4* 5.8*  ALBUMIN 4.3 4.0   No results for input(s): LIPASE, AMYLASE in the last 168 hours. No results for input(s): AMMONIA in the last 168 hours. Coagulation Profile: No results for input(s): INR, PROTIME in the last 168 hours. Cardiac Enzymes: No results for input(s): CKTOTAL, CKMB, CKMBINDEX, TROPONINI in the last 168 hours. BNP (last 3 results) No results for input(s): PROBNP in the last 8760 hours. HbA1C: No results for input(s): HGBA1C in the last 72 hours. CBG: Recent Labs  Lab 12/18/23 2017  GLUCAP 156*   Lipid Profile: No results for input(s): CHOL, HDL, LDLCALC, TRIG, CHOLHDL, LDLDIRECT in the last 72 hours. Thyroid  Function Tests: No results for input(s): TSH, T4TOTAL, FREET4, T3FREE, THYROIDAB in the last 72 hours. Anemia Panel: No results for input(s): VITAMINB12, FOLATE, FERRITIN, TIBC, IRON, RETICCTPCT in the last 72 hours. Urine analysis:    Component Value Date/Time   COLORURINE YELLOW (A) 12/16/2023 1432   APPEARANCEUR HAZY (A) 12/16/2023 1432   APPEARANCEUR Cloudy (A) 08/07/2019 1436   LABSPEC 1.004 (L) 12/16/2023 1432   PHURINE 6.0 12/16/2023 1432   GLUCOSEU NEGATIVE 12/16/2023 1432   HGBUR NEGATIVE  12/16/2023 1432   BILIRUBINUR NEGATIVE 12/16/2023 1432   BILIRUBINUR Negative 08/07/2019 1436   KETONESUR NEGATIVE 12/16/2023 1432   PROTEINUR NEGATIVE 12/16/2023 1432   UROBILINOGEN 0.2 03/02/2009 1415   NITRITE NEGATIVE 12/16/2023 1432   LEUKOCYTESUR SMALL (A) 12/16/2023 1432   Sepsis Labs: @LABRCNTIP (procalcitonin:4,lacticidven:4) )No results found for this or any previous visit (from the past 240 hours).   Radiological Exams on Admission:   Assessment/Plan Principal Problem:   Closed left ankle fracture Active Problems:   CRPS (complex regional pain syndrome type I)   Fall at home, initial encounter   COPD with asthma (HCC)   HLD (hyperlipidemia)   Hypothyroidism   Seizure (HCC)   Hypokalemia   IBS (irritable bowel syndrome)   AKI (acute kidney injury)   Hypotension   Migraine   Depression with anxiety   Obesity (BMI 35.0-39.9 without comorbidity)   Assessment and Plan:   Closed left ankle fracture: CT-left ankle showed Trimalleolar left ankle fracture. Consulted Dr. Alvie of podiatry. - will admit to tele bed as inpt - Pain control: prn fentanyl  and tylenol  - Continue home pain medications: Levorphanol 2 mg q8h prn - Tizanidine  prn for muscle spasm - type and cross - INR/PTT  Fall: -PT/OT when able to (not ordered now) -fall precaution  CRPS (complex regional pain syndrome type I) -Continue home Neurontin , Levophanol  COPD with asthma (HCC): No wheezing today -Bronchodilators and as needed Mucinex  - Continue home prednisone  20 mg daily and azithromycin  250 mg daily  HLD (hyperlipidemia) -Zocor   Hypothyroidism -Synthroid   Seizure (HCC) -Seizure precaution - As needed Ativan  for seizure - Continue home Lamictal  100-200 mg twice daily  Hypokalemia: Potassium 3.1 -Repleted potassium - Check magnesium level and  phosphorus level  IBS (irritable bowel syndrome) -As needed Bentyl   AKI (acute kidney injury): Baseline creatinine 0.88 recently.   Her creatinine is 1.17, BUN<5.0, GFR 56. -IV fluid: 1 L normal saline, NS 75 cc/h  Hypotension: Responded to IV fluid resuscitation.  Blood pressure improved from 79/58 108/68.  May be due to dehydration.  No fever or leukocytosis.  No sepsis. -Fluid as above  Migraine -As needed Tylenol  -pt is on Emgality  inj 120 mg monthly for prophylaxis  Depression with anxiety -Continue home medications  Obesity (BMI 35.0-39.9 without comorbidity): Patient has Obesity Class I, with body weight 86.2 Kg and BMI 31.62 kg/m2.  - Encourage losing weight - Exercise and healthy diet - Patient is Zepbound  weekly        DVT ppx: SCd  Code Status: Full code   Family Communication:     not done, no family member is at bed side.      Disposition Plan:  may need rehab  Consults called:  Dr. Magdalen of podiatry  Admission status and Level of care: Telemetry:   as inpt        Dispo: The patient is from: Home              Anticipated d/c is to: May need rehab              Anticipated d/c date is: 2 days              Patient currently is not medically stable to d/c.    Severity of Illness:  The appropriate patient status for this patient is INPATIENT. Inpatient status is judged to be reasonable and necessary in order to provide the required intensity of service to ensure the patient's safety. The patient's presenting symptoms, physical exam findings, and initial radiographic and laboratory data in the context of their chronic comorbidities is felt to place them at high risk for further clinical deterioration. Furthermore, it is not anticipated that the patient will be medically stable for discharge from the hospital within 2 midnights of admission.   * I certify that at the point of admission it is my clinical judgment that the patient will require inpatient hospital care spanning beyond 2 midnights from the point of admission due to high intensity of service, high risk for further deterioration  and high frequency of surveillance required.*       Date of Service 12/19/2023    Caleb Exon [facility]   If 7PM-7AM, please contact night-coverage [email or web address] 12/19/2023, 2:41 AM

## 2023-12-18 NOTE — ED Provider Notes (Signed)
 [facility] Provider Note    Event Date/Time   First MD Initiated Contact with Patient 12/18/23 2336     (approximate)   History   Dizziness and Fall   HPI  Katie Neal is a 53 year old female with history of CRPS, POTS presenting to the emergency department for evaluation of dizziness and ankle pain.  Patient reports she has had dizziness multiple times in the past.  Today felt more like a spinning sensation.  She was being assisted by her son to the bathroom when she began to feel dizzy and fell hurting her left ankle.  Unsure if she hit her head.  No LOC.  Reports pain in her left ankle radiating throughout her leg.     Physical Exam   Triage Vital Signs: ED Triage Vitals [12/18/23 2007]  Encounter Vitals Group     BP (!) 79/58     Girls Systolic BP Percentile      Girls Diastolic BP Percentile      Boys Systolic BP Percentile      Boys Diastolic BP Percentile      Pulse Rate 71     Resp 15     Temp 97.9 F (36.6 C)     Temp Source Oral     SpO2 99 %     Weight      Height      Head Circumference      Peak Flow      Pain Score 9     Pain Loc      Pain Education      Exclude from Growth Chart     Most recent vital signs: Vitals:   12/18/23 2010 12/18/23 2259  BP: 97/65 108/68  Pulse:  74  Resp:  17  Temp:  97.8 F (36.6 C)  SpO2:  99%     Nursing notes and vital signs reviewed.  General: Adult female, lying in bed, awake interactive Head: Atraumatic Chest: Symmetric chest rise Cardiac: Regular rhythm and rate.  Respiratory: Lungs clear to auscultation Abdomen: Soft, nondistended.  MSK: Swelling with limited range of motion of the left ankle secondary to pain.  2+ DP pulses.  No deformity proximal to the left ankle or focal area of tenderness.  Compartments soft. Neuro: Alert, oriented. GCS 15 Skin: No evidence of burns or lacerations.   ED Results / Procedures / Treatments   Labs (all labs ordered are listed,  but only abnormal results are displayed) Labs Reviewed  COMPREHENSIVE METABOLIC PANEL WITH GFR - Abnormal; Notable for the following components:      Result Value   Potassium 3.1 (*)    Glucose, Bld 174 (*)    BUN <5 (*)    Creatinine, Ser 1.17 (*)    Calcium 8.6 (*)    Total Protein 5.8 (*)    GFR, Estimated 56 (*)    All other components within normal limits  CBG MONITORING, ED - Abnormal; Notable for the following components:   Glucose-Capillary 156 (*)    All other components within normal limits  CBC  VITAMIN D  25 HYDROXY (VIT D DEFICIENCY, FRACTURES)  TROPONIN T, HIGH SENSITIVITY     EKG EKG independently reviewed and interpreted by myself demonstrates:  EKG demonstrates normal sinus rhythm at rate 76, PR 154, cures 94, QTc 470, no acute ST changes  RADIOLOGY Imaging independently reviewed and interpreted by myself demonstrates:  Ankle x-Curby Carswell with trimalleolar fracture  Formal Radiology Read:  DG Ankle  Complete Left Result Date: 12/18/2023 EXAM: 3 OR MORE VIEW(S) XRAY OF THE LEFT ANKLE 12/18/2023 09:11:00 PM CLINICAL HISTORY: fall, ankle pain COMPARISON: None available. FINDINGS: BONES AND JOINTS: Oblique fractures of the distal fibula with fracture lines extending to the tibiofibular joint. Mild lateral displacement of distal fracture fragments. Fracture of the base of the medial malleolus extending to the articular surface. Mildly displaced coronal fracture of the posterior malleolus. Degenerative changes in the intertarsal joints. No joint dislocation. SOFT TISSUES: Soft tissue swelling. IMPRESSION: 1. Oblique distal fibular fracture with extension to the tibiofibular joint and mild lateral displacement of distal fragments. 2. Fracture of the base of the medial malleolus extending to the articular surface. 3. Mildly displaced coronal fracture of the posterior malleolus. 4. Soft tissue swelling. Electronically signed by: Elsie Gravely MD 12/18/2023 09:18 PM EST RP  Workstation: [id number]    PROCEDURES:  Critical Care performed: Yes, see critical care procedure note(s)  CRITICAL CARE Performed by: Nilsa Dade   Total critical care time: 31 minutes  Critical care time was exclusive of separately billable procedures and treating other patients.  Critical care was necessary to treat or prevent imminent or life-threatening deterioration.  Critical care was time spent personally by me on the following activities: development of treatment plan with patient and/or surrogate as well as nursing, discussions with consultants, evaluation of patient's response to treatment, examination of patient, obtaining history from patient or surrogate, ordering and performing treatments and interventions, ordering and review of laboratory studies, ordering and review of radiographic studies, pulse oximetry and re-evaluation of patient's condition.   Procedures   MEDICATIONS ORDERED IN ED: Medications  sodium chloride  0.9 % bolus 1,000 mL (0 mLs Intravenous Stopped 12/18/23 2340)  meclizine  (ANTIVERT ) tablet 25 mg (25 mg Oral Given 12/18/23 2108)  fentaNYL  (SUBLIMAZE ) injection 50 mcg (50 mcg Intravenous Given 12/18/23 2110)  fentaNYL  (SUBLIMAZE ) injection 50 mcg (50 mcg Intravenous Given 12/18/23 2218)  fentaNYL  (SUBLIMAZE ) injection 50 mcg (50 mcg Intravenous Given 12/18/23 2352)     IMPRESSION / MDM / ASSESSMENT AND PLAN / ED COURSE  I reviewed the triage vital signs and the nursing notes.  Differential diagnosis includes, but is not limited to, fracture, dislocation, soft tissue injury  Patient's presentation is most consistent with acute presentation with potential threat to life or bodily function.  53 year old female presenting after a fall in the setting of dizziness with left ankle pain.  Hypotensive with initial blood pressure, improved on repeat.  History of similar dizziness episodes, does see neurology for her dizziness.  Lab sent from triage with  reassuring CBC, CMP with mild hypokalemia, negative troponin.  EKG without acute ischemic findings.  X-Adelee Hannula does demonstrate trimalleolar fracture.  Case was discussed with Dr. Magdalen with podiatry.  He did note that patient could be splinted and discharged with nonweightbearing status for outpatient follow-up.  I did discuss patient's history of chronic pain and he did note that patient has uncontrolled pain can be admitted to hospitalist team and he will evaluate for possible inpatient operative intervention.  Patient initially stated that she preferred discharge home, but had significant pain and discomfort with splinting here.  She has concerns about ability to safely get around with her dizziness and nonweightbearing status of her left leg.  In the setting of this does prefer admission.  Discussed with Dr. Jeryl who is agreeable with this plan.  Will reach out to hospitalist team.  Clinical Course as of 12/18/23 2356  Wed Dec 18, 2023  2350  Case discussed with Dr. Hilma with hospitalist team.  He will evaluate for anticipated admission. [NR]    Clinical Course User Index [NR] Levander Slate, MD     FINAL CLINICAL IMPRESSION(S) / ED DIAGNOSES   Final diagnoses:  Closed trimalleolar fracture of left ankle, initial encounter  Dizziness     Rx / DC Orders   ED Discharge Orders     None        Note:  This document was prepared using Dragon voice recognition software and may include unintentional dictation errors.   Levander Slate, MD 12/18/23 [phone]

## 2023-12-18 NOTE — ED Notes (Signed)
 First Nurse Note-pt brought in via ems from home  pt tripped while walking with her walker and fell.  Pt has left knee and left ankle pain.,  iv in place.  Bp 79/48,p-86 oxygen sats 96%.  Pt in recliner in lobby.

## 2023-12-18 NOTE — ED Triage Notes (Signed)
 Pt reports recurrent dizziness this afternoon over the past 3 hours. Dizziness ongoing at rest. Sts she was being assisted by her son to the bathroom ambulating with walker when she became dizzy causing her to fall. No LOC. C/o left knee pain, left lower leg pain, left ankle pain, and left foot pain.  Per note pt hypotensive with EMS - see first nurse note.

## 2023-12-19 ENCOUNTER — Encounter: Payer: Self-pay | Admitting: Internal Medicine

## 2023-12-19 DIAGNOSIS — E876 Hypokalemia: Secondary | ICD-10-CM | POA: Diagnosis present

## 2023-12-19 DIAGNOSIS — I959 Hypotension, unspecified: Secondary | ICD-10-CM | POA: Diagnosis present

## 2023-12-19 DIAGNOSIS — W19XXXA Unspecified fall, initial encounter: Secondary | ICD-10-CM

## 2023-12-19 DIAGNOSIS — S82892A Other fracture of left lower leg, initial encounter for closed fracture: Secondary | ICD-10-CM

## 2023-12-19 DIAGNOSIS — S82852A Displaced trimalleolar fracture of left lower leg, initial encounter for closed fracture: Secondary | ICD-10-CM

## 2023-12-19 DIAGNOSIS — G43909 Migraine, unspecified, not intractable, without status migrainosus: Secondary | ICD-10-CM | POA: Diagnosis present

## 2023-12-19 DIAGNOSIS — F418 Other specified anxiety disorders: Secondary | ICD-10-CM | POA: Diagnosis present

## 2023-12-19 DIAGNOSIS — N179 Acute kidney failure, unspecified: Secondary | ICD-10-CM | POA: Diagnosis present

## 2023-12-19 DIAGNOSIS — G9050 Complex regional pain syndrome I, unspecified: Secondary | ICD-10-CM | POA: Diagnosis not present

## 2023-12-19 DIAGNOSIS — J4489 Other specified chronic obstructive pulmonary disease: Secondary | ICD-10-CM | POA: Diagnosis present

## 2023-12-19 LAB — TYPE AND SCREEN
ABO/RH(D): O POS
Antibody Screen: NEGATIVE

## 2023-12-19 LAB — BASIC METABOLIC PANEL WITH GFR
Anion gap: 10 (ref 5–15)
BUN: 5 mg/dL — ABNORMAL LOW (ref 6–20)
CO2: 24 mmol/L (ref 22–32)
Calcium: 8.8 mg/dL — ABNORMAL LOW (ref 8.9–10.3)
Chloride: 103 mmol/L (ref 98–111)
Creatinine, Ser: 0.93 mg/dL (ref 0.44–1.00)
GFR, Estimated: >60 mL/min (ref >60)
Glucose, Bld: 117 mg/dL — ABNORMAL HIGH (ref 70–99)
Potassium: 4.5 mmol/L (ref 3.5–5.1)
Sodium: 138 mmol/L (ref 135–145)

## 2023-12-19 LAB — VITAMIN D 25 HYDROXY (VIT D DEFICIENCY, FRACTURES): Vit D, 25-Hydroxy: 20.73 ng/mL — ABNORMAL LOW (ref 30–100)

## 2023-12-19 LAB — CBC
HCT: 37 % (ref 36.0–46.0)
Hemoglobin: 12.8 g/dL (ref 12.0–15.0)
MCH: 30 pg (ref 26.0–34.0)
MCHC: 34.6 g/dL (ref 30.0–36.0)
MCV: 86.7 fL (ref 80.0–100.0)
Platelets: 193 K/uL (ref 150–400)
RBC: 4.27 MIL/uL (ref 3.87–5.11)
RDW: 12.6 % (ref 11.5–15.5)
WBC: 6.9 K/uL (ref 4.0–10.5)
nRBC: 0 % (ref 0.0–0.2)

## 2023-12-19 LAB — PHOSPHORUS: Phosphorus: 3.6 mg/dL (ref 2.5–4.6)

## 2023-12-19 LAB — PROTIME-INR
INR: 1 (ref 0.8–1.2)
Prothrombin Time: 13.3 s (ref 11.4–15.2)

## 2023-12-19 LAB — HIV ANTIBODY (ROUTINE TESTING W REFLEX): HIV Screen 4th Generation wRfx: NONREACTIVE

## 2023-12-19 LAB — APTT: aPTT: 34 s (ref 24–36)

## 2023-12-19 LAB — MAGNESIUM: Magnesium: 2.1 mg/dL (ref 1.7–2.4)

## 2023-12-19 MED ORDER — DIPHENHYDRAMINE HCL 25 MG PO CAPS
25.0000 mg | ORAL_CAPSULE | Freq: Four times a day (QID) | ORAL | Status: DC | PRN
  Administered 2023-12-19: 25 mg via ORAL
  Filled 2023-12-19: qty 1

## 2023-12-19 MED ORDER — TIRZEPATIDE-WEIGHT MANAGEMENT 2.5 MG/0.5ML ~~LOC~~ SOAJ: 2.5000 mg | SUBCUTANEOUS | Status: DC

## 2023-12-19 MED ORDER — MONTELUKAST SODIUM 10 MG PO TABS
10.0000 mg | ORAL_TABLET | Freq: Every day | ORAL | Status: DC
  Administered 2023-12-19 – 2023-12-24 (×5): 10 mg via ORAL
  Filled 2023-12-19 (×5): qty 1

## 2023-12-19 MED ORDER — DICYCLOMINE HCL 20 MG PO TABS: 20.0000 mg | ORAL_TABLET | Freq: Three times a day (TID) | ORAL | Status: DC | PRN

## 2023-12-19 MED ORDER — FENTANYL CITRATE (PF) 50 MCG/ML IJ SOSY
50.0000 ug | PREFILLED_SYRINGE | INTRAMUSCULAR | Status: DC | PRN
  Administered 2023-12-19 – 2023-12-23 (×13): 50 ug via INTRAVENOUS
  Filled 2023-12-19 (×15): qty 1

## 2023-12-19 MED ORDER — POTASSIUM CHLORIDE CRYS ER 20 MEQ PO TBCR
40.0000 meq | EXTENDED_RELEASE_TABLET | Freq: Once | ORAL | Status: AC
  Administered 2023-12-19: 40 meq via ORAL
  Filled 2023-12-19: qty 2

## 2023-12-19 MED ORDER — ALBUTEROL SULFATE HFA 108 (90 BASE) MCG/ACT IN AERS: 2.0000 | INHALATION_SPRAY | RESPIRATORY_TRACT | Status: DC | PRN

## 2023-12-19 MED ORDER — SIMVASTATIN 20 MG PO TABS
10.0000 mg | ORAL_TABLET | Freq: Every day | ORAL | Status: DC
  Administered 2023-12-19 – 2023-12-23 (×5): 10 mg via ORAL
  Filled 2023-12-19 (×5): qty 1

## 2023-12-19 MED ORDER — UMECLIDINIUM BROMIDE 62.5 MCG/ACT IN AEPB
1.0000 | INHALATION_SPRAY | Freq: Every day | RESPIRATORY_TRACT | Status: DC
  Administered 2023-12-19 – 2023-12-24 (×6): 1 via RESPIRATORY_TRACT
  Filled 2023-12-19: qty 7

## 2023-12-19 MED ORDER — TIRZEPATIDE-WEIGHT MANAGEMENT 5 MG/0.5ML ~~LOC~~ SOAJ: 5.0000 mg | SUBCUTANEOUS | Status: DC

## 2023-12-19 MED ORDER — LAMOTRIGINE 100 MG PO TABS
100.0000 mg | ORAL_TABLET | Freq: Every day | ORAL | Status: DC
  Administered 2023-12-19 – 2023-12-24 (×5): 100 mg via ORAL
  Filled 2023-12-19 (×2): qty 4
  Filled 2023-12-19 (×4): qty 1
  Filled 2023-12-19 (×2): qty 4
  Filled 2023-12-19: qty 1
  Filled 2023-12-19: qty 4
  Filled 2023-12-19: qty 1

## 2023-12-19 MED ORDER — PANTOPRAZOLE SODIUM 40 MG PO TBEC
40.0000 mg | DELAYED_RELEASE_TABLET | Freq: Every day | ORAL | Status: DC
  Administered 2023-12-19 – 2023-12-24 (×5): 40 mg via ORAL
  Filled 2023-12-19 (×5): qty 1

## 2023-12-19 MED ORDER — DOCUSATE SODIUM 50 MG/5ML PO LIQD
50.0000 mg | Freq: Every day | ORAL | Status: DC
  Administered 2023-12-19 – 2023-12-23 (×5): 50 mg via ORAL
  Filled 2023-12-19 (×5): qty 10

## 2023-12-19 MED ORDER — CETIRIZINE HCL 10 MG PO TABS
10.0000 mg | ORAL_TABLET | Freq: Every evening | ORAL | Status: DC
  Administered 2023-12-19 – 2023-12-23 (×5): 10 mg via ORAL
  Filled 2023-12-19 (×6): qty 1

## 2023-12-19 MED ORDER — ACETAMINOPHEN 325 MG PO TABS: 650.0000 mg | ORAL_TABLET | Freq: Four times a day (QID) | ORAL | Status: DC | PRN

## 2023-12-19 MED ORDER — OLOPATADINE HCL 0.1 % OP SOLN: 1.0000 [drp] | Freq: Two times a day (BID) | OPHTHALMIC | Status: DC | PRN

## 2023-12-19 MED ORDER — LAMOTRIGINE 100 MG PO TABS
200.0000 mg | ORAL_TABLET | Freq: Every day | ORAL | Status: DC
  Administered 2023-12-19 – 2023-12-23 (×5): 200 mg via ORAL
  Filled 2023-12-19 (×2): qty 2
  Filled 2023-12-19 (×2): qty 8
  Filled 2023-12-19: qty 2
  Filled 2023-12-19 (×3): qty 8
  Filled 2023-12-19 (×2): qty 2

## 2023-12-19 MED ORDER — TRAZODONE HCL 100 MG PO TABS
200.0000 mg | ORAL_TABLET | Freq: Every day | ORAL | Status: DC
  Administered 2023-12-19 – 2023-12-23 (×6): 200 mg via ORAL
  Filled 2023-12-19 (×6): qty 2

## 2023-12-19 MED ORDER — AZITHROMYCIN 250 MG PO TABS
250.0000 mg | ORAL_TABLET | Freq: Once | ORAL | Status: AC
  Administered 2023-12-19: 250 mg via ORAL
  Filled 2023-12-19: qty 1

## 2023-12-19 MED ORDER — FLUTICASONE FUROATE-VILANTEROL 100-25 MCG/ACT IN AEPB
1.0000 | INHALATION_SPRAY | Freq: Every day | RESPIRATORY_TRACT | Status: DC
  Administered 2023-12-19 – 2023-12-24 (×6): 1 via RESPIRATORY_TRACT
  Filled 2023-12-19: qty 28

## 2023-12-19 MED ORDER — DOXEPIN HCL 10 MG PO CAPS
10.0000 mg | ORAL_CAPSULE | Freq: Every day | ORAL | Status: DC
  Administered 2023-12-19 – 2023-12-23 (×6): 10 mg via ORAL
  Filled 2023-12-19 (×6): qty 1

## 2023-12-19 MED ORDER — ONDANSETRON HCL 4 MG/2ML IJ SOLN
4.0000 mg | Freq: Three times a day (TID) | INTRAMUSCULAR | Status: DC | PRN
  Administered 2023-12-19 – 2023-12-21 (×2): 4 mg via INTRAVENOUS
  Filled 2023-12-19 (×2): qty 2

## 2023-12-19 MED ORDER — DICYCLOMINE HCL 20 MG PO TABS
20.0000 mg | ORAL_TABLET | Freq: Three times a day (TID) | ORAL | Status: DC
  Administered 2023-12-19 – 2023-12-24 (×14): 20 mg via ORAL
  Filled 2023-12-19 (×19): qty 1

## 2023-12-19 MED ORDER — TIOTROPIUM BROMIDE MONOHYDRATE 18 MCG IN CAPS: 18.0000 ug | ORAL_CAPSULE | Freq: Every day | RESPIRATORY_TRACT | Status: DC

## 2023-12-19 MED ORDER — MORPHINE SULFATE (PF) 4 MG/ML IV SOLN: 3.0000 mg | INTRAVENOUS | Status: DC | PRN

## 2023-12-19 MED ORDER — GABAPENTIN 100 MG PO CAPS
100.0000 mg | ORAL_CAPSULE | Freq: Three times a day (TID) | ORAL | Status: DC
  Administered 2023-12-19 – 2023-12-24 (×15): 100 mg via ORAL
  Filled 2023-12-19 (×15): qty 1

## 2023-12-19 MED ORDER — DOCUSATE SODIUM 50 MG PO CAPS: 50.0000 mg | ORAL_CAPSULE | Freq: Every day | ORAL | Status: DC

## 2023-12-19 MED ORDER — QUETIAPINE FUMARATE 300 MG PO TABS
300.0000 mg | ORAL_TABLET | Freq: Every day | ORAL | Status: DC
  Administered 2023-12-19 – 2023-12-23 (×6): 300 mg via ORAL
  Filled 2023-12-19 (×6): qty 1

## 2023-12-19 MED ORDER — LORAZEPAM 2 MG/ML IJ SOLN: 2.0000 mg | INTRAMUSCULAR | Status: DC | PRN

## 2023-12-19 MED ORDER — OXYCODONE-ACETAMINOPHEN 5-325 MG PO TABS
2.0000 | ORAL_TABLET | ORAL | Status: DC | PRN
  Administered 2023-12-19 – 2023-12-21 (×3): 2 via ORAL
  Filled 2023-12-19 (×3): qty 2

## 2023-12-19 MED ORDER — SODIUM CHLORIDE 0.9 % IV SOLN: INTRAVENOUS | Status: DC

## 2023-12-19 MED ORDER — FENTANYL CITRATE (PF) 50 MCG/ML IJ SOSY
50.0000 ug | PREFILLED_SYRINGE | INTRAMUSCULAR | Status: DC | PRN
Start: 2023-12-19 — End: 2023-12-19
  Administered 2023-12-19 (×3): 50 ug via INTRAVENOUS
  Filled 2023-12-19 (×3): qty 1

## 2023-12-19 MED ORDER — LEVOTHYROXINE SODIUM 137 MCG PO TABS
137.0000 ug | ORAL_TABLET | Freq: Every day | ORAL | Status: DC
  Administered 2023-12-19 – 2023-12-24 (×6): 137 ug via ORAL
  Filled 2023-12-19 (×6): qty 1

## 2023-12-19 MED ORDER — LINACLOTIDE 145 MCG PO CAPS
290.0000 ug | ORAL_CAPSULE | Freq: Every day | ORAL | Status: DC
  Administered 2023-12-19 – 2023-12-23 (×5): 290 ug via ORAL
  Filled 2023-12-19 (×5): qty 2

## 2023-12-19 MED ORDER — MORPHINE SULFATE (PF) 2 MG/ML IV SOLN: 1.0000 mg | INTRAVENOUS | Status: DC | PRN

## 2023-12-19 MED ORDER — PREDNISONE 20 MG PO TABS
20.0000 mg | ORAL_TABLET | Freq: Every day | ORAL | Status: DC
  Administered 2023-12-19 – 2023-12-24 (×5): 20 mg via ORAL
  Filled 2023-12-19 (×5): qty 1

## 2023-12-19 MED ORDER — LAMOTRIGINE 25 MG PO TABS: 100.0000 mg | ORAL_TABLET | ORAL | Status: DC

## 2023-12-19 MED ORDER — AMITRIPTYLINE HCL 25 MG PO TABS
25.0000 mg | ORAL_TABLET | Freq: Every day | ORAL | Status: DC
  Administered 2023-12-19 – 2023-12-23 (×6): 25 mg via ORAL
  Filled 2023-12-19 (×6): qty 1

## 2023-12-19 MED ORDER — DM-GUAIFENESIN ER 30-600 MG PO TB12: 1.0000 | ORAL_TABLET | Freq: Two times a day (BID) | ORAL | Status: DC | PRN

## 2023-12-19 MED ORDER — VENLAFAXINE HCL ER 75 MG PO CP24
225.0000 mg | ORAL_CAPSULE | Freq: Every day | ORAL | Status: DC
  Administered 2023-12-19 – 2023-12-24 (×5): 225 mg via ORAL
  Filled 2023-12-19 (×6): qty 3

## 2023-12-19 MED ORDER — FLUTICASONE PROPIONATE 50 MCG/ACT NA SUSP: 2.0000 | Freq: Every day | NASAL | Status: DC | PRN

## 2023-12-19 MED ORDER — ALBUTEROL SULFATE (2.5 MG/3ML) 0.083% IN NEBU: 2.5000 mg | INHALATION_SOLUTION | RESPIRATORY_TRACT | Status: DC | PRN

## 2023-12-19 MED ORDER — LEVORPHANOL TARTRATE 2 MG PO TABS: 2.0000 mg | ORAL_TABLET | Freq: Three times a day (TID) | ORAL | Status: DC | PRN

## 2023-12-19 MED ORDER — TIZANIDINE HCL 4 MG PO TABS: 4.0000 mg | ORAL_TABLET | Freq: Three times a day (TID) | ORAL | Status: DC | PRN

## 2023-12-19 MED ORDER — GALCANEZUMAB-GNLM 120 MG/ML ~~LOC~~ SOAJ: 120.0000 mg | SUBCUTANEOUS | Status: DC

## 2023-12-19 MED ORDER — ADULT MULTIVITAMIN W/MINERALS CH
1.0000 | ORAL_TABLET | Freq: Every day | ORAL | Status: DC
  Administered 2023-12-19 – 2023-12-24 (×5): 1 via ORAL
  Filled 2023-12-19 (×5): qty 1

## 2023-12-19 NOTE — Plan of Care (Signed)

## 2023-12-19 NOTE — Consult Note (Signed)
 PODIATRY CONSULTATION  NAME Katie Neal MRN [id number] DOB Apr 15, 1970 DOA 12/18/2023   Reason for consult:  Chief Complaint  Patient presents with   Dizziness   Fall    Attending/Consulting physician: Dr. Hilma, MD  Foot and ankle history of present illness: 53 y.o. female presented to the emergency department yesterday, 12/18/2023 after experiencing a fall.  She does relate to regular dizziness and lightheadedness due to CRPS.  Upon arrival to the emergency department, she had imaging that demonstrated a trimalleolar ankle fracture to her left ankle.  There was no dislocation.  Closed injury.  This injury occurred from a low level fall.  No loss of consciousness.  Per Dr. Hilma: HPI: Katie Neal is a 53 y.o. female with medical history significant of migraine headaches, CRPS (complex regional pain syndrome type I, involving internal organs including bladder), hyperlipidemia, asthma, GERD, hypothyroidism, depression with anxiety, PTSD, IBS, , insomnia, seizure, who presents with Dizziness, fall, left ankle pain.   Pt states that due to hx of CRPS, she has intermittent dizziness. Today she was being assisted by her son to the bathroom when she began to feel dizzy and fell, hurting her left ankle.  Unsure if she hit her head.  No LOC.  She developed the pain in the left ankle, which is constant, sharp, severe, nonradiating, aggravated by movement.  Patient has nausea, no vomiting, diarrhea or abdominal pain.  No symptoms of UTI. Patient states that because of SOB and wheezing, her doctor put her on prednisone  and Z-Pak since yesterday.  She still has mild SOB and dry cough, no chest pain, fever or chills.   Patient was initially hypotensive with blood pressure 79/58, which improved to 108/68 after giving 1 L normal saline in ED.  Patient states that she is taking metoprolol  for tachycardia at home.  Past Medical History:  Diagnosis Date   Arthritis    Asthma    Cardiomegaly     Cerebral atrophy    Complex regional pain syndrome type 1    COPD (chronic obstructive pulmonary disease) (HCC)    CRPS (complex regional pain syndrome type I)    Depression    Diaphragmatic hernia    Dyspnea    Excessive falling    Headache    History of chronic sinusitis    History of kidney stones    Hyperlipidemia    Hypothyroidism    IBS (irritable bowel syndrome)    Insomnia    Memory difficulty    Osteoarthritis of knee    Pneumonia    Post concussion syndrome    PTSD (post-traumatic stress disorder)    Seizure-like activity (HCC)    Sleep apnea    not on Cpap machine but is on oxygen    Urinary incontinence    Vasovagal syncope        Latest Ref Rng & Units 12/19/2023    4:53 AM 12/18/2023    8:13 PM 12/16/2023    2:32 PM  CBC  WBC 4.0 - 10.5 K/uL 6.9  9.1  6.3   Hemoglobin 12.0 - 15.0 g/dL 87.1  87.0  86.0   Hematocrit 36.0 - 46.0 % 37.0  38.2  40.7   Platelets 150 - 400 K/uL 193  187  179        Latest Ref Rng & Units 12/19/2023    4:53 AM 12/18/2023    8:13 PM 12/16/2023    2:32 PM  BMP  Glucose 70 - 99 mg/dL 882  174  161   BUN 6 - 20 mg/dL 5  <5  6   Creatinine 9.55 - 1.00 mg/dL 9.06  8.82  9.11   Sodium 135 - 145 mmol/L 138  136  139   Potassium 3.5 - 5.1 mmol/L 4.5  3.1  3.1   Chloride 98 - 111 mmol/L 103  100  100   CO2 22 - 32 mmol/L 24  26  29    Calcium 8.9 - 10.3 mg/dL 8.8  8.6  9.0       Physical Exam: Lower Extremity Exam Vasc:  L - PT faintly palpable, DP faintly palpable, likely secondary to edema. Cap refill <3 sec to digits  Derm:   Left ankle skin intact, closed injury.  Moderate ecchymosis.  Moderate edema.  No fracture blisters.  MSK:  Left ankle: No gross deformities, significant pain to palpation circumferentially around the ankle and with manipulation.  Remainder of musculoskeletal exam deferred.  Neuro:    L - Gross sensation intact, borderline hypersensitive. Gross motor function intact    ASSESSMENT/PLAN OF  CARE 53 y.o. female with PMHx significant for CRPS, hyperlipiedemia, asthma, GERD, hypothyroidism, seizure, dizziness with displaced closed left ankle trimalleolar fracture.  Initially the plan was for the patient to follow-up in clinic as an outpatient, but she struggled with pain control secondary to CRPS.  Her pain is now better controlled on fentanyl  every 3 hours.  Trimalleolar left ankle fracture -Patient had an extended discussion about her left trimalleolar ankle fracture.  We discussed that due to multiple breaks in the ankle, inherent instability with subsequent arthritis development would likely result if nonoperative treatment is pursued.  Discussed recommendation for ankle ORIF which she expresses understanding of.  Plan for left ankle ORIF tomorrow, approximate 11 AM. -During surgeries in the past, patient has had success utilizing ketamine  in the peri-operative period with her history of CRPS. Defer to primary care on this.  -Patient has been on short-term oral steroid for wheezing.  She states at the last evaluation she was not wheezing any longer.  Reduction of steroid would improve healing potential. -N.p.o. tonight at midnight.  Order for consent placed. - Anticoagulation: Hold - WB status: Nonweightbearing - Appreciate surgical clearance per primary team - Will continue to follow  Informed surgical risk consent was discussed and will be completed tomorrow.  I have discussed my findings with the patient in great detail.  I have discussed all risks including but not limited to infection, stiffness, scarring, limp, disability, deformity, damage to blood vessels and nerves, numbness, poor healing, need for braces, arthritis, chronic pain, amputation, and death.  All benefits and realistic expectations discussed in great detail.  I have made no promises as to the outcome.  I have provided realistic expectations.  I have offered the patient a 2nd opinion, which they have declined and  assured me they preferred to proceed despite the risks.  Planned procedures: Left ankle open reduction internal fixation for trimalleolar fracture  Post-operative weight bearing status: NWB in CAM walker for 4 weeks  VTE risk assessment/need for prophylaxis:  Aspirin  81mg  BID x 21 days post operatively  Post discharge post-operative pain management: Oxycodone /Acetaminophen  10mg /325mg  q6h PRN  Patient risk factors that were discussed: CRPS, recent use of oral steroid, history of dizziness    Thank you for the consult.  Please contact me directly with any questions or concerns.           Prentice Ovens, DPM [facility] /  [facility]    [address] [place], [name] [address]                Office [phone]  Fax [phone]

## 2023-12-19 NOTE — Progress Notes (Signed)
 PROGRESS NOTE    Katie Neal  FMW:[id number] DOB: [birth year] DOA: 12/18/2023 PCP: [facility]   Assessment & Plan:   Principal Problem:   Closed left ankle fracture Active Problems:   CRPS (complex regional pain syndrome type I)   Fall at home, initial encounter   COPD with asthma (HCC)   HLD (hyperlipidemia)   Hypothyroidism   Seizure (HCC)   Hypokalemia   IBS (irritable bowel syndrome)   AKI (acute kidney injury)   Hypotension   Migraine   Depression with anxiety   Obesity (BMI 35.0-39.9 without comorbidity)  Assessment and Plan: Closed left ankle fracture: CT-left ankle showed Trimalleolar left ankle fracture. Will go for surg tomorrow. Continue on home dose of levorphanol. Percocet, fentanyl  prn for pain as pt has chronic pain and takes pain meds daily. Podiatry following and recs apprec  Fall: will consult PT/OT when appropriate    Complex regional pain syndrome: continue on home dose of gabapentin , levorphanol     COPD with asthma: w/o exacerbation. Unknown stage and/or severity. Continue on home prednisone . Bronchodilators prn    HLD: continue on statin  Hypothyroidism: continue on levothyroxine     Seizure: continue on home dose of lamictal . Ativan  prn for seizures   Hypokalemia: WNL today   IBS: bentyl  prn    AKI: Cr is trending down from day prior   Hypotension: resolved   Migraine: on emgality  qmonthly    Depression: severity unknown. Continue on home dose of venlafaxine .    Obesity: BMI 31.6. Would benefit from weight loss. On Zepbound  already          DVT prophylaxis: SCDs Code Status: full  Family Communication:  Disposition Plan: depends on PT/OT recs (not consulted yet)  Level of care: Telemetry Consultants:  podiatry  Procedures:   Antimicrobials:    Subjective: Pt c/o ankle pain   Objective: Vitals:   12/18/23 2259 12/19/23 0101 12/19/23 0447 12/19/23 0805  BP: 108/68 106/78 125/82 137/81  Pulse: 74 81  74 72  Resp: 17 18 18 19   Temp: 97.8 F (36.6 C) 98.8 F (37.1 C) 97.6 F (36.4 C) 97.7 F (36.5 C)  TempSrc:  Oral    SpO2: 99% 93% 99% 100%  Height:  5' 5 (1.651 m)      Intake/Output Summary (Last 24 hours) at 12/19/2023 0936 Last data filed at 12/19/2023 9491 Gross per 24 hour  Intake 1189.97 ml  Output 1400 ml  Net -210.03 ml   There were no vitals filed for this visit.  Examination:  General exam: Appears calm and comfortable  Respiratory system: Clear to auscultation. Respiratory effort normal. Cardiovascular system: S1 & S2 +. No rubs, gallops or clicks.  Gastrointestinal system: Abdomen is obese, soft and nontender. Normal bowel sounds heard. Central nervous system: Alert and oriented. Moves all extremities Psychiatry: Judgement and insight appear normal. Mood & affect appropriate.     Data Reviewed: I have personally reviewed following labs and imaging studies  CBC: Recent Labs  Lab 12/16/23 1432 12/18/23 2013 12/19/23 0453  WBC 6.3 9.1 6.9  HGB 13.9 12.9 12.8  HCT 40.7 38.2 37.0  MCV 87.2 88.2 86.7  PLT 179 187 193   Basic Metabolic Panel: Recent Labs  Lab 12/16/23 1432 12/18/23 2013 12/19/23 0453  NA 139 136 138  K 3.1* 3.1* 4.5  CL 100 100 103  CO2 29 26 24   GLUCOSE 161* 174* 117*  BUN 6 <5* 5*  CREATININE 0.88 1.17* 0.93  CALCIUM 9.0  8.6* 8.8*  MG  --   --  2.1  PHOS  --   --  3.6   GFR: Estimated Creatinine Clearance: 76.7 mL/min (by C-G formula based on SCr of 0.93 mg/dL). Liver Function Tests: Recent Labs  Lab 12/16/23 1432 12/18/23 2013  AST 16 16  ALT 13 12  ALKPHOS 87 78  BILITOT 0.8 0.6  PROT 6.4* 5.8*  ALBUMIN 4.3 4.0   No results for input(s): LIPASE, AMYLASE in the last 168 hours. No results for input(s): AMMONIA in the last 168 hours. Coagulation Profile: Recent Labs  Lab 12/19/23 0453  INR 1.0   Cardiac Enzymes: No results for input(s): CKTOTAL, CKMB, CKMBINDEX, TROPONINI in the last 168  hours. BNP (last 3 results) No results for input(s): PROBNP in the last 8760 hours. HbA1C: No results for input(s): HGBA1C in the last 72 hours. CBG: Recent Labs  Lab 12/18/23 2017  GLUCAP 156*   Lipid Profile: No results for input(s): CHOL, HDL, LDLCALC, TRIG, CHOLHDL, LDLDIRECT in the last 72 hours. Thyroid  Function Tests: No results for input(s): TSH, T4TOTAL, FREET4, T3FREE, THYROIDAB in the last 72 hours. Anemia Panel: No results for input(s): VITAMINB12, FOLATE, FERRITIN, TIBC, IRON, RETICCTPCT in the last 72 hours. Sepsis Labs: No results for input(s): PROCALCITON, LATICACIDVEN in the last 168 hours.  No results found for this or any previous visit (from the past 240 hours).       Radiology Studies: CT HEAD WO CONTRAST ( ) Result Date: 12/19/2023 EXAM: CT HEAD WITHOUT CONTRAST 12/19/2023 12:45:06 AM TECHNIQUE: CT of the head was performed without the administration of intravenous contrast. Automated exposure control, iterative reconstruction, and/or weight based adjustment of the mA/kV was utilized to reduce the radiation dose to as low as reasonably achievable. COMPARISON: 04/05/2023 CLINICAL HISTORY: Head trauma, moderate-severe. FINDINGS: BRAIN AND VENTRICLES: No acute hemorrhage. No evidence of acute infarct. No hydrocephalus. No extra-axial collection. No mass effect or midline shift. ORBITS: No acute abnormality. SINUSES: No acute abnormality. SOFT TISSUES AND SKULL: No acute soft tissue abnormality. No skull fracture. IMPRESSION: 1. No acute intracranial abnormality. Electronically signed by: Franky Crease MD 12/19/2023 12:50 AM EST RP Workstation: [id number]   CT ANKLE LEFT WO CONTRAST Result Date: 12/19/2023 EXAM: CT LEFT ANKLE, WITHOUT IV CONTRAST 12/19/2023 12:45:06 AM TECHNIQUE: Axial images were acquired through the left ankle without IV contrast. Reformatted images were reviewed. Automated exposure control, iterative  reconstruction, and/or weight based adjustment of the mA/kV was utilized to reduce the radiation dose to as low as reasonably achievable. COMPARISON: Plain films 12/18/2023. CLINICAL HISTORY: Ankle trauma, fracture, xray done (Age >= 5y). FINDINGS: BONES: Oblique fractures noted through the distal fibula at the level of the ankle mortise and at the base of the medial malleolus. Coronal fracture through the posterior malleolus with slight displacement. JOINTS: No subluxation or dislocation. The joint spaces are normal. SOFT TISSUES: The soft tissues are unremarkable. IMPRESSION: 1. Trimalleolar left ankle fracture. Electronically signed by: Franky Crease MD 12/19/2023 12:49 AM EST RP Workstation: [id number]   DG Ankle Complete Left Result Date: 12/18/2023 EXAM: 3 OR MORE VIEW(S) XRAY OF THE LEFT ANKLE 12/18/2023 09:11:00 PM CLINICAL HISTORY: fall, ankle pain COMPARISON: None available. FINDINGS: BONES AND JOINTS: Oblique fractures of the distal fibula with fracture lines extending to the tibiofibular joint. Mild lateral displacement of distal fracture fragments. Fracture of the base of the medial malleolus extending to the articular surface. Mildly displaced coronal fracture of the posterior malleolus. Degenerative changes in the intertarsal joints. No joint  dislocation. SOFT TISSUES: Soft tissue swelling. IMPRESSION: 1. Oblique distal fibular fracture with extension to the tibiofibular joint and mild lateral displacement of distal fragments. 2. Fracture of the base of the medial malleolus extending to the articular surface. 3. Mildly displaced coronal fracture of the posterior malleolus. 4. Soft tissue swelling. Electronically signed by: Elsie Gravely MD 12/18/2023 09:18 PM EST RP Workstation: [id number]        Scheduled Meds:  amitriptyline   25 mg Oral QHS   cetirizine   10 mg Oral QPM   dicyclomine   20 mg Oral TID AC   docusate  50 mg Oral QHS   doxepin   10 mg Oral QHS   fluticasone   furoate-vilanterol  1 puff Inhalation Daily   gabapentin   100 mg Oral TID   Galcanezumab -gnlm  120 mg Subcutaneous Q30 days   lamoTRIgine   100 mg Oral Daily   And   lamoTRIgine   200 mg Oral QHS   levothyroxine   137 mcg Oral Q0600   linaclotide   290 mcg Oral QHS   montelukast   10 mg Oral Daily   multivitamin with minerals  1 tablet Oral Daily   pantoprazole   40 mg Oral Daily   predniSONE   20 mg Oral Daily   QUEtiapine   300 mg Oral QHS   simvastatin   10 mg Oral QHS   traZODone   200 mg Oral QHS   umeclidinium bromide   1 puff Inhalation Daily   venlafaxine  XR  225 mg Oral Q breakfast   Continuous Infusions:  sodium chloride  75 mL/hr at 12/19/23 0455     LOS: 0 days      Anthony CHRISTELLA Pouch, MD [facility] Pager [phone]  If 7PM-7AM, please contact night-coverage [email or web address] 12/19/2023, 9:36 AM

## 2023-12-20 ENCOUNTER — Inpatient Hospital Stay

## 2023-12-20 ENCOUNTER — Inpatient Hospital Stay: Admitting: Anesthesiology

## 2023-12-20 ENCOUNTER — Telehealth (HOSPITAL_COMMUNITY): Payer: Self-pay | Admitting: Pharmacy Technician

## 2023-12-20 ENCOUNTER — Encounter
Admission: EM | Disposition: A | Discharge status: Skilled Nursing Facility | Payer: Self-pay | Source: Home / Self Care | Attending: Internal Medicine

## 2023-12-20 ENCOUNTER — Other Ambulatory Visit (HOSPITAL_COMMUNITY): Payer: Self-pay

## 2023-12-20 DIAGNOSIS — S82852A Displaced trimalleolar fracture of left lower leg, initial encounter for closed fracture: Secondary | ICD-10-CM | POA: Diagnosis not present

## 2023-12-20 DIAGNOSIS — S82892A Other fracture of left lower leg, initial encounter for closed fracture: Secondary | ICD-10-CM | POA: Diagnosis not present

## 2023-12-20 HISTORY — PX: ORIF ANKLE FRACTURE: SHX5408

## 2023-12-20 LAB — BASIC METABOLIC PANEL WITH GFR
Anion gap: 14 (ref 5–15)
BUN: 6 mg/dL (ref 6–20)
CO2: 21 mmol/L — ABNORMAL LOW (ref 22–32)
Calcium: 9 mg/dL (ref 8.9–10.3)
Chloride: 105 mmol/L (ref 98–111)
Creatinine, Ser: 0.78 mg/dL (ref 0.44–1.00)
GFR, Estimated: >60 mL/min (ref >60)
Glucose, Bld: 122 mg/dL — ABNORMAL HIGH (ref 70–99)
Potassium: 3.8 mmol/L (ref 3.5–5.1)
Sodium: 141 mmol/L (ref 135–145)

## 2023-12-20 SURGERY — OPEN REDUCTION INTERNAL FIXATION (ORIF) ANKLE FRACTURE
Anesthesia: General | Site: Ankle | Laterality: Left

## 2023-12-20 MED ORDER — 0.9 % SODIUM CHLORIDE (POUR BTL) OPTIME
TOPICAL | Status: DC | PRN
  Administered 2023-12-20: 500 mL

## 2023-12-20 MED ORDER — SODIUM CHLORIDE 0.9 % IV SOLN: 250.0000 mL | INTRAVENOUS | Status: DC | PRN

## 2023-12-20 MED ORDER — OXYCODONE HCL 5 MG PO TABS: 5.0000 mg | ORAL_TABLET | Freq: Once | ORAL | Status: DC | PRN

## 2023-12-20 MED ORDER — DEXAMETHASONE SOD PHOSPHATE PF 10 MG/ML IJ SOLN
INTRAMUSCULAR | Status: DC | PRN
  Administered 2023-12-20: 10 mg

## 2023-12-20 MED ORDER — LIDOCAINE HCL (PF) 1 % IJ SOLN
INTRAMUSCULAR | Status: AC
  Filled 2023-12-20: qty 5

## 2023-12-20 MED ORDER — CEFAZOLIN SODIUM-DEXTROSE 2-4 GM/100ML-% IV SOLN
2.0000 g | Freq: Once | INTRAVENOUS | Status: AC
  Administered 2023-12-20: 2 g via INTRAVENOUS

## 2023-12-20 MED ORDER — PROPOFOL 10 MG/ML IV BOLUS
INTRAVENOUS | Status: AC
  Filled 2023-12-20: qty 20

## 2023-12-20 MED ORDER — PROPOFOL 1000 MG/100ML IV EMUL
INTRAVENOUS | Status: AC
  Filled 2023-12-20: qty 100

## 2023-12-20 MED ORDER — MIDAZOLAM HCL 2 MG/2ML IJ SOLN
INTRAMUSCULAR | Status: AC
  Filled 2023-12-20: qty 2

## 2023-12-20 MED ORDER — MIDAZOLAM HCL (PF) 2 MG/2ML IJ SOLN
INTRAMUSCULAR | Status: DC | PRN
  Administered 2023-12-20: 1 mg via INTRAVENOUS

## 2023-12-20 MED ORDER — SODIUM CHLORIDE 0.9% FLUSH
3.0000 mL | Freq: Two times a day (BID) | INTRAVENOUS | Status: DC
  Administered 2023-12-21 – 2023-12-23 (×4): 3 mL via INTRAVENOUS

## 2023-12-20 MED ORDER — BUPIVACAINE HCL (PF) 0.5 % IJ SOLN
INTRAMUSCULAR | Status: AC
  Filled 2023-12-20: qty 20

## 2023-12-20 MED ORDER — CHLORHEXIDINE GLUCONATE 0.12 % MT SOLN
15.0000 mL | Freq: Once | OROMUCOSAL | Status: AC
  Administered 2023-12-20: 15 mL via OROMUCOSAL

## 2023-12-20 MED ORDER — BUPIVACAINE LIPOSOME 1.3 % IJ SUSP
INTRAMUSCULAR | Status: AC
  Filled 2023-12-20: qty 20

## 2023-12-20 MED ORDER — ACETAMINOPHEN 10 MG/ML IV SOLN: 1000.0000 mg | Freq: Once | INTRAVENOUS | Status: DC | PRN

## 2023-12-20 MED ORDER — BUPIVACAINE HCL (PF) 0.25 % IJ SOLN
INTRAMUSCULAR | Status: DC | PRN
  Administered 2023-12-20: 19 mL

## 2023-12-20 MED ORDER — FENTANYL CITRATE (PF) 50 MCG/ML IJ SOSY
PREFILLED_SYRINGE | INTRAMUSCULAR | Status: AC
  Filled 2023-12-20: qty 1

## 2023-12-20 MED ORDER — LIDOCAINE HCL (CARDIAC) PF 100 MG/5ML IV SOSY
PREFILLED_SYRINGE | INTRAVENOUS | Status: DC | PRN
  Administered 2023-12-20: 80 mg via INTRAVENOUS

## 2023-12-20 MED ORDER — DEXAMETHASONE SOD PHOSPHATE PF 10 MG/ML IJ SOLN
INTRAMUSCULAR | Status: DC | PRN
  Administered 2023-12-20: 5 mg via INTRAVENOUS

## 2023-12-20 MED ORDER — KETAMINE HCL 50 MG/5ML IJ SOSY
PREFILLED_SYRINGE | INTRAMUSCULAR | Status: AC
  Filled 2023-12-20: qty 5

## 2023-12-20 MED ORDER — FENTANYL CITRATE (PF) 100 MCG/2ML IJ SOLN
INTRAMUSCULAR | Status: DC | PRN
  Administered 2023-12-20 (×2): 50 ug via INTRAVENOUS

## 2023-12-20 MED ORDER — KETAMINE HCL 50 MG/5ML IJ SOSY
PREFILLED_SYRINGE | INTRAMUSCULAR | Status: DC | PRN
  Administered 2023-12-20: 50 mg via INTRAVENOUS

## 2023-12-20 MED ORDER — PHENYLEPHRINE 80 MCG/ML (10ML) SYRINGE FOR IV PUSH (FOR BLOOD PRESSURE SUPPORT)
PREFILLED_SYRINGE | INTRAVENOUS | Status: AC
  Filled 2023-12-20: qty 10

## 2023-12-20 MED ORDER — OXYCODONE-ACETAMINOPHEN 7.5-325 MG PO TABS: 1.0000 | ORAL_TABLET | ORAL | 0 refills | Status: DC | PRN

## 2023-12-20 MED ORDER — SUCCINYLCHOLINE CHLORIDE 200 MG/10ML IV SOSY
PREFILLED_SYRINGE | INTRAVENOUS | Status: DC | PRN
  Administered 2023-12-20: 100 mg via INTRAVENOUS

## 2023-12-20 MED ORDER — BUPIVACAINE LIPOSOME 1.3 % IJ SUSP
INTRAMUSCULAR | Status: DC | PRN
  Administered 2023-12-20 (×2): 10 mL via PERINEURAL

## 2023-12-20 MED ORDER — SODIUM CHLORIDE 0.9% FLUSH: 3.0000 mL | INTRAVENOUS | Status: DC | PRN

## 2023-12-20 MED ORDER — FAMOTIDINE 20 MG PO TABS
20.0000 mg | ORAL_TABLET | Freq: Two times a day (BID) | ORAL | Status: DC | PRN
  Administered 2023-12-20 – 2023-12-21 (×2): 20 mg via ORAL
  Filled 2023-12-20 (×2): qty 1

## 2023-12-20 MED ORDER — FENTANYL CITRATE (PF) 100 MCG/2ML IJ SOLN: 25.0000 ug | INTRAMUSCULAR | Status: DC | PRN

## 2023-12-20 MED ORDER — VITAMIN D (ERGOCALCIFEROL) 1.25 MG (50000 UNIT) PO CAPS: 50000.0000 [IU] | ORAL_CAPSULE | ORAL | 0 refills | Status: AC

## 2023-12-20 MED ORDER — EPHEDRINE 5 MG/ML INJ
INTRAVENOUS | Status: AC
  Filled 2023-12-20: qty 5

## 2023-12-20 MED ORDER — BUPIVACAINE HCL (PF) 0.5 % IJ SOLN
INTRAMUSCULAR | Status: DC | PRN
  Administered 2023-12-20 (×2): 10 mL via PERINEURAL

## 2023-12-20 MED ORDER — ASPIRIN 81 MG PO TBEC: 81.0000 mg | DELAYED_RELEASE_TABLET | Freq: Two times a day (BID) | ORAL | 0 refills | Status: AC

## 2023-12-20 MED ORDER — LACTATED RINGERS IV SOLN: INTRAVENOUS | Status: AC

## 2023-12-20 MED ORDER — FENTANYL CITRATE (PF) 100 MCG/2ML IJ SOLN
INTRAMUSCULAR | Status: AC
  Filled 2023-12-20: qty 2

## 2023-12-20 MED ORDER — TRANEXAMIC ACID-NACL 1000-0.7 MG/100ML-% IV SOLN
INTRAVENOUS | Status: DC | PRN
  Administered 2023-12-20: 1000 mg via INTRAVENOUS

## 2023-12-20 MED ORDER — OXYCODONE HCL 5 MG/5ML PO SOLN: 5.0000 mg | Freq: Once | ORAL | Status: DC | PRN

## 2023-12-20 MED ORDER — DROPERIDOL 2.5 MG/ML IJ SOLN
0.6250 mg | Freq: Once | INTRAMUSCULAR | Status: DC | PRN
Start: 2023-12-20 — End: 2023-12-20

## 2023-12-20 MED ORDER — PHENYLEPHRINE HCL-NACL 20-0.9 MG/250ML-% IV SOLN
INTRAVENOUS | Status: AC
  Filled 2023-12-20: qty 250

## 2023-12-20 MED ORDER — PROMETHAZINE HCL 25 MG PO TABS
12.5000 mg | ORAL_TABLET | ORAL | Status: DC | PRN
  Filled 2023-12-20: qty 1

## 2023-12-20 MED ORDER — PROPOFOL 10 MG/ML IV BOLUS
INTRAVENOUS | Status: DC | PRN
  Administered 2023-12-20: 125 ug/kg/min via INTRAVENOUS
  Administered 2023-12-20: 200 mg via INTRAVENOUS

## 2023-12-20 MED ORDER — TRANEXAMIC ACID-NACL 1000-0.7 MG/100ML-% IV SOLN
INTRAVENOUS | Status: AC
  Filled 2023-12-20: qty 100

## 2023-12-20 MED ORDER — CEFAZOLIN SODIUM-DEXTROSE 2-4 GM/100ML-% IV SOLN
INTRAVENOUS | Status: AC
  Filled 2023-12-20: qty 100

## 2023-12-20 MED ORDER — SUGAMMADEX SODIUM 500 MG/5ML IV SOLN
INTRAVENOUS | Status: DC | PRN
  Administered 2023-12-20: 50 mg via INTRAVENOUS

## 2023-12-20 MED ORDER — ONDANSETRON HCL 4 MG/2ML IJ SOLN
INTRAMUSCULAR | Status: AC
  Filled 2023-12-20: qty 2

## 2023-12-20 MED ORDER — ROCURONIUM BROMIDE 100 MG/10ML IV SOLN
INTRAVENOUS | Status: DC | PRN
  Administered 2023-12-20: 20 mg via INTRAVENOUS

## 2023-12-20 MED ORDER — TRAZODONE HCL 50 MG PO TABS
25.0000 mg | ORAL_TABLET | Freq: Every evening | ORAL | Status: DC | PRN
  Administered 2023-12-22: 25 mg via ORAL
  Filled 2023-12-20: qty 1

## 2023-12-20 MED ORDER — BUPIVACAINE HCL (PF) 0.25 % IJ SOLN
INTRAMUSCULAR | Status: AC
  Filled 2023-12-20: qty 30

## 2023-12-20 MED ORDER — CHLORHEXIDINE GLUCONATE 0.12 % MT SOLN
OROMUCOSAL | Status: AC
Start: 2023-12-20 — End: 2023-12-20
  Filled 2023-12-20: qty 15

## 2023-12-20 SURGICAL SUPPLY — 54 items
BIT 3.5X110 SOLID OVERDRILL (BIT) IMPLANT
BIT DRILL 2.0X130 SLD AO (BIT) IMPLANT
BIT DRILL 2.4X140 LONG SOLID (BIT) IMPLANT
BIT DRILL CANNULTD 2.6 X 130MM (DRILL) IMPLANT
BLADE SURG 15 STRL LF DISP TIS (BLADE) IMPLANT
BNDG COMPR 6X5.8 VLCR NS LF (GAUZE/BANDAGES/DRESSINGS) ×1 IMPLANT
BNDG ELASTIC 4INX 5YD STR LF (GAUZE/BANDAGES/DRESSINGS) ×1 IMPLANT
BNDG ELASTIC 4X5.8 VLCR NS LF (GAUZE/BANDAGES/DRESSINGS) ×1 IMPLANT
BNDG ESMARCH 4X12 STRL LF (GAUZE/BANDAGES/DRESSINGS) ×1 IMPLANT
CHLORAPREP W/TINT 26 (MISCELLANEOUS) ×1 IMPLANT
CUFF TRNQT CYL 24X4X16.5-23 (TOURNIQUET CUFF) IMPLANT
CUFF TRNQT CYL 30X4X21-28X (TOURNIQUET CUFF) IMPLANT
DRAPE C-ARM 42X70 (DRAPES) ×1 IMPLANT
DRAPE C-ARMOR (DRAPES) ×1 IMPLANT
ELECTRODE REM PT RTRN 9FT ADLT (ELECTROSURGICAL) ×1 IMPLANT
GAUZE SPONGE 4X4 12PLY STRL (GAUZE/BANDAGES/DRESSINGS) IMPLANT
GAUZE XEROFORM 1X8 LF (GAUZE/BANDAGES/DRESSINGS) ×1 IMPLANT
GLOVE SURG SYN 8.0 PF PI (GLOVE) ×2 IMPLANT
GOWN STRL REUS W/ TWL XL LVL3 (GOWN DISPOSABLE) ×1 IMPLANT
KIT TURNOVER KIT A (KITS) ×1 IMPLANT
KWIRE SNGL END 1.2X150 (MISCELLANEOUS) IMPLANT
LABEL OR SOLS (LABEL) ×1 IMPLANT
MANIFOLD NEPTUNE II (INSTRUMENTS) ×1 IMPLANT
NDL HYPO 22X1.5 SAFETY MO (MISCELLANEOUS) ×1 IMPLANT
NEEDLE HYPO 22X1.5 SAFETY MO (MISCELLANEOUS) ×1 IMPLANT
NS IRRIG 500ML POUR BTL (IV SOLUTION) ×1 IMPLANT
PACK EXTREMITY ARMC (MISCELLANEOUS) ×1 IMPLANT
PAD ABD DERMACEA PRESS 5X9 (GAUZE/BANDAGES/DRESSINGS) IMPLANT
PAD PREP OB/GYN DISP 24X41 (PERSONAL CARE ITEMS) ×1 IMPLANT
PADDING CAST BLEND 4X4 NS (MISCELLANEOUS) ×2 IMPLANT
PENCIL SMOKE EVACUATOR (MISCELLANEOUS) ×1 IMPLANT
PLATE FIBULAR CL 11H LT (Plate) IMPLANT
SCREW 4.0 X 40 LT (Screw) IMPLANT
SCREW LOCK PLATE R3 2.7X12 (Screw) IMPLANT
SCREW LOCK PLATE R3 2.7X15 (Screw) IMPLANT
SCREW LOCK PLATE R3 2.7X16 (Screw) IMPLANT
SCREW NLOCK PLATE RECON 3.5X10 (Screw) IMPLANT
SCREW NON LOCKING 3.5X12 (Screw) IMPLANT
SCREW NON LOCKING 3.5X20 (Screw) IMPLANT
SCREW NONLOCKING 3.5X24 (Screw) IMPLANT
SOLN STERILE WATER 500 ML (IV SOLUTION) ×1 IMPLANT
SPLINT CAST 1 STEP 4X30 (MISCELLANEOUS) ×2 IMPLANT
SPONGE T-LAP 18X18 ~~LOC~~+RFID (SPONGE) ×1 IMPLANT
STOCKINETTE M/LG 89821 (MISCELLANEOUS) ×1 IMPLANT
STOCKINETTE ORTHO 4X25 (MISCELLANEOUS) IMPLANT
STOCKINETTE ORTHO 6X25 (MISCELLANEOUS) IMPLANT
STRAP SAFETY 5IN WIDE (MISCELLANEOUS) ×1 IMPLANT
SUT MNCRL AB 3-0 PS2 27 (SUTURE) ×1 IMPLANT
SUT VIC AB 2-0 SH 27XBRD (SUTURE) IMPLANT
SUT VIC AB 3-0 SH 27X BRD (SUTURE) IMPLANT
SUTURE EHLN 3-0 FS-10 30 BLK (SUTURE) ×1 IMPLANT
SYR 10ML LL (SYRINGE) ×1 IMPLANT
TRAP FLUID SMOKE EVACUATOR (MISCELLANEOUS) ×1 IMPLANT
WIRE OLIVE SMOOTH 1.4MMX60MM (WIRE) IMPLANT

## 2023-12-20 NOTE — Anesthesia Preprocedure Evaluation (Addendum)
 Anesthesia Evaluation  Patient identified by MRN, date of birth, ID band Patient awake    Reviewed: Allergy & Precautions, NPO status , Patient's Chart, lab work & pertinent test results  History of Anesthesia Complications Negative for: history of anesthetic complications  Airway Mallampati: III  TM Distance: >3 FB Neck ROM: Full    Dental  (+) Edentulous Upper   Pulmonary asthma , sleep apnea and Continuous Positive Airway Pressure Ventilation , COPD,  COPD inhaler and oxygen dependent, former smoker  2L [place]         Cardiovascular Exercise Tolerance: Poor (-) hypertension(-) Past MI and (-) CHF Normal cardiovascular exam+ dysrhythmias (postural tachycardia) (-) Valvular Problems/Murmurs Rhythm:Regular - Peripheral Edema POTS    Neuro/Psych Seizures - (Seizure like activity followed by neurologist),  PSYCHIATRIC DISORDERS Anxiety Depression    CRPS, whole body per patient. Generalized weakness. Lower extremity numbness from knees down  Neuromuscular disease (CRPS)    GI/Hepatic Neg liver ROS, hiatal hernia (s/p repair),GERD  ,,Food found in EGD 2022; gastroparesis   Endo/Other  neg diabetesHypothyroidism    Renal/GU      Musculoskeletal  (+) Arthritis , Osteoarthritis,  CRPS (complex regional pain syndrome type I)   Abdominal  (+) + obese  Peds  Hematology   Anesthesia Other Findings   Reproductive/Obstetrics                              Anesthesia Physical Anesthesia Plan  ASA: 3  Anesthesia Plan: General   Post-op Pain Management: Regional block*   Induction: Intravenous  PONV Risk Score and Plan: 3 and Treatment may vary due to age or medical condition, Dexamethasone  and Ondansetron   Airway Management Planned:   Additional Equipment:   Intra-op Plan:   Post-operative Plan: Extubation in OR  Informed Consent: I have reviewed the patients History and Physical, chart, labs  and discussed the procedure including the risks, benefits and alternatives for the proposed anesthesia with the patient or authorized representative who has indicated his/her understanding and acceptance.     Dental advisory given  Plan Discussed with: Anesthesiologist and CRNA  Anesthesia Plan Comments: (Gastric Ultrasound: nonconclusive)         Anesthesia Quick Evaluation

## 2023-12-20 NOTE — Telephone Encounter (Signed)
 Patient Product/process Development Scientist completed.    The patient is insured through [employer]. Patient has Medicare and is not eligible for a copay card, but may be able to apply for patient assistance or Medicare RX Payment Plan (Patient Must reach out to their plan, if eligible for payment plan), if available.    Ran test claim for Breo Ellipta  100 mcg and the current 30 day co-pay is $0.00.  Ran test claim for Incruse Ellipta  and Product not on Formulary, Spiriva  Respimat is preferred  This test claim was processed through [employer]- copay amounts may vary at other pharmacies due to [employer], or as the patient moves through the different stages of their insurance plan.     Reyes Sharps, CPHT Pharmacy Technician Patient Advocate Specialist Lead [facility] Patient Advocate Team Direct Number: [phone]  Fax: [phone]

## 2023-12-20 NOTE — Op Note (Signed)
 Patient Name: Katie Neal DOB: March 10, 1970  MRN: [id number]   Date of Service: 12/18/2023 - 12/20/2023  Surgeon: Dr. Prentice Ovens, DPM Assistants: None Pre-operative Diagnosis:  left trimalleolar ankle fracture Post-operative Diagnosis:   left trimalleolar ankle fracture Procedures: Procedure:    OPEN REDUCTION INTERNAL FIXATION (ORIF) ANKLE FRACTURE CPT(R) Code:  72177 - PR OPEN TX TRIMALLEOLAR ANKLE FX W/O FIXJ PST LIP  Pathology/Specimens: * No specimens in log * Anesthesia: General With preoperative popliteal/saphenous nerve block per anesthesia.  Exparel  was used. Hemostasis: Left thigh tourniquet 66 minutes Estimated Blood Loss: 10 mL Materials:  Implant Name Type Inv. Item Serial No. Manufacturer Lot No. LRB No. Used Action  SCREW NONLOCKING 3.5X24 - [id number] Screw SCREW NONLOCKING 3.5X24  [employer]  Left 1 Implanted and Explanted  PLATE FIBULAR CL 11H LT - [id number] Plate PLATE FIBULAR CL 11H LT  [employer]  Left 1 Implanted  SCREW LOCK PLATE R3 7.2K84 - [id number] Screw SCREW LOCK PLATE R3 7.2K84  [employer]  Left 2 Implanted  SCREW LOCK PLATE R3 7.2K87 - [id number] Screw SCREW LOCK PLATE R3 7.2K87  [employer]  Left 1 Implanted  SCREW LOCK PLATE R3 7.2K83 - [id number] Screw SCREW LOCK PLATE R3 7.2K83  [employer]  Left 1 Implanted  SCREW NON LOCKING 3.5X12 - [id number] Screw SCREW NON LOCKING 3.5X12  [employer]  Left 2 Implanted  SCREW NLOCK PLATE RECON 6.4K89 - [id number] Screw SCREW NLOCK PLATE RECON 6.4K89  [employer]  Left 2 Implanted  SCREW 4.0 X 40 LT - [id number] Screw SCREW 4.0 X 40 LT  [employer]  Left 2 Implanted  SCREW NON LOCKING 3.5X20 - [id number] Screw SCREW NON LOCKING 3.5X20  [employer]  Left 1 Implanted   Medications: Postoperative block of 19 cc 0.25 percent Marcaine  plain mixed with 1 mL dexamethasone  phosphate 4 mg/mL, 1g IV TXA Complications: None  Indications for Procedure:  This is a 53 y.o. female with a  left trimalleolar ankle fracture.  The patient became dizzy on 12/18/2023, fell and had immediate pain to her left ankle.  She presented to the emergency department where she was determined to have a trimalleolar ankle fracture without dislocation.  CT imaging was ordered and showed a small portion of the posterior malleolus was involved.  Fibula was displaced.  Medial malleolus was minimally displaced.  I discussed with the patient different options for treatment.  Due to likely development of instability and arthritis with nonoperative treatment, operative treatment was recommended.  Patient expressed understanding of this and wished to proceed.  She does take levorphanol for pain. She states that it was well controlled until the last couple of days.    Procedure in Detail: Patient was identified in pre-operative holding area. Formal consent was signed and the left lower extremity was marked.  Anesthesia performed popliteal/saphenous nerve block utilizing Exparel  with ultrasound guidance.  Patient was brought back to the operating room.  2 Grams Ancef  was started. 1g IV TXA was started. Anesthesia was induced. The extremity was prepped and draped in the usual sterile fashion. Timeout was taken to confirm patient name, laterality, and procedure prior to incision.   Left Trimalleolar Ankle ORIF Attention was directed to the left lateral ankle. A longitudinal incision was made midline over the distal fibula approximately 8 cm in length. The incision was deepened through subcutaneous tissues, deep fascia, and periosteum using sharp and blunt dissection. Care was taken to retract  all vital neurovascular structures. The periosteum was reflected off the fibula utilizing a key elevator and 15 blade. At this time the fibular fracture site was easily identified. Curettage was used to debride all hematoma formation. Traction was applied and reduction was achieved with bone reduction forceps. Fluoroscopy demonstrated  excellent alignment of the fracture site with restoration of the length, angulation, and rotation. Next, an interfragmentary 3.60mm [employer]28 fully threaded screw was placed under standard AO technique for compression. Excellent compression of the fracture site was observed.   A paragon28 anatomic fibular plate was placed over fibula and was well contoured. A combination of cancellous screws distal to the fracture site and cortical screws proximally were used to secure the plate. Hardware placement and reduction was confirmed with fluoroscopy.   Attention was then directed to the left medial ankle.  Fluoroscopic imaging showed that the medial malleolus was well reduced following fibular fixation. Next, two parallel K-wires were driven from distal to proximal through the medial malleolus and at an oblique angle. 2x 4.5mm partially threaded, cannulated screws from [employer] were used to secure the fracture site. Excellent compression of the fracture site was observed.   Hardware placement and reduction was confirmed with fluoroscopy. The area was then flushed with copious amounts of sterile saline.   Attention was directed to the ankle joint. An intraoperative external rotation stress test was performed under fluoroscopy to evaluate for syndesmotic injury. Increased widening of the syndesmosis as well as the medial clear space was not noted with the syndesmosis stable.   I then proceeded with layered closure utilizing 2-0 Vicryl for deep/periosteal closure, 3-0 Vicryl for subcutaneous closure and 4-0 nylon in horizontal mattress fashion for skin closure.  The tourniquet was released after subcutaneous closure with immediate hyperemic response noted well-maintained and good hemostasis.  Tourniquet time was 66 minutes.  Postoperative block consisting of 19 cc 0.25% Marcaine  plain and 1 mL 4 mg/mL dexamethasone  phosphate was infiltrated into the surgical sites  The foot was then dressed with Xeroform, 4 x 4,  Kerlix, Ace.  The patient was placed into a cam walker as opposed to a splint due to history of CRPS. Patient tolerated the procedure well.  She is to be strictly weightbearing.   Disposition: Patient did very well in surgery, once her pain is adequately controlled she is stable for discharge from the podiatric standpoint.  I did place prescriptions for aspirin  81 mg twice daily, vitamin D  50,000 units once weekly, oxycodone /acetaminophen  7.5/325 x 28 tabs every 4 hours to every 6 hours as needed.  She is to be strictly nonweightbearing and treat the cam walker like a cast.

## 2023-12-20 NOTE — Anesthesia Procedure Notes (Signed)
 Procedure Name: Intubation Date/Time: 12/20/2023 11:47 AM  Performed by: Gillermo Spruce I, CRNAPre-anesthesia Checklist: Patient identified, Emergency Drugs available, Suction available and Patient being monitored Patient Re-evaluated:Patient Re-evaluated prior to induction Oxygen Delivery Method: Circle system utilized Preoxygenation: Pre-oxygenation with 100% oxygen Induction Type: IV induction Ventilation: Mask ventilation without difficulty Laryngoscope Size: McGrath and 3 Grade View: Grade I Tube type: Oral Tube size: 7.0 mm Number of attempts: 1 Airway Equipment and Method: Stylet and Oral airway Placement Confirmation: ETT inserted through vocal cords under direct vision, positive ETCO2 and breath sounds checked- equal and bilateral Secured at: 20 cm Tube secured with: Tape Dental Injury: Teeth and Oropharynx as per pre-operative assessment

## 2023-12-20 NOTE — Progress Notes (Signed)
 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 S/p ORIF 12/20/23 as per podiatry. Non weightbearing of LLE as per podiatry. Continue on home dose of levorphanol. Percocet, fentanyl  prn for pain as pt has chronic pain and takes pain meds daily. Podiatry following and recs apprec  Fall: will consult PT/OT when appropriate    Complex regional pain syndrome: continue on home dose of gabapentin , levorphanol    COPD with asthma: w/o exacerbation. Unknown stage and/or severity. Continue on bronchodilators. Bronchodilators prn    HLD: continue on statin   Hypothyroidism: continue on levothyroxine     Seizure: continue on home dose of lamictal . Ativan  prn for seizures   Hypokalemia: WNL today    IBS: bentyl  prn    AKI: resolved    Hypotension: resolved   Migraine: on emgality  qmonthly    Depression: severity unknown. Continue on home dose of venlafaxine     Obesity: BMI 31.6. Would benefit from weight loss. On Zepbound  already          DVT prophylaxis: SCDs Code Status: full  Family Communication:  Disposition Plan: depends on PT/OT recs (not consulted yet)  Level of care: Telemetry Consultants:  podiatry  Procedures:   Antimicrobials:    Subjective: Pt c/o ankle pain   Objective: Vitals:   12/19/23 1948 12/20/23 0454 12/20/23 0811 12/20/23 0815  BP: (!) 133/90 118/85  (!) 88/51 (!) 92/54  Pulse: 89 82 89 88  Resp: 16 16 15 16   Temp: 98.5 F (36.9 C) 97.6 F (36.4 C) 97.8 F (36.6 C)   TempSrc: Oral     SpO2: 98% 98% 91%   Height:        Intake/Output Summary (Last 24 hours) at 12/20/2023 0845 Last data filed at 12/19/2023 2140 Gross per 24 hour  Intake --  Output 1900 ml  Net -1900 ml   There were no vitals filed for this visit.  Examination:  General exam: Appears comfortable  Respiratory system: clear breath sounds b/l  Cardiovascular system: S1/S2+ Gastrointestinal system: Abd is soft, NT, obese & hypoactive bowel sounds Central nervous system: alert & oriented. Moves all extremities  Psychiatry: judgement and insight appears at baseline. Appropriate mood and affect    Data Reviewed: I have personally reviewed following labs and imaging studies  CBC: Recent Labs  Lab 12/16/23 1432 12/18/23 2013 12/19/23 0453  WBC 6.3 9.1 6.9  HGB 13.9 12.9 12.8  HCT 40.7 38.2 37.0  MCV 87.2 88.2 86.7  PLT 179 187 193   Basic Metabolic Panel: Recent Labs  Lab 12/16/23 1432 12/18/23 2013 12/19/23 0453 12/20/23 0516  NA 139 136 138 141  K 3.1* 3.1* 4.5 3.8  CL 100 100 103 105  CO2 29 26 24  21*  GLUCOSE 161* 174* 117* 122*  BUN 6 <5* 5* 6  CREATININE 0.88 1.17* 0.93 0.78  CALCIUM 9.0 8.6* 8.8* 9.0  MG  --   --  2.1  --   PHOS  --   --  3.6  --    GFR: Estimated Creatinine Clearance: 89.2 mL/min (by C-G formula based on SCr of 0.78 mg/dL). Liver Function Tests: Recent Labs  Lab 12/16/23 1432 12/18/23 2013  AST 16 16  ALT 13 12  ALKPHOS 87 78  BILITOT 0.8 0.6  PROT 6.4* 5.8*  ALBUMIN 4.3 4.0   No results for input(s): LIPASE, AMYLASE in the last 168 hours. No results for input(s): AMMONIA in the last 168 hours. Coagulation Profile: Recent Labs  Lab 12/19/23 0453  INR 1.0   Cardiac Enzymes: No results for input(s): CKTOTAL, CKMB, CKMBINDEX, TROPONINI in the last 168 hours. BNP (last 3 results) No  results for input(s): PROBNP in the last 8760 hours. HbA1C: No results for input(s): HGBA1C in the last 72 hours. CBG: Recent Labs  Lab 12/18/23 2017  GLUCAP 156*   Lipid Profile: No results for input(s): CHOL, HDL, LDLCALC, TRIG, CHOLHDL, LDLDIRECT in the last 72 hours. Thyroid  Function Tests: No results for input(s): TSH, T4TOTAL, FREET4, T3FREE, THYROIDAB in the last 72 hours. Anemia Panel: No results for input(s): VITAMINB12, FOLATE, FERRITIN, TIBC, IRON, RETICCTPCT in the last 72 hours. Sepsis Labs: No results for input(s): PROCALCITON, LATICACIDVEN in the last 168 hours.  No results found for this or any previous visit (from the past 240 hours).       Radiology Studies: CT HEAD WO CONTRAST ( ) Result Date: 12/19/2023 EXAM: CT HEAD WITHOUT CONTRAST 12/19/2023 12:45:06 AM TECHNIQUE: CT of the head was performed without the administration of intravenous contrast. Automated exposure control, iterative reconstruction, and/or weight based adjustment of the mA/kV was utilized to reduce the radiation dose to as low as reasonably achievable. COMPARISON: 04/05/2023 CLINICAL HISTORY: Head trauma, moderate-severe. FINDINGS: BRAIN AND VENTRICLES: No acute hemorrhage. No evidence of acute infarct. No hydrocephalus. No extra-axial collection. No mass effect or midline shift. ORBITS: No acute abnormality. SINUSES: No acute abnormality. SOFT TISSUES AND SKULL: No acute soft tissue abnormality. No skull fracture. IMPRESSION: 1. No acute intracranial abnormality. Electronically signed by: Franky Crease MD 12/19/2023 12:50 AM EST RP Workstation: [id number]   CT ANKLE LEFT WO CONTRAST Result Date: 12/19/2023 EXAM: CT LEFT ANKLE, WITHOUT IV CONTRAST 12/19/2023 12:45:06 AM TECHNIQUE: Axial images were acquired through the left ankle without IV contrast. Reformatted images were reviewed. Automated exposure control, iterative reconstruction, and/or weight based  adjustment of the mA/kV was utilized to reduce the radiation dose to as low as reasonably achievable. COMPARISON: Plain films 12/18/2023. CLINICAL HISTORY: Ankle trauma, fracture, xray done (Age >= 5y). FINDINGS: BONES: Oblique fractures noted through the distal fibula at the level of the ankle mortise and at the base of the medial malleolus. Coronal fracture through the posterior malleolus with slight displacement. JOINTS: No subluxation or dislocation. The joint spaces are normal. SOFT TISSUES: The soft tissues are unremarkable. IMPRESSION: 1. Trimalleolar left ankle fracture. Electronically signed by: Franky Crease MD 12/19/2023 12:49 AM EST RP Workstation: [id number]   DG Ankle Complete Left Result Date: 12/18/2023 EXAM: 3 OR MORE VIEW(S) XRAY OF THE LEFT ANKLE 12/18/2023 09:11:00 PM CLINICAL HISTORY: fall, ankle pain COMPARISON: None available. FINDINGS: BONES AND JOINTS: Oblique fractures of the distal fibula with fracture lines extending to the tibiofibular joint. Mild lateral displacement of distal fracture fragments. Fracture of the base of the medial malleolus extending to the articular surface. Mildly displaced coronal fracture of the posterior malleolus. Degenerative changes in the intertarsal  joints. No joint dislocation. SOFT TISSUES: Soft tissue swelling. IMPRESSION: 1. Oblique distal fibular fracture with extension to the tibiofibular joint and mild lateral displacement of distal fragments. 2. Fracture of the base of the medial malleolus extending to the articular surface. 3. Mildly displaced coronal fracture of the posterior malleolus. 4. Soft tissue swelling. Electronically signed by: Elsie Gravely MD 12/18/2023 09:18 PM EST RP Workstation: [id number]        Scheduled Meds:  amitriptyline   25 mg Oral QHS   cetirizine   10 mg Oral QPM   dicyclomine   20 mg Oral TID AC   docusate  50 mg Oral QHS   doxepin   10 mg Oral QHS   fluticasone  furoate-vilanterol  1 puff Inhalation Daily    gabapentin   100 mg Oral TID   Galcanezumab -gnlm  120 mg Subcutaneous Q30 days   lamoTRIgine   100 mg Oral Daily   And   lamoTRIgine   200 mg Oral QHS   levothyroxine   137 mcg Oral Q0600   linaclotide   290 mcg Oral QHS   montelukast   10 mg Oral Daily   multivitamin with minerals  1 tablet Oral Daily   pantoprazole   40 mg Oral Daily   predniSONE   20 mg Oral Daily   QUEtiapine   300 mg Oral QHS   simvastatin   10 mg Oral QHS   traZODone   200 mg Oral QHS   umeclidinium bromide   1 puff Inhalation Daily   venlafaxine  XR  225 mg Oral Q breakfast   Continuous Infusions:  sodium chloride  75 mL/hr at 12/20/23 0601     LOS: 1 day      Anthony CHRISTELLA Pouch, MD [facility] Pager [phone]  If 7PM-7AM, please contact night-coverage [email or web address] 12/20/2023, 8:45 AM

## 2023-12-20 NOTE — Transfer of Care (Signed)
 Immediate Anesthesia Transfer of Care Note  Patient: Katie Neal  Procedure(s) Performed: OPEN REDUCTION INTERNAL FIXATION (ORIF) ANKLE FRACTURE (Left: Ankle)  Patient Location: PACU  Anesthesia Type:General  Level of Consciousness: drowsy, patient cooperative, and responds to stimulation  Airway & Oxygen Therapy: Patient Spontanous Breathing and Patient connected to face mask oxygen  Post-op Assessment: Report given to RN and Post -op Vital signs reviewed and stable  Post vital signs: stable  Last Vitals:  Vitals Value Taken Time  BP 143/94 12/20/23 13:45  Temp    Pulse 88 12/20/23 13:45  Resp 15 12/20/23 13:45  SpO2 100 % 12/20/23 13:45  Vitals shown include unfiled device data.  Last Pain:  Vitals:   12/20/23 1059  TempSrc: Temporal  PainSc: 7       Patients Stated Pain Goal: 7 (12/20/23 0720)  Complications: No notable events documented.

## 2023-12-20 NOTE — Anesthesia Procedure Notes (Signed)
 Anesthesia Regional Block: Adductor canal block   Pre-Anesthetic Checklist: , timeout performed,  Correct Patient, Correct Site, Correct Laterality,  Correct Procedure, Correct Position, site marked,  Risks and benefits discussed,  Surgical consent,  Pre-op evaluation,  At surgeon's request and post-op pain management  Laterality: Left  Prep: chloraprep       Needles:  Injection technique: Single-shot  Needle Type: Stimiplex     Needle Length: 9cm  Needle Gauge: 22     Additional Needles:   Procedures:,,,, ultrasound used (permanent image in chart),,    Narrative:  Start time: 12/20/2023 11:33 AM End time: 12/20/2023 11:37 AM Injection made incrementally with aspirations every 5 mL.  Performed by: Personally  Anesthesiologist: Vicci Camellia Glatter, MD  Additional Notes: Patient consented for risk and benefits of nerve block including but not limited to nerve damage, failed block, bleeding and infection.  Patient voiced understanding.  Functioning IV was confirmed and monitors were applied.  Timeout done prior to procedure and prior to any sedation being given to the patient.  Patient confirmed procedure site prior to any sedation given to the patient. Sterile prep,hand hygiene and sterile gloves were used.  Minimal sedation used for procedure.  No paresthesia endorsed by patient during the procedure.  Negative aspiration and negative test dose prior to incremental administration of local anesthetic. The patient tolerated the procedure well with no immediate complications.

## 2023-12-20 NOTE — Brief Op Note (Signed)
 12/20/2023  1:40 PM  PATIENT:  Katie Neal  53 y.o. female  PRE-OPERATIVE DIAGNOSIS:  left trimalleolar ankle fracture  POST-OPERATIVE DIAGNOSIS:  s/p left trimalleolar ankle fracture  PROCEDURE:  Procedure(s) with comments: OPEN REDUCTION INTERNAL FIXATION (ORIF) ANKLE FRACTURE (Left) - General with pre-operative popliteal/saphenous nerve block  SURGEON:  Surgeons and Role:    * Magdalen Prentice PARAS, DPM - Primary  PHYSICIAN ASSISTANT:   ASSISTANTS: none   ANESTHESIA:   general and popliteal/saphenous nerve block with exparel  per anesthesia service  EBL:  10 mL   BLOOD ADMINISTERED:none  DRAINS: none   LOCAL MEDICATIONS USED: 0.25% MARCAINE  19cc mixed with 1mL dexamethasone  phosphate 4mg /mL  SPECIMEN:  No Specimen  DISPOSITION OF SPECIMEN:  N/A  COUNTS:  YES  TOURNIQUET:   Total Tourniquet Time Documented: Thigh (Left) - -8576 minutes Total: Thigh (Left) - -8576 minutes   DICTATION: .Note written in EPIC  PLAN OF CARE: Admit back to inpatient, once pain is adequately controlled patient can be discharged  PATIENT DISPOSITION:  PACU - hemodynamically stable.   Delay start of Pharmacological VTE agent (>24hrs) due to surgical blood loss or risk of bleeding: start VTE agent tomorrow morning if patient is still in hospital

## 2023-12-20 NOTE — Progress Notes (Signed)
 History and Physical Interval Note:  12/20/2023 11:04 AM  Katie Neal  has presented today for surgery, with the diagnosis of Left ankle trimalleolar ankle fracture with fibular displacement.  The various methods of treatment have been discussed with the patient and family. After consideration of risks, benefits and other options for treatment, the patient has consented to   Procedure(s) with comments: OPEN REDUCTION INTERNAL FIXATION (ORIF) ANKLE FRACTURE (Left) - General with pre-operative popliteal/saphenous nerve block as a surgical intervention.  The patient's history has been reviewed, patient examined, no change in status, stable for surgery.  I have reviewed the patient's chart and labs.  Questions were answered to the patient's satisfaction.     Prentice PARAS  [name]

## 2023-12-20 NOTE — Plan of Care (Signed)

## 2023-12-20 NOTE — Anesthesia Procedure Notes (Signed)
 Anesthesia Regional Block: Popliteal block   Pre-Anesthetic Checklist: , timeout performed,  Correct Patient, Correct Site, Correct Laterality,  Correct Procedure, Correct Position, site marked,  Risks and benefits discussed,  Surgical consent,  Pre-op evaluation,  At surgeon's request and post-op pain management  Laterality: Left  Prep: chloraprep       Needles:  Injection technique: Single-shot  Needle Type: Stimiplex     Needle Length: 9cm  Needle Gauge: 22     Additional Needles:   Procedures:,,,, ultrasound used (permanent image in chart),,    Narrative:  Start time: 12/20/2023 11:38 AM End time: 12/20/2023 11:40 AM Injection made incrementally with aspirations every 5 mL.  Performed by: Personally  Anesthesiologist: Vicci Camellia Glatter, MD  Additional Notes: Patient consented for risk and benefits of nerve block including but not limited to nerve damage, failed block, bleeding and infection.  Patient voiced understanding.  Functioning IV was confirmed and monitors were applied.  Timeout done prior to procedure and prior to any sedation being given to the patient.  Patient confirmed procedure site prior to any sedation given to the patient. Sterile prep,hand hygiene and sterile gloves were used.  Minimal sedation used for procedure.  No paresthesia endorsed by patient during the procedure.  Negative aspiration and negative test dose prior to incremental administration of local anesthetic. The patient tolerated the procedure well with no immediate complications.

## 2023-12-21 DIAGNOSIS — S82892A Other fracture of left lower leg, initial encounter for closed fracture: Secondary | ICD-10-CM | POA: Diagnosis not present

## 2023-12-21 LAB — BASIC METABOLIC PANEL WITH GFR
Anion gap: 8 (ref 5–15)
BUN: 6 mg/dL (ref 6–20)
CO2: 26 mmol/L (ref 22–32)
Calcium: 8.7 mg/dL — ABNORMAL LOW (ref 8.9–10.3)
Chloride: 106 mmol/L (ref 98–111)
Creatinine, Ser: 0.7 mg/dL (ref 0.44–1.00)
GFR, Estimated: >60 mL/min (ref >60)
Glucose, Bld: 141 mg/dL — ABNORMAL HIGH (ref 70–99)
Potassium: 3.5 mmol/L (ref 3.5–5.1)
Sodium: 141 mmol/L (ref 135–145)

## 2023-12-21 MED ORDER — GALCANEZUMAB-GNLM 120 MG/ML ~~LOC~~ SOAJ
120.0000 mg | SUBCUTANEOUS | Status: DC
  Administered 2023-12-21: 120 mg via SUBCUTANEOUS
  Filled 2023-12-21: qty 1

## 2023-12-21 MED ORDER — TIRZEPATIDE-WEIGHT MANAGEMENT 5 MG/0.5ML ~~LOC~~ SOAJ
5.0000 mg | SUBCUTANEOUS | Status: DC
  Administered 2023-12-21: 5 mg via SUBCUTANEOUS
  Filled 2023-12-21: qty 0.5

## 2023-12-21 MED ORDER — MORPHINE SULFATE ER 30 MG PO TBCR
30.0000 mg | EXTENDED_RELEASE_TABLET | Freq: Two times a day (BID) | ORAL | Status: DC
  Administered 2023-12-21 – 2023-12-24 (×7): 30 mg via ORAL
  Filled 2023-12-21 (×7): qty 1

## 2023-12-21 MED ORDER — OXYCODONE HCL 5 MG PO TABS
10.0000 mg | ORAL_TABLET | ORAL | Status: DC | PRN
  Administered 2023-12-23: 10 mg via ORAL
  Filled 2023-12-21: qty 2

## 2023-12-21 MED ORDER — MEPOLIZUMAB 100 MG/ML ~~LOC~~ SOAJ
100.0000 mg | SUBCUTANEOUS | Status: DC
  Administered 2023-12-21: 100 mg via SUBCUTANEOUS
  Filled 2023-12-21: qty 1

## 2023-12-21 NOTE — Plan of Care (Signed)

## 2023-12-21 NOTE — TOC Progression Note (Addendum)
 Transition of Care [facility]) - Progression Note    Patient Details  Name: Katie Neal MRN: [id number] Date of Birth: 05-Jan-1971  [facility] of [facility] CM/SW Contact  Ozell Ferrera L Mariyana [place], [name] Phone Number: 12/21/2023, 3:53 PM  Clinical Narrative:     TOC initial consult completed. Readmission screen completed. Patient advises that she reside with her adult son, and his family. She stated that she is unable to live alone. She reports having a shower seat and a raised toilet seat at home.   PCP is Dr. Rudolpho and she had a follow-up appointment with him 2 weeks ago.   Pharmacies are [facility] (pain meds) and [facility] for other meds.   Patient is agreeable to accepting all recommending DME. Patient provided [employer] as a preference. She understands that it that company is not an option, [facility] will fill order.   Patient open to going to SNF however she stated that she would like to discharge home.    4:10pm: CSW spoke with patient to obtain choice for [facility]. Patient provided no preference and advised CSW to choose. CSW advised that [facility] has PT, OT, RN and an aide, which are within her service needs. Patient accepted.   CSW advised that [employer] was not an option. Patient advised that she was accepting of [facility] filling DME order.                  Expected Discharge Plan and Services                                               Social Drivers of Health (SDOH) Interventions SDOH Screenings   Food Insecurity: No Food Insecurity (12/19/2023)  Housing: Unknown (12/19/2023)  Transportation Needs: No Transportation Needs (12/19/2023)  Tobacco Use: Medium Risk (12/16/2023)   Received from [facility]    Readmission Risk Interventions     No data to display

## 2023-12-21 NOTE — Evaluation (Signed)
 Occupational Therapy Evaluation Patient Details Name: Katie Neal MRN: [id number] DOB: [birth year] Today's Date: 12/21/2023   History of Present Illness   Pt is a 53 y/o F admitted on 12/18/23 after presenting with c/o fall resulting in LLE pain. Pt found to have closed L ankle fx. Pt is s/p L ORIF 12/20/23. PMH: migraine HAs, CRPS, HLD, asthma, GERD, hypothyroidism, depression, anxiety, PTSD, IBS, insomnia, seizure, cardiomegaly, cerebral atrophy, COPD     Clinical Impressions Pt was seen for OT evaluation this date. Prior to hospital admission, pt was using varied DME for mobility, has had multiple falls in past 43mo, uses doordash or microwave meals (standing tolerance limited by CRPS), bathes (typically sink bath) & dresses with PRN assistance for dressing, DIL takes her to dr appts, has groceries delivered, and family assists with housekeeping and laundry. She reports caring for her grandson a couple days a week as well. Pt presents with deficits in strength, balance, activity tolerance, and pain, affecting safe and optimal ADL completion. Pt currently requires CGA for SPT while maintaining NWBing to LLE in CAM, anticipate MIN A for LB ADL and pericare for thoroughness. Pt educated in home/routines modifications, falls prevention, AE/DME for ADL.  Pt would benefit from skilled OT services to address noted impairments and functional limitations (see below for any additional details) in order to maximize safety and independence while minimizing future risk of falls, injury, and readmission. Anticipate the need for follow up OT services upon acute hospital DC.   If plan is discharge home, recommend the following:   A little help with walking and/or transfers;A little help with bathing/dressing/bathroom;Assistance with cooking/housework;Assist for transportation;Help with stairs or ramp for entrance     Functional Status Assessment   Patient has had a recent decline in their functional  status and demonstrates the ability to make significant improvements in function in a reasonable and predictable amount of time.     Equipment Recommendations   Tub/shower bench;Wheelchair (measurements OT);Wheelchair cushion (measurements OT);Other (comment);BSC/3in1 (toileting aide, DROP ARM BSC)     Recommendations for Other Services         Precautions/Restrictions   Precautions Precautions: Fall Recall of Precautions/Restrictions: Intact Precaution/Restrictions Comments: LLE cam boot Restrictions Weight Bearing Restrictions Per Provider Order: Yes LLE Weight Bearing Per Provider Order: Non weight bearing     Mobility Bed Mobility Overal bed mobility: Modified Independent Bed Mobility: Sit to Supine       Sit to supine: Modified independent (Device/Increase time)        Transfers Overall transfer level: Needs assistance Equipment used: None Transfers: Bed to chair/wheelchair/BSC   Stand pivot transfers: Contact guard assist         General transfer comment: maintained NWBing      Balance Overall balance assessment: Needs assistance Sitting-balance support: Feet supported Sitting balance-Leahy Scale: Good     Standing balance support: Single extremity supported Standing balance-Leahy Scale: Poor                             ADL either performed or assessed with clinical judgement   ADL Overall ADL's : Needs assistance/impaired                     Lower Body Dressing: Sitting/lateral leans;Minimal assistance   Toilet Transfer: Contact guard Public Relations Account Executive Details (indicate cue type and reason): simulated Toileting- Clothing Manipulation and Hygiene: Minimal assistance Toileting - Clothing Manipulation Details (indicate cue type  and reason): educated in use of toileting aide to assist in pericare             Vision         Perception         Praxis         Pertinent Vitals/Pain  Pain Assessment Pain Assessment: Faces Faces Pain Scale: Hurts little more Pain Location: L foot, back Pain Intervention(s): Limited activity within patient's tolerance, Monitored during session, Premedicated before session, Repositioned     Extremity/Trunk Assessment Upper Extremity Assessment Upper Extremity Assessment: Overall WFL for tasks assessed   Lower Extremity Assessment Lower Extremity Assessment: Generalized weakness;RLE deficits/detail;LLE deficits/detail;Defer to PT evaluation RLE Deficits / Details: reports hx of bad knee, reports 9 R knee surgeries, ACL injury LLE Deficits / Details: CAM boot LLE: Unable to fully assess due to pain;Unable to fully assess due to immobilization       Communication Communication Communication: No apparent difficulties   Cognition Arousal: Alert Behavior During Therapy: WFL for tasks assessed/performed Cognition: No apparent impairments                               Following commands: Intact       Cueing  General Comments   Cueing Techniques: Verbal cues      Exercises Other Exercises Other Exercises: Pt educated in home/routines modifications, falls prevention, AE/DME for improved safety with bathing, toileting, and dressing   Shoulder Instructions      Home Living Family/patient expects to be discharged to:: Private residence Living Arrangements: Children Available Help at Discharge: Available PRN/intermittently Type of Home: Apartment Home Access:  (small threshold entry)     Home Layout: One level     Bathroom Shower/Tub: Chief Strategy Officer: Handicapped height     Home Equipment: Grab bars - tub/shower;Shower seat;Rollator (4 wheels);Rolling Walker (2 wheels);Wheelchair - manual;Toilet riser;Crutches;Adaptive equipment;Hand held shower head (w/c is almost 53 y/o, sleeps on wedges 2/2 unable to tolerate lying flat) Adaptive Equipment: Reacher Additional Comments: Home alone,  watches her 2 y/o grandson sometimes ~2 days/wk for 3-4hrs and every Saturday. Pt reports she's on 3L/min supplemental O2 PRN (when SOB, sleeping).      Prior Functioning/Environment Prior Level of Function : Needs assist;History of Falls (last six months)             Mobility Comments: Ambulatory with RW, rollator, or loftstrand crutches, at least 4-5 falls in the past 6 months. ADLs Comments: doordash or microwave meals (standing tolerance limited by CRPS), bathes (typically sink bath) & dresses with PRN assistance for dressing, DIL takes her to dr appts, has groceries delivered, family assists with housekeeping and laundry    OT Problem List: Decreased strength;Pain;Decreased range of motion;Decreased activity tolerance;Impaired balance (sitting and/or standing);Decreased knowledge of use of DME or AE;Obesity   OT Treatment/Interventions: Self-care/ADL training;Therapeutic exercise;Therapeutic activities;DME and/or AE instruction;Patient/family education;Balance training;Energy conservation      OT Goals(Current goals can be found in the care plan section)   Acute Rehab OT Goals Patient Stated Goal: get better OT Goal Formulation: With patient Time For Goal Achievement: 01/04/24 Potential to Achieve Goals: Good ADL Goals Pt Will Perform Lower Body Dressing: with modified independence;sitting/lateral leans;with adaptive equipment Pt Will Transfer to Toilet: with modified independence;bedside commode;stand pivot transfer (LRAD) Pt Will Perform Toileting - Clothing Manipulation and hygiene: with modified independence;sitting/lateral leans;with adaptive equipment Additional ADL Goal #1: Pt will verbalize plan to  implement at least 1 learned falls prevention strategy to maximize safety.   OT Frequency:  Min 2X/week    Co-evaluation              AM-PAC OT 6 Clicks Daily Activity     Outcome Measure Help from another person eating meals?: None Help from another person  taking care of personal grooming?: None Help from another person toileting, which includes using toliet, bedpan, or urinal?: A Little Help from another person bathing (including washing, rinsing, drying)?: A Little Help from another person to put on and taking off regular upper body clothing?: None Help from another person to put on and taking off regular lower body clothing?: A Little 6 Click Score: 21   End of Session    Activity Tolerance: Patient tolerated treatment well Patient left: in bed;with call bell/phone within reach;with bed alarm set  OT Visit Diagnosis: Other abnormalities of gait and mobility (R26.89);Repeated falls (R29.6);Muscle weakness (generalized) (M62.81);Pain Pain - Right/Left: Left Pain - part of body: Ankle and joints of foot                Time: 9062-8997 OT Time Calculation (min): 25 min Charges:  OT General Charges $OT Visit: 1 Visit OT Evaluation $OT Eval Low Complexity: 1 Low OT Treatments $Self Care/Home Management : 8-22 mins  Warren SAUNDERS., MPH, MS, OTR/L ascom [phone] 12/21/23, 1:58 PM

## 2023-12-21 NOTE — Progress Notes (Addendum)
    Durable Medical Equipment  (From admission, onward)           Start     Ordered   12/21/23 1544  For home use only DME wheelchair cushion (seat and back)  Once        12/21/23 1543   12/21/23 1544  For home use only DME Shower stool  Once        12/21/23 1543   12/21/23 1543  For home use only DME 3 n 1  Once        12/21/23 1543   12/21/23 1115  For home use only DME standard manual wheelchair with seat cushion  Once       Comments: Patient suffers from left ankle fracture which impairs their ability to perform daily activities like bathing, dressing, feeding, grooming, and toileting in the home.  A cane, crutch, or walker will not resolve issue with performing activities of daily living. A wheelchair will allow patient to safely perform daily activities. Patient can safely propel the wheelchair in the home or has a caregiver who can provide assistance. Length of need 12 months . Accessories: elevating leg rests (ELRs), wheel locks, extensions and anti-tippers.   12/21/23 1115

## 2023-12-21 NOTE — Evaluation (Signed)
 Physical Therapy Evaluation Patient Details Name: Katie Neal MRN: [id number] DOB: January 08, 1971 Today's Date: 12/21/2023  History of Present Illness  Pt is a 53 y/o F admitted on 12/18/23 after presenting with c/o fall resulting in LLE pain. Pt found to have closed L ankle fx. Pt is s/p L ORIF 12/20/23. PMH: migraine HAs, CRPS, HLD, asthma, GERD, hypothyroidism, depression, anxiety, PTSD, IBS, insomnia, seizure, cardiomegaly, cerebral atrophy, COPD  Clinical Impression  Pt seen for PT evaluation with pt agreeable. Pt reports prior to admission she was ambulatory with AD, reports several falls in the past 6 months. On this date, educated pt on NWB LLE in cam boot, pt able to complete bed mobility & lateral scoot transfers with mod I. Pt reports hx of bad R knee. In light of pt's many falls, already requiring PRN assistance for mobility & new NWB LLE In cam boot, educated pt on recommendation to use w/c for all mobility & perform transfers only with pt agreeing & reporting understanding. Pt reports she has used a w/c in the past. Will continue to follow pt acutely to practice transfers from varying surfaces, w/c mobility, & LE HEP.         If plan is discharge home, recommend the following: A little help with walking and/or transfers;A little help with bathing/dressing/bathroom;Assistance with cooking/housework;Assist for transportation;Help with stairs or ramp for entrance   Can travel by private vehicle        Equipment Recommendations Wheelchair (measurements PT);Hospital bed;BSC/3in1;Wheelchair cushion (measurements PT) (drop arm BSC & w/c, elevating leg rests on w/c)  Recommendations for Other Services       Functional Status Assessment Patient has had a recent decline in their functional status and demonstrates the ability to make significant improvements in function in a reasonable and predictable amount of time.     Precautions / Restrictions Precautions Precautions:  Fall Precaution/Restrictions Comments: LLE cam boot Restrictions Weight Bearing Restrictions Per Provider Order: Yes LLE Weight Bearing Per Provider Order: Non weight bearing (in cam boot)      Mobility  Bed Mobility Overal bed mobility: Modified Independent, Needs Assistance Bed Mobility: Supine to Sit     Supine to sit: Modified independent (Device/Increase time)     General bed mobility comments: semi fowler>long sitting>sitting EOB, uses UE to assist LLE to sitting EOB    Transfers Overall transfer level: Needs assistance Equipment used: None Transfers: Bed to chair/wheelchair/BSC            Lateral/Scoot Transfers: Modified independent (Device/Increase time) General transfer comment: bed>drop arm recliner on R, equal height surfaces, good ability to maintain NWB LLE, use BUE to assist with scooting    Ambulation/Gait                  Stairs            Wheelchair Mobility     Tilt Bed    Modified Rankin (Stroke Patients Only)       Balance Overall balance assessment: Needs assistance Sitting-balance support: Feet supported Sitting balance-Leahy Scale: Good                                       Pertinent Vitals/Pain Pain Assessment Pain Assessment: Faces Faces Pain Scale: Hurts a little bit Pain Location: generalized Pain Descriptors / Indicators:  (chronic) Pain Intervention(s): Monitored during session    Home Living Family/patient expects to be discharged  to:: Private residence Living Arrangements: Children Available Help at Discharge: Available PRN/intermittently Type of Home: Apartment Home Access:  (small threshold entry)       Home Layout: One level Home Equipment: Grab bars - tub/shower;Shower seat;Rollator (4 wheels);Rolling Walker (2 wheels);Wheelchair - manual;Toilet riser;Crutches;Adaptive equipment;Hand held shower head (w/c is almost 53 y/o, sleeps on wedges 2/2 unable to tolerate lying  flat) Additional Comments: Home alone, watches her 2 y/o grandson sometimes ~2 days/wk for 3-4hrs and every Saturday. Pt reports she's on 3L/min supplemental O2 PRN (when SOB, sleeping).    Prior Function Prior Level of Function : Needs assist;History of Falls (last six months)             Mobility Comments: Ambulatory with RW, rollator, or loftstrand crutches, at least 4-5 falls in the past 6 months. ADLs Comments: doordash or microwave meals (standing tolerance limited by CRPS), bathes (typically sink bath) & dresses with PRN assistance for dressing, DIL takes her to dr appts, has groceries delivered, family assists with housekeeping and laundry     Extremity/Trunk Assessment   Upper Extremity Assessment Upper Extremity Assessment: Overall WFL for tasks assessed    Lower Extremity Assessment Lower Extremity Assessment: Generalized weakness;LLE deficits/detail;RLE deficits/detail RLE Deficits / Details: reports hx of bad knee, reports 9 R knee surgeries, ACL injury       Communication   Communication Communication: No apparent difficulties    Cognition Arousal: Alert Behavior During Therapy: WFL for tasks assessed/performed   PT - Cognitive impairments: No apparent impairments                       PT - Cognition Comments: appears to have good understanding of situation Following commands: Intact       Cueing Cueing Techniques: Verbal cues     General Comments      Exercises     Assessment/Plan    PT Assessment Patient needs continued PT services  PT Problem List Decreased strength;Pain;Cardiopulmonary status limiting activity;Decreased range of motion;Decreased activity tolerance;Decreased balance;Decreased knowledge of use of DME;Decreased mobility;Decreased knowledge of precautions       PT Treatment Interventions DME instruction;Balance training;Gait training;Neuromuscular re-education;Patient/family education;Functional mobility  training;Therapeutic activities;Wheelchair mobility training;Therapeutic exercise;Manual techniques;Modalities    PT Goals (Current goals can be found in the Care Plan section)  Acute Rehab PT Goals Patient Stated Goal: get better PT Goal Formulation: With patient Time For Goal Achievement: 01/04/24 Potential to Achieve Goals: Good Additional Goals Additional Goal #1: Pt will propel w/c x 100 ft with mod I to increase independence with mobility.    Frequency 7X/week     Co-evaluation               AM-PAC PT 6 Clicks Mobility  Outcome Measure Help needed turning from your back to your side while in a flat bed without using bedrails?: None Help needed moving from lying on your back to sitting on the side of a flat bed without using bedrails?: A Little Help needed moving to and from a bed to a chair (including a wheelchair)?: None Help needed standing up from a chair using your arms (e.g., wheelchair or bedside chair)?: A Lot Help needed to walk in hospital room?: Total Help needed climbing 3-5 steps with a railing? : Total 6 Click Score: 15    End of Session   Activity Tolerance: Patient tolerated treatment well Patient left: in chair;with chair alarm set;with call bell/phone within reach Nurse Communication: Weight bearing status PT  Visit Diagnosis: History of falling (Z91.81);Difficulty in walking, not elsewhere classified (R26.2);Other abnormalities of gait and mobility (R26.89);Muscle weakness (generalized) (M62.81)    Time: 9089-9067 PT Time Calculation (min) (ACUTE ONLY): 22 min   Charges:   PT Evaluation $PT Eval Low Complexity: 1 Low   PT General Charges $$ ACUTE PT VISIT: 1 Visit         Richerd Pinal, PT, DPT 12/21/23, 9:53 AM   Richerd CHRISTELLA Pinal 12/21/2023, 9:51 AM

## 2023-12-21 NOTE — Progress Notes (Addendum)
 PROGRESS NOTE    Katie Neal  FMW:[id number] DOB: 20-May-1970 DOA: 12/18/2023 PCP: [facility]   Assessment & Plan:   Principal Problem:   Closed left ankle fracture Active Problems:   CRPS (complex regional pain syndrome type I)   Fall at home, initial encounter   COPD with asthma (HCC)   HLD (hyperlipidemia)   Hypothyroidism   Seizure (HCC)   Hypokalemia   IBS (irritable bowel syndrome)   AKI (acute kidney injury)   Hypotension   Migraine   Depression with anxiety   Obesity (BMI 35.0-39.9 without comorbidity)  Assessment and Plan: Closed left ankle fracture: CT-left ankle showed Trimalleolar left ankle fracture. S/p ORIF 12/20/23 as per podiatry. Non weightbearing of LLE as per podiatry. Pain meds changed to oxy, MS contin  & fentanyl . Podiatry following and recs apprec  Fall: PT recs HH. Pt also requests a wheelchair, DME ordered placed & CM made aware    Complex regional pain syndrome: w/ acute pain secondary to above. D/c levorphanol as it is not on hospital formulary. Changed percocet to oxy only, added MS contin  & continue on fentanyl . Continue on home dose of gabapentin . Difficult to manage pain   COPD with asthma: w/o exacerbation. Unknown stage and/or severity. Bronchodilators prn    HLD: continue on statin   Hypothyroidism: continue on levothyroxine     Seizure: continue on home dose of lamictal . Ativan  prn for seizures   Hypokalemia: WNL today    IBS: bentyl  prn    AKI: resolved    Hypotension: resolved   Migraine: on emgality  qmonthly    Depression: severity unknown. Continue on home dose of venlafaxine    Obesity: BMI 31.6. Would benefit from weight loss. On Zepbound           DVT prophylaxis: SCDs Code Status: full  Family Communication:  Disposition Plan: d/c back home w/ HH   Level of care: Med-Surg Consultants:  podiatry  Procedures:   Antimicrobials:    Subjective: Pt c/o knee pain   Objective: Vitals:    12/20/23 1453 12/20/23 2023 12/21/23 0427 12/21/23 0735  BP: (!) 152/81 (!) 142/94 (!) 140/87 (!) 136/92  Pulse: 90 90 76 73  Resp: 16 18 17 18   Temp: 98.7 F (37.1 C) 98.5 F (36.9 C) 98.3 F (36.8 C) 97.9 F (36.6 C)  TempSrc: Oral  Oral Oral  SpO2: 100% 95% 98% 98%  Weight:      Height:        Intake/Output Summary (Last 24 hours) at 12/21/2023 0936 Last data filed at 12/20/2023 1720 Gross per 24 hour  Intake 320 ml  Output 10 ml  Net 310 ml   Filed Weights   12/20/23 1059  Weight: 83.5 kg    Examination:  General exam: appears calm & comfortable  Respiratory system: clear breath sounds b/l  Cardiovascular system: S1/S2+. No rubs or gallops  Gastrointestinal system: abd is soft, NT, obese & hypoactive bowel sounds Central nervous system: alert & oriented. Moves all extremities Psychiatry:judgement and insight appears at baseline. Flat mood and affect     Data Reviewed: I have personally reviewed following labs and imaging studies  CBC: Recent Labs  Lab 12/16/23 1432 12/18/23 2013 12/19/23 0453  WBC 6.3 9.1 6.9  HGB 13.9 12.9 12.8  HCT 40.7 38.2 37.0  MCV 87.2 88.2 86.7  PLT 179 187 193   Basic Metabolic Panel: Recent Labs  Lab 12/16/23 1432 12/18/23 2013 12/19/23 0453 12/20/23 0516 12/21/23 0625  NA 139 136 138  141 141  K 3.1* 3.1* 4.5 3.8 3.5  CL 100 100 103 105 106  CO2 29 26 24  21* 26  GLUCOSE 161* 174* 117* 122* 141*  BUN 6 <5* 5* 6 6  CREATININE 0.88 1.17* 0.93 0.78 0.70  CALCIUM 9.0 8.6* 8.8* 9.0 8.7*  MG  --   --  2.1  --   --   PHOS  --   --  3.6  --   --    GFR: Estimated Creatinine Clearance: 87.8 mL/min (by C-G formula based on SCr of 0.7 mg/dL). Liver Function Tests: Recent Labs  Lab 12/16/23 1432 12/18/23 2013  AST 16 16  ALT 13 12  ALKPHOS 87 78  BILITOT 0.8 0.6  PROT 6.4* 5.8*  ALBUMIN 4.3 4.0   No results for input(s): LIPASE, AMYLASE in the last 168 hours. No results for input(s): AMMONIA in the last 168  hours. Coagulation Profile: Recent Labs  Lab 12/19/23 0453  INR 1.0   Cardiac Enzymes: No results for input(s): CKTOTAL, CKMB, CKMBINDEX, TROPONINI in the last 168 hours. BNP (last 3 results) No results for input(s): PROBNP in the last 8760 hours. HbA1C: No results for input(s): HGBA1C in the last 72 hours. CBG: Recent Labs  Lab 12/18/23 2017  GLUCAP 156*   Lipid Profile: No results for input(s): CHOL, HDL, LDLCALC, TRIG, CHOLHDL, LDLDIRECT in the last 72 hours. Thyroid  Function Tests: No results for input(s): TSH, T4TOTAL, FREET4, T3FREE, THYROIDAB in the last 72 hours. Anemia Panel: No results for input(s): VITAMINB12, FOLATE, FERRITIN, TIBC, IRON, RETICCTPCT in the last 72 hours. Sepsis Labs: No results for input(s): PROCALCITON, LATICACIDVEN in the last 168 hours.  No results found for this or any previous visit (from the past 240 hours).       Radiology Studies: DG Ankle 2 Views Left Result Date: 12/20/2023 CLINICAL DATA:  Left ankle open reduction and internal fixation EXAM: LEFT ANKLE - 2 VIEW COMPARISON:  Left ankle radiograph dated 12/18/2023 FINDINGS: Six fluoroscopic images obtained during open reduction internal fixation of the left ankle. 1 minute 10 seconds fluoro time utilized. Radiation dose 2.97 mGy Kerma. Please see performing physicians operative report for full details. IMPRESSION: Fluoroscopic images were obtained for intraoperative guidance of open reduction internal fixation of the left ankle. Electronically Signed   By: Limin  Xu M.D.   On: 12/20/2023 17:04   DG C-Arm 1-60 Min-No Report Result Date: 12/20/2023 Fluoroscopy was utilized by the requesting physician.  No radiographic interpretation.   DG C-Arm 1-60 Min-No Report Result Date: 12/20/2023 Fluoroscopy was utilized by the requesting physician.  No radiographic interpretation.   US  OR NERVE BLOCK-IMAGE ONLY [facility]) Result Date:  12/20/2023 There is no interpretation for this exam.  This order is for images obtained during a surgical procedure.  Please See Surgeries Tab for more information regarding the procedure.        Scheduled Meds:  amitriptyline   25 mg Oral QHS   cetirizine   10 mg Oral QPM   dicyclomine   20 mg Oral TID AC   docusate  50 mg Oral QHS   doxepin   10 mg Oral QHS   fluticasone  furoate-vilanterol  1 puff Inhalation Daily   gabapentin   100 mg Oral TID   lamoTRIgine   100 mg Oral Daily   And   lamoTRIgine   200 mg Oral QHS   levothyroxine   137 mcg Oral Q0600   linaclotide   290 mcg Oral QHS   montelukast   10 mg Oral Daily  multivitamin with minerals  1 tablet Oral Daily   pantoprazole   40 mg Oral Daily   predniSONE   20 mg Oral Daily   QUEtiapine   300 mg Oral QHS   simvastatin   10 mg Oral QHS   sodium chloride  flush  3 mL Intravenous Q12H   tirzepatide   5 mg Subcutaneous Weekly   traZODone   200 mg Oral QHS   umeclidinium bromide   1 puff Inhalation Daily   venlafaxine  XR  225 mg Oral Q breakfast   Continuous Infusions:  sodium chloride  75 mL/hr at 12/21/23 0536   sodium chloride      lactated ringers  800 mL/hr at 12/20/23 1325     LOS: 2 days      Anthony CHRISTELLA Pouch, MD [facility] Pager [phone]  If 7PM-7AM, please contact night-coverage [email or web address] 12/21/2023, 9:36 AM

## 2023-12-21 NOTE — Progress Notes (Signed)
 Patient is not able to walk the distance required to go the bathroom, or he/she is unable to safely negotiate stairs required to access the bathroom.  A 3in1 BSC will alleviate this problem

## 2023-12-22 DIAGNOSIS — S82892A Other fracture of left lower leg, initial encounter for closed fracture: Secondary | ICD-10-CM | POA: Diagnosis not present

## 2023-12-22 LAB — BASIC METABOLIC PANEL WITH GFR
Anion gap: 11 (ref 5–15)
BUN: 8 mg/dL (ref 6–20)
CO2: 31 mmol/L (ref 22–32)
Calcium: 9.4 mg/dL (ref 8.9–10.3)
Chloride: 102 mmol/L (ref 98–111)
Creatinine, Ser: 0.75 mg/dL (ref 0.44–1.00)
GFR, Estimated: >60 mL/min (ref >60)
Glucose, Bld: 124 mg/dL — ABNORMAL HIGH (ref 70–99)
Potassium: 4 mmol/L (ref 3.5–5.1)
Sodium: 144 mmol/L (ref 135–145)

## 2023-12-22 NOTE — NC FL2 (Signed)
 [place]  MEDICAID FL2 LEVEL OF CARE FORM     IDENTIFICATION  Patient Name: Katie Neal Birthdate: [birth year] Sex: female Admission Date (Current Location): 12/18/2023  [facility] and [place] Number:  Chiropodist and Address:  [facility], [address] [place], [name] [address]      Provider Number: [id number]  Attending Physician Name and Address:  Trudy Anthony HERO, MD  Relative Name and Phone Number:  Linnette Aspen (Daughter)  [phone] [facility])    Current Level of Care: Hospital Recommended Level of Care: Skilled Nursing Facility Prior Approval Number:    Date Approved/Denied:   PASRR Number:    Discharge Plan: SNF    Current Diagnoses: Patient Active Problem List   Diagnosis Date Noted   Migraine 12/19/2023   Fall at home, initial encounter 12/19/2023   Depression with anxiety 12/19/2023   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   Hypokalemia 12/19/2023   AKI (acute kidney injury) 12/19/2023   Hypotension 12/19/2023   Closed left ankle fracture 12/18/2023   Elevated troponin 09/27/2019   IBS (irritable bowel syndrome) 08/07/2019   Fever    Right lower quadrant abdominal pain    Right ureteral stone 06/27/2019   Sepsis (HCC) 06/27/2019   Acute pyelonephritis 06/27/2019   Weakness    Seizure (HCC) 12/30/2018   CRPS (complex regional pain syndrome type I)    HLD (hyperlipidemia)    Lower urinary tract infectious disease    Fall    Hypothyroidism    Headache disorder 05/06/2018   Dizziness 01/30/2018   Post concussion syndrome 01/30/2018   Vasovagal syncope 11/27/2017   Seizure-like activity (HCC) 11/27/2017   Concussion with no loss of consciousness, initial encounter 10/23/2017   Excessive falling 10/23/2017   Urinary incontinence 10/23/2017   Chronic obstructive pulmonary disease (HCC) 09/09/2017   Obesity (BMI 35.0-39.9 without comorbidity) 06/07/2017   Transient neurological symptoms 05/24/2017   History  of chronic sinusitis 12/21/2016   Abnormal PFTs 12/06/2015   Adjustment disorder with depressed mood 06/17/2015   Acute urinary retention 04/05/2015   Gross hematuria 04/05/2015   Acute non-recurrent maxillary sinusitis 10/20/2014   History of environmental allergies 07/08/2014   On home oxygen therapy 07/07/2014   Gastroparesis 06/28/2014   Pharyngoesophageal dysphagia 06/01/2014   ANA positive 05/11/2014   Anemia 05/11/2014   Shortness of breath 04/25/2014   Hypoxia 04/16/2014   Hiatal hernia 04/16/2014   Former tobacco use 04/16/2014   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   Hypoxemia requiring supplemental oxygen 03/22/2014   Globus sensation 03/20/2014   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   Midepigastric pain 03/20/2014   Pneumonia due to organism 03/19/2014   Tachycardia 03/02/2014   POTS (postural orthostatic tachycardia syndrome) 03/02/2014   Pain in joint, forearm 12/07/2013   Bilateral hand pain 10/28/2013   Insomnia 09/18/2013   High risk medication use 05/27/2013   Neurogenic pain of left foot 05/07/2013   Depression 04/08/2013   Major depressive disorder, recurrent episode, moderate (HCC) 04/08/2013   Closed fracture of metatarsal bone 02/16/2013   Low back pain 02/10/2013   OA (osteoarthritis) of knee 08/27/2012   Osteopenia 06/20/2012   H/O arthroplasty 09/20/2011   ACL graft tear 08/02/2011   Sprain of medial collateral ligament of knee 08/02/2011    Orientation RESPIRATION BLADDER Height & Weight     Self, Time, Situation, Place  Normal Continent Weight: 184 lb (83.5 kg) Height:  5' 5 (165.1 cm)  BEHAVIORAL SYMPTOMS/MOOD NEUROLOGICAL BOWEL NUTRITION STATUS  Continent Diet  AMBULATORY STATUS COMMUNICATION OF NEEDS Skin   Supervision Verbally Surgical wounds                       Personal Care Assistance Level of Assistance  Bathing, Feeding, Dressing Bathing Assistance: Limited assistance Feeding assistance: Limited  assistance Dressing Assistance: Limited assistance     Functional Limitations Info             SPECIAL CARE FACTORS FREQUENCY  PT (By licensed PT), OT (By licensed OT)     PT Frequency: 7x OT Frequency: 7x            Contractures Contractures Info: Not present    Additional Factors Info  Code Status, Allergies Code Status Info: FULL Allergies Info: Codeine           Current Medications (12/22/2023):  This is the current hospital active medication list Current Facility-Administered Medications  Medication Dose Route Frequency Provider Last Rate Last Admin   0.9 %  sodium chloride  infusion  250 mL Intravenous PRN Regal, Andrew J, DPM       acetaminophen  (TYLENOL ) tablet 650 mg  650 mg Oral Q6H PRN Niu, Xilin, MD       albuterol  (PROVENTIL ) (2.5 MG/3ML) 0.083% nebulizer solution 2.5 mg  2.5 mg Nebulization Q4H PRN Dail Rankin RAMAN, RPH       amitriptyline  (ELAVIL ) tablet 25 mg  25 mg Oral QHS Niu, Xilin, MD   25 mg at 12/21/23 2159   cetirizine  (ZYRTEC ) tablet 10 mg  10 mg Oral QPM Niu, Xilin, MD   10 mg at 12/21/23 1744   dextromethorphan -guaiFENesin  (MUCINEX  DM) 30-600 MG per 12 hr tablet 1 tablet  1 tablet Oral BID PRN Niu, Xilin, MD       dicyclomine  (BENTYL ) tablet 20 mg  20 mg Oral TID AC Niu, Xilin, MD   20 mg at 12/22/23 9152   dicyclomine  (BENTYL ) tablet 20 mg  20 mg Oral TID PRN Niu, Xilin, MD       diphenhydrAMINE  (BENADRYL ) capsule 25 mg  25 mg Oral Q6H PRN Trudy Anthony HERO, MD   25 mg at 12/19/23 1140   docusate (COLACE) 50 MG/5ML liquid 50 mg  50 mg Oral QHS Belue, Nathan S, RPH   50 mg at 12/21/23 2200   doxepin  (SINEQUAN ) capsule 10 mg  10 mg Oral QHS Niu, Xilin, MD   10 mg at 12/21/23 2200   famotidine (PEPCID) tablet 20 mg  20 mg Oral BID PRN Williams, Jamiese M, MD   20 mg at 12/21/23 0740   fentaNYL  (SUBLIMAZE ) injection 50 mcg  50 mcg Intravenous Q4H PRN Trudy Anthony HERO, MD   50 mcg at 12/22/23 9470   fluticasone  (FLONASE ) 50 MCG/ACT nasal spray  2 spray  2 spray Each Nare Daily PRN Niu, Xilin, MD       fluticasone  furoate-vilanterol (BREO ELLIPTA ) 100-25 MCG/ACT 1 puff  1 puff Inhalation Daily Niu, Xilin, MD   1 puff at 12/22/23 9146   gabapentin  (NEURONTIN ) capsule 100 mg  100 mg Oral TID Niu, Xilin, MD   100 mg at 12/22/23 9150   Galcanezumab -gnlm SOAJ 120 mg  120 mg Subcutaneous Q30 days Trudy Anthony HERO, MD   120 mg at 12/21/23 1006   lamoTRIgine  (LAMICTAL ) tablet 100 mg  100 mg Oral Daily Belue, Nathan S, RPH   100 mg at 12/22/23 0848   And   lamoTRIgine  (LAMICTAL ) tablet 200 mg  200 mg Oral  QHS Dail Rankin RAMAN, RPH   200 mg at 12/21/23 2158   levothyroxine  (SYNTHROID ) tablet 137 mcg  137 mcg Oral Q0600 Niu, Xilin, MD   137 mcg at 12/22/23 0543   linaclotide  (LINZESS ) capsule 290 mcg  290 mcg Oral QHS Niu, Xilin, MD   290 mcg at 12/21/23 2203   LORazepam  (ATIVAN ) injection 2 mg  2 mg Intravenous Q2H PRN Niu, Xilin, MD       Mepolizumab  SOAJ 100 mg  100 mg Subcutaneous Q28 days Trudy Anthony HERO, MD   100 mg at 12/21/23 1006   montelukast  (SINGULAIR ) tablet 10 mg  10 mg Oral Daily Niu, Xilin, MD   10 mg at 12/22/23 0850   morphine  (MS CONTIN ) 12 hr tablet 30 mg  30 mg Oral Q12H Trudy Anthony HERO, MD   30 mg at 12/22/23 0848   multivitamin with minerals tablet 1 tablet  1 tablet Oral Daily Niu, Xilin, MD   1 tablet at 12/22/23 0850   olopatadine  (PATANOL) 0.1 % ophthalmic solution 1 drop  1 drop Both Eyes BID PRN Niu, Xilin, MD       ondansetron  (ZOFRAN ) injection 4 mg  4 mg Intravenous Q8H PRN Niu, Xilin, MD   4 mg at 12/21/23 1012   oxyCODONE  (Oxy IR/ROXICODONE ) immediate release tablet 10 mg  10 mg Oral Q4H PRN Trudy Anthony HERO, MD       pantoprazole  (PROTONIX ) EC tablet 40 mg  40 mg Oral Daily Niu, Xilin, MD   40 mg at 12/22/23 0848   predniSONE  (DELTASONE ) tablet 20 mg  20 mg Oral Daily Niu, Xilin, MD   20 mg at 12/22/23 9149   promethazine  (PHENERGAN ) tablet 12.5 mg  12.5 mg Oral Q4H PRN Magdalen Prentice PARAS, DPM        QUEtiapine  (SEROQUEL ) tablet 300 mg  300 mg Oral QHS Niu, Xilin, MD   300 mg at 12/21/23 2200   simvastatin  (ZOCOR ) tablet 10 mg  10 mg Oral QHS Niu, Xilin, MD   10 mg at 12/21/23 2204   sodium chloride  flush (NS) 0.9 % injection 3 mL  3 mL Intravenous Q12H Magdalen Prentice PARAS, DPM   3 mL at 12/22/23 0854   sodium chloride  flush (NS) 0.9 % injection 3 mL  3 mL Intravenous PRN Magdalen Prentice PARAS, DPM       tirzepatide  (ZEPBOUND ) Pen 5 mg  5 mg Subcutaneous Weekly Williams, Jamiese M, MD   5 mg at 12/21/23 1008   tiZANidine  (ZANAFLEX ) tablet 4 mg  4 mg Oral Q8H PRN Niu, Xilin, MD       traZODone  (DESYREL ) tablet 200 mg  200 mg Oral QHS Niu, Xilin, MD   200 mg at 12/21/23 2159   traZODone  (DESYREL ) tablet 25 mg  25 mg Oral QHS PRN Magdalen Prentice PARAS, DPM       umeclidinium bromide  (INCRUSE ELLIPTA ) 62.5 MCG/ACT 1 puff  1 puff Inhalation Daily Dail Rankin RAMAN, RPH   1 puff at 12/22/23 9145   venlafaxine  XR (EFFEXOR -XR) 24 hr capsule 225 mg  225 mg Oral Q breakfast Niu, Xilin, MD   225 mg at 12/22/23 0848     Discharge Medications: Please see discharge summary for a list of discharge medications.  Relevant Imaging Results:  Relevant Lab Results:   Additional Information [id number]  Boyd Litaker L Jozlin Bently, LCSW

## 2023-12-22 NOTE — TOC Progression Note (Signed)
 Transition of Care [facility]) - Progression Note    Patient Details  Name: Katie Neal MRN: [id number] Date of Birth: 06/10/1970  Transition of Care [facility]) CM/SW Contact  Estefana Taylor L [name], [name] Phone Number: 12/22/2023, 4:07 PM  Clinical Narrative:     CSW spoke with patient regarding bed choices. Patient accepted bed at [facility]. No auth needed. Per, liaison, patient can discharge to facility tomorrow morning. Patient is agreeable to this.   CSW confirmed that [facility] has not delivered DME and requested a cancellation due to patient discharging to SNF. Patient advised that she would like to go to rehab for one week only.   [facility] was scheduled and arranged for transportation at 1pm 12/1. Per attending, patient is medically stable for discharge.                     Expected Discharge Plan and Services                                               Social Drivers of Health (SDOH) Interventions SDOH Screenings   Food Insecurity: No Food Insecurity (12/19/2023)  Housing: Unknown (12/19/2023)  Transportation Needs: No Transportation Needs (12/19/2023)  Tobacco Use: Medium Risk (12/16/2023)   Received from [facility]    Readmission Risk Interventions    12/21/2023    4:00 PM  Readmission Risk Prevention Plan  Transportation Screening Complete  Medication Review (RN Care Manager) Complete  PCP or Specialist appointment within 3-5 days of discharge Complete  HRI or Home Care Consult Complete  SW Recovery Care/Counseling Consult Complete  Palliative Care Screening Not Applicable  Skilled Nursing Facility Not Applicable

## 2023-12-22 NOTE — Plan of Care (Signed)

## 2023-12-22 NOTE — Progress Notes (Signed)
 PROGRESS NOTE    NERY KALISZ  FMW:[id number] DOB: [birth year] DOA: 12/18/2023 PCP: [facility]   Assessment & Plan:   Principal Problem:   Closed left ankle fracture Active Problems:   CRPS (complex regional pain syndrome type I)   Fall at home, initial encounter   COPD with asthma (HCC)   HLD (hyperlipidemia)   Hypothyroidism   Seizure (HCC)   Hypokalemia   IBS (irritable bowel syndrome)   AKI (acute kidney injury)   Hypotension   Migraine   Depression with anxiety   Obesity (BMI 35.0-39.9 without comorbidity)  Assessment and Plan: Closed left ankle fracture: CT-left ankle showed Trimalleolar left ankle fracture. S/p ORIF 12/20/23 as per podiatry. Non weightbearing of LLE as per podiatry. MS contin , oxy & fentanyl  prn. Better controlled pain today. Podiatry following and recs apprec  Fall: OT recs HH. PT recs SNF. Pt wants to go to SNF    Complex regional pain syndrome: w/ acute pain secondary to above. D/c levorphanol as it is not on hospital formulary. Continue on MS contin  & continue on fentanyl  prn. Continue on home dose of gabapentin . Pain better controlled today   COPD with asthma: w/o exacerbation. Unknown stage and/or severity. Bronchodilators prn    HLD: continue on statin   Hypothyroidism: continue on levothyroxine     Seizure: continue on home dose of lamictal . Ativan  prn for seizure   Hypokalemia: WNL today    IBS: bentyl  prn    AKI: resolved    Hypotension: resolved   Migraine: on emgality  qmonthly    Depression: severity unknown. Continue on home dose of venlafaxine      Obesity: BMI 31.6. Would benefit from weight loss. On Zepbound           DVT prophylaxis: SCDs Code Status: full  Family Communication:  Disposition Plan: d/c to SNF. Needs SNF placement   Level of care: Med-Surg Consultants:  podiatry  Procedures:   Antimicrobials:    Subjective: Pt c/o fatigue   Objective: Vitals:   12/21/23 1642 12/21/23  1949 12/22/23 0516 12/22/23 0751  BP: (!) 141/87 (!) 160/89 129/88 123/84  Pulse: 81 81 76 74  Resp: 16 18 18 16   Temp: 98 F (36.7 C) 98.6 F (37 C) 98.1 F (36.7 C) 97.9 F (36.6 C)  TempSrc: Oral     SpO2: 98% 96% 99% 100%  Weight:      Height:        Intake/Output Summary (Last 24 hours) at 12/22/2023 0851 Last data filed at 12/22/2023 0515 Gross per 24 hour  Intake 120 ml  Output 400 ml  Net -280 ml   Filed Weights   12/20/23 1059  Weight: 83.5 kg    Examination:  General exam: appears comfortable  Respiratory system: clear breath sounds b/l  Cardiovascular system: S1 & S2+ Gastrointestinal system: abd is soft, NT, obese & hypoactive bowel sounds Central nervous system: alert & oriented. Moves all extremities Psychiatry: judgement and insight appears at baseline. Appropriate mood and affect     Data Reviewed: I have personally reviewed following labs and imaging studies  CBC: Recent Labs  Lab 12/16/23 1432 12/18/23 2013 12/19/23 0453  WBC 6.3 9.1 6.9  HGB 13.9 12.9 12.8  HCT 40.7 38.2 37.0  MCV 87.2 88.2 86.7  PLT 179 187 193   Basic Metabolic Panel: Recent Labs  Lab 12/18/23 2013 12/19/23 0453 12/20/23 0516 12/21/23 0625 12/22/23 0551  NA 136 138 141 141 144  K 3.1* 4.5 3.8 3.5 4.0  CL 100 103 105 106 102  CO2 26 24 21* 26 31  GLUCOSE 174* 117* 122* 141* 124*  BUN <5* 5* 6 6 8   CREATININE 1.17* 0.93 0.78 0.70 0.75  CALCIUM 8.6* 8.8* 9.0 8.7* 9.4  MG  --  2.1  --   --   --   PHOS  --  3.6  --   --   --    GFR: Estimated Creatinine Clearance: 87.8 mL/min (by C-G formula based on SCr of 0.75 mg/dL). Liver Function Tests: Recent Labs  Lab 12/16/23 1432 12/18/23 2013  AST 16 16  ALT 13 12  ALKPHOS 87 78  BILITOT 0.8 0.6  PROT 6.4* 5.8*  ALBUMIN 4.3 4.0   No results for input(s): LIPASE, AMYLASE in the last 168 hours. No results for input(s): AMMONIA in the last 168 hours. Coagulation Profile: Recent Labs  Lab  12/19/23 0453  INR 1.0   Cardiac Enzymes: No results for input(s): CKTOTAL, CKMB, CKMBINDEX, TROPONINI in the last 168 hours. BNP (last 3 results) No results for input(s): PROBNP in the last 8760 hours. HbA1C: No results for input(s): HGBA1C in the last 72 hours. CBG: Recent Labs  Lab 12/18/23 2017  GLUCAP 156*   Lipid Profile: No results for input(s): CHOL, HDL, LDLCALC, TRIG, CHOLHDL, LDLDIRECT in the last 72 hours. Thyroid  Function Tests: No results for input(s): TSH, T4TOTAL, FREET4, T3FREE, THYROIDAB in the last 72 hours. Anemia Panel: No results for input(s): VITAMINB12, FOLATE, FERRITIN, TIBC, IRON, RETICCTPCT in the last 72 hours. Sepsis Labs: No results for input(s): PROCALCITON, LATICACIDVEN in the last 168 hours.  No results found for this or any previous visit (from the past 240 hours).       Radiology Studies: DG Ankle 2 Views Left Result Date: 12/20/2023 CLINICAL DATA:  Left ankle open reduction and internal fixation EXAM: LEFT ANKLE - 2 VIEW COMPARISON:  Left ankle radiograph dated 12/18/2023 FINDINGS: Six fluoroscopic images obtained during open reduction internal fixation of the left ankle. 1 minute 10 seconds fluoro time utilized. Radiation dose 2.97 mGy Kerma. Please see performing physicians operative report for full details. IMPRESSION: Fluoroscopic images were obtained for intraoperative guidance of open reduction internal fixation of the left ankle. Electronically Signed   By: Limin  Xu M.D.   On: 12/20/2023 17:04   DG C-Arm 1-60 Min-No Report Result Date: 12/20/2023 Fluoroscopy was utilized by the requesting physician.  No radiographic interpretation.   DG C-Arm 1-60 Min-No Report Result Date: 12/20/2023 Fluoroscopy was utilized by the requesting physician.  No radiographic interpretation.   US  OR NERVE BLOCK-IMAGE ONLY [facility]) Result Date: 12/20/2023 There is no interpretation for this exam.  This  order is for images obtained during a surgical procedure.  Please See Surgeries Tab for more information regarding the procedure.        Scheduled Meds:  amitriptyline   25 mg Oral QHS   cetirizine   10 mg Oral QPM   dicyclomine   20 mg Oral TID AC   docusate  50 mg Oral QHS   doxepin   10 mg Oral QHS   fluticasone  furoate-vilanterol  1 puff Inhalation Daily   gabapentin   100 mg Oral TID   Galcanezumab -gnlm  120 mg Subcutaneous Q30 days   lamoTRIgine   100 mg Oral Daily   And   lamoTRIgine   200 mg Oral QHS   levothyroxine   137 mcg Oral Q0600   linaclotide   290 mcg Oral QHS   Mepolizumab  100 mg Subcutaneous Q28 days  montelukast   10 mg Oral Daily   morphine   30 mg Oral Q12H   multivitamin with minerals  1 tablet Oral Daily   pantoprazole   40 mg Oral Daily   predniSONE   20 mg Oral Daily   QUEtiapine   300 mg Oral QHS   simvastatin   10 mg Oral QHS   sodium chloride  flush  3 mL Intravenous Q12H   tirzepatide   5 mg Subcutaneous Weekly   traZODone   200 mg Oral QHS   umeclidinium bromide   1 puff Inhalation Daily   venlafaxine  XR  225 mg Oral Q breakfast   Continuous Infusions:  sodium chloride        LOS: 3 days      Anthony CHRISTELLA Pouch, MD [facility] Pager [phone]  If 7PM-7AM, please contact night-coverage [email or web address] 12/22/2023, 8:51 AM

## 2023-12-22 NOTE — Plan of Care (Signed)
   Problem: Education: Goal: Knowledge of General Education information will improve Description Including pain rating scale, medication(s)/side effects and non-pharmacologic comfort measures Outcome: Progressing

## 2023-12-22 NOTE — Progress Notes (Signed)
 Subjective:  Patient ID: Katie Neal, female    DOB: [birth year],  MRN: [id number]  Chief Complaint  Patient presents with   Dizziness   Fall    DOS: 12/20/23 Procedure: left ankle trimalleolar ORIF  53 y.o. female with past medical history of CRPS, dizziness was evaluated as an inpatient status post left ankle trimalleolar ORIF 12/20/2023.  Patient is doing well in her recovery.  She initially had issues with pain control the first night after surgery.  Since that time, pain has been much better controlled.  No complications at this time.  Review of Systems: Negative except as noted in the HPI. Denies N/V/F/Ch.  Past Medical History:  Diagnosis Date   Arthritis    Asthma    Cardiomegaly    Cerebral atrophy    Complex regional pain syndrome type 1    COPD (chronic obstructive pulmonary disease) (HCC)    CRPS (complex regional pain syndrome type I)    Depression    Diaphragmatic hernia    Dyspnea    Excessive falling    Headache    History of chronic sinusitis    History of kidney stones    Hyperlipidemia    Hypothyroidism    IBS (irritable bowel syndrome)    Insomnia    Memory difficulty    Osteoarthritis of knee    Pneumonia    Post concussion syndrome    PTSD (post-traumatic stress disorder)    Seizure-like activity (HCC)    Sleep apnea    not on Cpap machine but is on oxygen    Urinary incontinence    Vasovagal syncope     Current Facility-Administered Medications:    0.9 %  sodium chloride  infusion, 250 mL, Intravenous, PRN, Weslie Pretlow, Prentice PARAS, DPM   acetaminophen  (TYLENOL ) tablet 650 mg, 650 mg, Oral, Q6H PRN, Niu, Xilin, MD   albuterol  (PROVENTIL ) (2.5 MG/3ML) 0.083% nebulizer solution 2.5 mg, 2.5 mg, Nebulization, Q4H PRN, Dail Rankin RAMAN, RPH   amitriptyline  (ELAVIL ) tablet 25 mg, 25 mg, Oral, QHS, Niu, Xilin, MD, 25 mg at 12/21/23 2159   cetirizine  (ZYRTEC ) tablet 10 mg, 10 mg, Oral, QPM, Niu, Xilin, MD, 10 mg at 12/21/23 1744    dextromethorphan -guaiFENesin  (MUCINEX  DM) 30-600 MG per 12 hr tablet 1 tablet, 1 tablet, Oral, BID PRN, Niu, Xilin, MD   dicyclomine  (BENTYL ) tablet 20 mg, 20 mg, Oral, TID AC, Niu, Xilin, MD, 20 mg at 12/22/23 0847   dicyclomine  (BENTYL ) tablet 20 mg, 20 mg, Oral, TID PRN, Niu, Xilin, MD   diphenhydrAMINE  (BENADRYL ) capsule 25 mg, 25 mg, Oral, Q6H PRN, Trudy Anthony HERO, MD, 25 mg at 12/19/23 1140   docusate (COLACE) 50 MG/5ML liquid 50 mg, 50 mg, Oral, QHS, Belue, Rankin RAMAN, RPH, 50 mg at 12/21/23 2200   doxepin  (SINEQUAN ) capsule 10 mg, 10 mg, Oral, QHS, Niu, Xilin, MD, 10 mg at 12/21/23 2200   famotidine (PEPCID) tablet 20 mg, 20 mg, Oral, BID PRN, Trudy Anthony HERO, MD, 20 mg at 12/21/23 0740   fentaNYL  (SUBLIMAZE ) injection 50 mcg, 50 mcg, Intravenous, Q4H PRN, Trudy Anthony HERO, MD, 50 mcg at 12/22/23 9470   fluticasone  (FLONASE ) 50 MCG/ACT nasal spray 2 spray, 2 spray, Each Nare, Daily PRN, Niu, Xilin, MD   fluticasone  furoate-vilanterol (BREO ELLIPTA ) 100-25 MCG/ACT 1 puff, 1 puff, Inhalation, Daily, Niu, Xilin, MD, 1 puff at 12/22/23 0853   gabapentin  (NEURONTIN ) capsule 100 mg, 100 mg, Oral, TID, Niu, Xilin, MD, 100 mg at 12/22/23 0849   Galcanezumab -gnlm SOAJ  120 mg, 120 mg, Subcutaneous, Q30 days, Trudy, Jamiese M, MD, 120 mg at 12/21/23 1006   lamoTRIgine  (LAMICTAL ) tablet 100 mg, 100 mg, Oral, Daily, 100 mg at 12/22/23 0848 **AND** lamoTRIgine  (LAMICTAL ) tablet 200 mg, 200 mg, Oral, QHS, Belue, Rankin RAMAN, RPH, 200 mg at 12/21/23 2158   levothyroxine  (SYNTHROID ) tablet 137 mcg, 137 mcg, Oral, Q0600, Niu, Xilin, MD, 137 mcg at 12/22/23 0543   linaclotide  (LINZESS ) capsule 290 mcg, 290 mcg, Oral, QHS, Niu, Xilin, MD, 290 mcg at 12/21/23 2203   LORazepam  (ATIVAN ) injection 2 mg, 2 mg, Intravenous, Q2H PRN, Niu, Xilin, MD   Mepolizumab  SOAJ 100 mg, 100 mg, Subcutaneous, Q28 days, Trudy, Jamiese M, MD, 100 mg at 12/21/23 1006   montelukast  (SINGULAIR ) tablet 10 mg, 10 mg, Oral,  Daily, Niu, Xilin, MD, 10 mg at 12/22/23 0850   morphine  (MS CONTIN ) 12 hr tablet 30 mg, 30 mg, Oral, Q12H, Trudy Anthony HERO, MD, 30 mg at 12/22/23 0848   multivitamin with minerals tablet 1 tablet, 1 tablet, Oral, Daily, Niu, Xilin, MD, 1 tablet at 12/22/23 0850   olopatadine  (PATANOL) 0.1 % ophthalmic solution 1 drop, 1 drop, Both Eyes, BID PRN, Niu, Xilin, MD   ondansetron  (ZOFRAN ) injection 4 mg, 4 mg, Intravenous, Q8H PRN, Niu, Xilin, MD, 4 mg at 12/21/23 1012   oxyCODONE  (Oxy IR/ROXICODONE ) immediate release tablet 10 mg, 10 mg, Oral, Q4H PRN, Trudy Anthony HERO, MD   pantoprazole  (PROTONIX ) EC tablet 40 mg, 40 mg, Oral, Daily, Niu, Xilin, MD, 40 mg at 12/22/23 9151   predniSONE  (DELTASONE ) tablet 20 mg, 20 mg, Oral, Daily, Niu, Xilin, MD, 20 mg at 12/22/23 9149   promethazine  (PHENERGAN ) tablet 12.5 mg, 12.5 mg, Oral, Q4H PRN, Cadince Hilscher, Prentice PARAS, DPM   QUEtiapine  (SEROQUEL ) tablet 300 mg, 300 mg, Oral, QHS, Niu, Xilin, MD, 300 mg at 12/21/23 2200   simvastatin  (ZOCOR ) tablet 10 mg, 10 mg, Oral, QHS, Niu, Xilin, MD, 10 mg at 12/21/23 2204   sodium chloride  flush (NS) 0.9 % injection 3 mL, 3 mL, Intravenous, Q12H, Reiss Mowrey, Prentice PARAS, DPM, 3 mL at 12/22/23 0854   sodium chloride  flush (NS) 0.9 % injection 3 mL, 3 mL, Intravenous, PRN, Jashan Cotten J, DPM   tirzepatide  (ZEPBOUND ) Pen 5 mg, 5 mg, Subcutaneous, Weekly, Trudy Anthony HERO, MD, 5 mg at 12/21/23 1008   tiZANidine  (ZANAFLEX ) tablet 4 mg, 4 mg, Oral, Q8H PRN, Niu, Xilin, MD   traZODone  (DESYREL ) tablet 200 mg, 200 mg, Oral, QHS, Niu, Xilin, MD, 200 mg at 12/21/23 2159   traZODone  (DESYREL ) tablet 25 mg, 25 mg, Oral, QHS PRN, Marcelle Bebout, Prentice PARAS, DPM   umeclidinium bromide  (INCRUSE ELLIPTA ) 62.5 MCG/ACT 1 puff, 1 puff, Inhalation, Daily, Belue, Rankin RAMAN, RPH, 1 puff at 12/22/23 0854   venlafaxine  XR (EFFEXOR -XR) 24 hr capsule 225 mg, 225 mg, Oral, Q breakfast, Niu, Xilin, MD, 225 mg at 12/22/23 0848  Social History   Tobacco Use   Smoking Status Former   Current packs/day: 0.00   Types: Cigarettes   Quit date: 10/22/2013   Years since quitting: 10.1  Smokeless Tobacco Never    Allergies  Allergen Reactions   Codeine Itching    Can take with benadryl     Objective:   Vitals:   12/22/23 0516 12/22/23 0751  BP: 129/88 123/84  Pulse: 76 74  Resp: 18 16  Temp: 98.1 F (36.7 C) 97.9 F (36.6 C)  SpO2: 99% 100%   Body mass index is 30.62 kg/m.  Objective: -  Left foot and ankle dressing is intact.  Clean, dry.  No strikethrough.  Patient able to move toes.  CFT intact.  Radiographs: Fluoroscopic intraoperative photos of the left ankle were taken that show open reduction internal fixation of the trimalleolar ankle fracture.  There are 2 screws present in the medial malleolus, plate and screw fixation present in the fibula.  No complications identified. Assessment:   1. Closed trimalleolar fracture of left ankle, initial encounter   2. Dizziness    Plan:  Patient was evaluated and treated and all questions answered. Doing well in post operative recovery.   S/p Left ankle surgery  -Progressing as expected post-operatively.  Fortunately, her pain has been controlled with history of CRPS.  We discussed plan for discharge.  I have called in oxycodone /acetaminophen  7.5/325 X 28 tabs every 6 hours as needed, aspirin  81 mg twice daily, vitamin D  50,000 IU once weekly for her discharge medications.  She understands that the pain medication is for acute postoperative pain.  She will take it as needed. -XR: As above, no complications identified -WB Status: Non weight bearing in CAM walker. -Medications: as above -XR at next visit: pre-clinical left ankle XR  Patient is stable for discharge from podiatric standpoint.  Strict nonweightbearing approximately 4 weeks pending progress.  Our clinic will call tomorrow morning to set up a follow-up appointment for 1 week postoperative.  Prentice Ovens, DPM

## 2023-12-22 NOTE — Care Management Important Message (Signed)
 Important Message  Patient Details  Name: Katie Neal MRN: [id number] Date of Birth: 03/18/1970   Important Message Given:  Yes - Medicare IM     Anderson Coppock W, CMA 12/22/2023, 1:56 PM

## 2023-12-22 NOTE — Plan of Care (Signed)
   Problem: Activity: Goal: Risk for activity intolerance will decrease Outcome: Progressing   Problem: Skin Integrity: Goal: Risk for impaired skin integrity will decrease Outcome: Progressing

## 2023-12-22 NOTE — Progress Notes (Addendum)
 Physical Therapy Treatment Patient Details Name: Katie Neal MRN: [id number] DOB: 02-13-1970 Today's Date: 12/22/2023   History of Present Illness Pt is a 53 y/o F admitted on 12/18/23 after presenting with c/o fall resulting in LLE pain. Pt found to have closed L ankle fx. Pt is s/p L ORIF 12/20/23. PMH: migraine HAs, CRPS, HLD, asthma, GERD, hypothyroidism, depression, anxiety, PTSD, IBS, insomnia, seizure, cardiomegaly, cerebral atrophy, COPD    PT Comments  Pt in bed, ready for session.  She dons CAM boot with good knowledge but light assist to save on time.  She is ind in bed mobility. Steady in sitting EOB.  She refuses grip sock or shoe on R foot and opts to go barefoot despite education for safety.  She is able to stand pivot to wheelchair at bedside with one hand on chair and one on counter for support.  She has good knowledge of chair and safety features.  She transfers back to bed then to/from [facility] with cga/min a x 1.  She does struggle some with care and managing clothing in standing.   Discussed discharge plan.  Pt stated she has some fear over mobility at home on her own.  Stated family works during the day and she will be on her own.  Pt does ask about rehab and if it is available to her.  Will reach out to [facility] as pt does have a history of falls and would benefit from increased strength and balance to increase safety at home.  If it is unavailable to her, she would benefit from +1 assist for mobiltiy.  She will need DME prior to discharge as she is uanble to walk household distances and would rely on wheelchair for mobility.     Patient suffers from NWB L ankle fx which impairs his/her ability to perform daily activities like toileting, feeding, dressing, grooming, bathing in the home. A cane, walker, crutch will not resolve the patient's issue with performing activities of daily living. A lightweight wheelchair and cushion is required/recommended and will allow patient to safely  perform daily activities.   Patient can safely propel the wheelchair in the home or has a caregiver who can provide assistance.     If plan is discharge home, recommend the following: A little help with walking and/or transfers;A little help with bathing/dressing/bathroom;Assistance with cooking/housework;Assist for transportation;Help with stairs or ramp for entrance   Can travel by private vehicle        Equipment Recommendations  Wheelchair (measurements PT);Hospital bed;BSC/3in1;Wheelchair cushion (measurements PT)    Recommendations for Other Services       Precautions / Restrictions Precautions Precautions: Fall Recall of Precautions/Restrictions: Intact Precaution/Restrictions Comments: LLE cam boot Restrictions Weight Bearing Restrictions Per Provider Order: Yes LLE Weight Bearing Per Provider Order: Non weight bearing     Mobility  Bed Mobility Overal bed mobility: Modified Independent               Patient Response: Cooperative  Transfers Overall transfer level: Needs assistance Equipment used: None Transfers: Bed to chair/wheelchair/BSC   Stand pivot transfers: Contact guard assist, Min assist         General transfer comment: maintained NWBing    Ambulation/Gait         Gait velocity: dec     General Gait Details: unable   Stairs             Wheelchair Mobility     Tilt Bed Tilt Bed Patient Response: Cooperative  Modified  Rankin (Stroke Patients Only)       Balance Overall balance assessment: Needs assistance Sitting-balance support: Feet supported Sitting balance-Leahy Scale: Good     Standing balance support: Single extremity supported Standing balance-Leahy Scale: Poor Standing balance comment: at least one hand held assist at all times needed.  struggles with self care in standing and managing clothing after toiletting.                            Communication Communication Communication: No  apparent difficulties  Cognition Arousal: Alert Behavior During Therapy: WFL for tasks assessed/performed   PT - Cognitive impairments: No apparent impairments                       PT - Cognition Comments: appears to have good understanding of situation Following commands: Intact      Cueing Cueing Techniques: Verbal cues  Exercises Other Exercises Other Exercises: HEP review and education    General Comments        Pertinent Vitals/Pain Pain Assessment Pain Assessment: Faces Faces Pain Scale: Hurts little more Pain Location: L foot, back Pain Descriptors / Indicators: Sore, Aching Pain Intervention(s): Monitored during session, Limited activity within patient's tolerance    Home Living                          Prior Function            PT Goals (current goals can now be found in the care plan section) Progress towards PT goals: Progressing toward goals    Frequency    7X/week      PT Plan      Co-evaluation              AM-PAC PT 6 Clicks Mobility   Outcome Measure  Help needed turning from your back to your side while in a flat bed without using bedrails?: None Help needed moving from lying on your back to sitting on the side of a flat bed without using bedrails?: None Help needed moving to and from a bed to a chair (including a wheelchair)?: A Little Help needed standing up from a chair using your arms (e.g., wheelchair or bedside chair)?: A Little Help needed to walk in hospital room?: Total Help needed climbing 3-5 steps with a railing? : Total 6 Click Score: 16    End of Session Equipment Utilized During Treatment: Gait belt Activity Tolerance: Patient tolerated treatment well Patient left: in bed;with call bell/phone within reach;with bed alarm set Nurse Communication: Weight bearing status PT Visit Diagnosis: History of falling (Z91.81);Difficulty in walking, not elsewhere classified (R26.2);Other abnormalities of  gait and mobility (R26.89);Muscle weakness (generalized) (M62.81)     Time: 9051-8997 PT Time Calculation (min) (ACUTE ONLY): 14 min  Charges:    $Therapeutic Activity: 8-22 mins PT General Charges $$ ACUTE PT VISIT: 1 Visit                   Lauraine Gills, PTA 12/22/23, 10:44 AM

## 2023-12-23 DIAGNOSIS — S82892A Other fracture of left lower leg, initial encounter for closed fracture: Secondary | ICD-10-CM | POA: Diagnosis not present

## 2023-12-23 LAB — BASIC METABOLIC PANEL WITH GFR
Anion gap: 9 (ref 5–15)
BUN: 8 mg/dL (ref 6–20)
CO2: 30 mmol/L (ref 22–32)
Calcium: 8.7 mg/dL — ABNORMAL LOW (ref 8.9–10.3)
Chloride: 102 mmol/L (ref 98–111)
Creatinine, Ser: 0.67 mg/dL (ref 0.44–1.00)
GFR, Estimated: >60 mL/min (ref >60)
Glucose, Bld: 126 mg/dL — ABNORMAL HIGH (ref 70–99)
Potassium: 3.5 mmol/L (ref 3.5–5.1)
Sodium: 141 mmol/L (ref 135–145)

## 2023-12-23 MED ORDER — LINACLOTIDE 290 MCG PO CAPS: 290.0000 ug | ORAL_CAPSULE | Freq: Every day | ORAL | 0 refills | Status: DC

## 2023-12-23 MED ORDER — LEVORPHANOL TARTRATE 2 MG PO TABS: 2.0000 mg | ORAL_TABLET | Freq: Three times a day (TID) | ORAL | 0 refills | Status: AC | PRN

## 2023-12-23 MED ORDER — FENTANYL 12 MCG/HR TD PT72
1.0000 | MEDICATED_PATCH | TRANSDERMAL | Status: DC
  Administered 2023-12-23: 1 via TRANSDERMAL
  Filled 2023-12-23: qty 1

## 2023-12-23 MED ORDER — NUCALA 100 MG/ML ~~LOC~~ SOAJ: 100.0000 mg | SUBCUTANEOUS | Status: AC

## 2023-12-23 MED ORDER — LEVORPHANOL TARTRATE 2 MG PO TABS: 2.0000 mg | ORAL_TABLET | Freq: Three times a day (TID) | ORAL | 0 refills | Status: DC | PRN

## 2023-12-23 MED ORDER — LINACLOTIDE 290 MCG PO CAPS: 290.0000 ug | ORAL_CAPSULE | Freq: Every day | ORAL | 0 refills | Status: AC

## 2023-12-23 MED ORDER — FENTANYL 12 MCG/HR TD PT72: 1.0000 | MEDICATED_PATCH | TRANSDERMAL | 0 refills | Status: AC

## 2023-12-23 NOTE — Anesthesia Postprocedure Evaluation (Addendum)
 Anesthesia Post Note  Patient: Katie Neal  Procedure(s) Performed: OPEN REDUCTION INTERNAL FIXATION (ORIF) ANKLE FRACTURE (Left: Ankle)  Patient location during evaluation: PACU Anesthesia Type: General Level of consciousness: awake and alert Pain management: pain level controlled Vital Signs Assessment: post-procedure vital signs reviewed and stable Respiratory status: spontaneous breathing, nonlabored ventilation, respiratory function stable and patient connected to nasal cannula oxygen Cardiovascular status: blood pressure returned to baseline and stable Postop Assessment: no apparent nausea or vomiting Anesthetic complications: no   No notable events documented.   Last Vitals:  Vitals:   12/23/23 0446 12/23/23 0800  BP: 104/80 116/82  Pulse: 73 78  Resp: 16 17  Temp: (!) 36.2 C 36.7 C  SpO2: 98% 99%    Last Pain:  Vitals:   12/23/23 1156  TempSrc:   PainSc: 5                  Camellia Merilee Louder

## 2023-12-23 NOTE — Discharge Summary (Addendum)
 Physician Discharge Summary  Katie Neal FMW:[id number] DOB: [birth year] DOA: 12/18/2023  PCP: [facility]  Admit date: 12/18/2023 Discharge date: 12/24/23  Admitted From: home Disposition:  SNF  Recommendations for Outpatient Follow-up:  Follow up with PCP in 1-2 weeks F/u w/ podiatry, Dr. Magdalen, in 1 week Non weightbearing of LLE approx 4 weeks as per podiatry  Home Health: no  Equipment/Devices: chronically uses 3L [place]  Discharge Condition: stable  CODE STATUS: full  Diet recommendation:  Regular   Brief/Interim Summary: HPI was taken from Dr. Hilma: Katie Neal is a 53 y.o. female with medical history significant of migraine headaches, CRPS (complex regional pain syndrome type I, involving internal organs including bladder), hyperlipidemia, asthma, GERD, hypothyroidism, depression with anxiety, PTSD, IBS, , insomnia, seizure, who presents with Dizziness, fall, left ankle pain.   Pt states that due to hx of CRPS, she has intermittent dizziness. Today she was being assisted by her son to the bathroom when she began to feel dizzy and fell, hurting her left ankle.  Unsure if she hit her head.  No LOC.  She developed the pain in the left ankle, which is constant, sharp, severe, nonradiating, aggravated by movement.  Patient has nausea, no vomiting, diarrhea or abdominal pain.  No symptoms of UTI. Patient states that because of SOB and wheezing, her doctor put her on prednisone  and Z-Pak since yesterday.  She still has mild SOB and dry cough, no chest pain, fever or chills.   Patient was initially hypotensive with blood pressure 79/58, which improved to 108/68 after giving 1 L normal saline in ED.  Patient states that she is taking metoprolol  for tachycardia at home.   Data reviewed independently and ED Course: pt was found to have WBC 9.1, potassium 3.1, troponin<15, mild AKI.  Temperature normal, heart rate 74, RR 17, oxygen saturation 99% on room air.  CT of head  negative for acute intracranial abnormalities. CT-left ankle showed Trimalleolar left ankle fracture.  Patient is admitted to telemetry bed as inpatient.  Dr. Magdalen of podiatry is consulted.  Discharge Diagnoses:  Principal Problem:   Closed left ankle fracture Active Problems:   CRPS (complex regional pain syndrome type I)   Fall at home, initial encounter   COPD with asthma (HCC)   HLD (hyperlipidemia)   Hypothyroidism   Seizure (HCC)   Hypokalemia   IBS (irritable bowel syndrome)   AKI (acute kidney injury)   Hypotension   Migraine   Depression with anxiety   Obesity (BMI 35.0-39.9 without comorbidity) Closed left ankle fracture: CT-left ankle showed Trimalleolar left ankle fracture. S/p ORIF 12/20/23 as per podiatry. Non weightbearing of LLE as per podiatry.  Restart home dose of levorphanol and continue on percocet prn. Podiatry following and recs apprec   Fall: OT recs HH. PT recs SNF. Pt wants to go to SNF    Complex regional pain syndrome: w/ acute pain secondary to above. Restart home dose of levorphanol and continue on percocet. Continue on home dose of gabapentin .   COPD with asthma: w/o exacerbation. Unknown stage and/or severity. Bronchodilators prn   Chronic hypoxic respiratory failure: continue on supplemental oxygen, 3L [place] chronically    HLD: continue on statin    Hypothyroidism: continue on levothyroxine     Seizure: continue on home dose of lamictal .    Hypokalemia: WNL today    IBS: bentyl  prn    AKI: resolved    Hypotension: resolved   Migraine: on emgality  qmonthly  Depression: severity unknown. Continue on home dose of venlafaxine     Obesity: BMI 31.6. Would benefit from weight loss. On Zepbound       Discharge Instructions  Discharge Instructions     Diet general   Complete by: As directed    Discharge instructions   Complete by: As directed    F/u w/ PCP in 1-2 weeks. F/u w/ podiatry, Dr. Magdalen, in 1 week. Nonweightbearing of LLE as  per podiatry   Increase activity slowly   Complete by: As directed    No wound care   Complete by: As directed       Allergies as of 12/23/2023       Reactions   Codeine Itching   Can take with benadryl         Medication List     STOP taking these medications    azithromycin  250 MG tablet Commonly known as: ZITHROMAX    BENRALIZUMAB [place]   hydrOXYzine  25 MG capsule Commonly known as: VISTARIL    ipratropium 0.03 % nasal spray Commonly known as: ATROVENT    lidocaine  5 % Commonly known as: LIDODERM    meclizine  25 MG tablet Commonly known as: ANTIVERT        TAKE these medications    albuterol  (2.5 MG/3ML) 0.083% nebulizer solution Commonly known as: PROVENTIL  Take 2.5 mg by nebulization every 6 (six) hours as needed for wheezing or shortness of breath.   Ventolin  HFA 108 (90 Base) MCG/ACT inhaler Generic drug: albuterol  Inhale 2 puffs into the lungs every 6 (six) hours as needed for wheezing.   amitriptyline  25 MG tablet Commonly known as: ELAVIL  Take 25 mg by mouth at bedtime.   ascorbic acid  500 MG tablet Commonly known as: VITAMIN C Take 1,000 mg by mouth daily.   aspirin  EC 81 MG tablet Take 1 tablet (81 mg total) by mouth 2 (two) times daily. Swallow whole.   budesonide 0.25 MG/2ML nebulizer solution Commonly known as: PULMICORT Take 0.25 mg by nebulization 2 (two) times daily.   budesonide-formoterol  80-4.5 MCG/ACT inhaler Commonly known as: SYMBICORT Inhale 2 puffs into the lungs 2 (two) times daily.   dicyclomine  20 MG tablet Commonly known as: BENTYL  Take 20 mg by mouth 3 (three) times daily before meals.   doxepin  10 MG capsule Commonly known as: SINEQUAN  Take 10 mg by mouth at bedtime as needed.   Emgality  120 MG/ML [place] Generic drug: Galcanezumab -gnlm Inject 120 mg into the skin every 30 (thirty) days.   fluticasone  50 MCG/ACT nasal spray Commonly known as: FLONASE  Place 2 sprays into both nostrils daily as needed.    gabapentin  100 MG capsule Commonly known as: NEURONTIN  Take 100 mg by mouth 3 (three) times daily.   hydrOXYzine  25 MG tablet Commonly known as: ATARAX  Take 25 mg by mouth 3 (three) times daily as needed.   lamoTRIgine  100 MG tablet Commonly known as: LAMICTAL  Take 100-200 mg by mouth See admin instructions. Take 1 tablet (100mg ) by mouth every morning and take 2 tablets (200mg ) by mouth at bedtime   levocetirizine 5 MG tablet Commonly known as: XYZAL  SMARTSIG:1 Tablet(s) By Mouth Every Evening   levorphanol 2 MG tablet Commonly known as: LEVODROMORAN Take 1 tablet (2 mg total) by mouth every 8 (eight) hours as needed for up to 1 day for pain.   levothyroxine  137 MCG tablet Commonly known as: SYNTHROID  Take 1 tablet by mouth 6 days/week. Take half tablet (68.5 mcg) on Sunday. Take on an empty stomach with a glass of water at least  30-60 minutes before breakfast.   linaclotide  290 MCG Caps capsule Commonly known as: LINZESS  Take 1 capsule (290 mcg total) by mouth at bedtime for 1 day.   metoprolol  succinate 25 MG 24 hr tablet Commonly known as: TOPROL -XL Take 25 mg by mouth daily.   montelukast  10 MG tablet Commonly known as: SINGULAIR  Take 10 mg by mouth daily.   multivitamin with minerals Tabs tablet Take 1 tablet by mouth daily.   Nucala  100 MG/ML Soaj Generic drug: Mepolizumab  Inject 1 mL (100 mg total) into the skin every 28 (twenty-eight) days. Start taking on: January 18, 2024   Olopatadine  HCl 0.7 % Soln Place 1 drop into both eyes 2 (two) times daily as needed (allergy symptoms).   ondansetron  4 MG disintegrating tablet Commonly known as: ZOFRAN -ODT Take 4 mg by mouth every 8 (eight) hours as needed for nausea.   oxyCODONE -acetaminophen  7.5-325 MG tablet Commonly known as: Percocet Take 1 tablet by mouth every 4 (four) hours as needed for up to 28 doses for severe pain (pain score 7-10).   pantoprazole  40 MG tablet Commonly known as: PROTONIX  Take  40 mg by mouth daily.   potassium chloride  10 MEQ tablet Commonly known as: KLOR-CON  Take 10 mEq by mouth once.   predniSONE  20 MG tablet Commonly known as: DELTASONE  Take 20 mg by mouth daily.   QUEtiapine  25 MG tablet Commonly known as: SEROQUEL  Take 25 mg by mouth at bedtime.   simvastatin  10 MG tablet Commonly known as: ZOCOR  Take 10 mg by mouth at bedtime.   tiotropium 18 MCG inhalation capsule Commonly known as: SPIRIVA  Place 18 mcg into inhaler and inhale daily.   tirzepatide  5 MG/0.5ML Pen Commonly known as: ZEPBOUND  Inject 5 mg into the skin once a week.   tiZANidine  4 MG tablet Commonly known as: ZANAFLEX  Take 4 mg by mouth every 8 (eight) hours as needed for muscle spasms.   traZODone  100 MG tablet Commonly known as: DESYREL  Take 2 tablets (200 mg total) by mouth at bedtime. Needs office visit   venlafaxine  XR 75 MG 24 hr capsule Commonly known as: EFFEXOR -XR Take 225 mg by mouth daily with breakfast.   Vitamin D  (Ergocalciferol ) 1.25 MG (50000 UNIT) Caps capsule Commonly known as: DRISDOL  Take 1 capsule (50,000 Units total) by mouth every 7 (seven) days for 12 doses.               Durable Medical Equipment  (From admission, onward)           Start     Ordered   12/21/23 1544  For home use only DME wheelchair cushion (seat and back)  Once        12/21/23 1543   12/21/23 1544  For home use only DME Shower stool  Once        12/21/23 1543   12/21/23 1543  For home use only DME 3 n 1  Once        12/21/23 1543   12/21/23 1115  For home use only DME standard manual wheelchair with seat cushion  Once       Comments: Patient suffers from left ankle fracture which impairs their ability to perform daily activities like bathing, dressing, feeding, grooming, and toileting in the home.  A cane, crutch, or walker will not resolve issue with performing activities of daily living. A wheelchair will allow patient to safely perform daily activities. Patient  can safely propel the wheelchair in the home or has a caregiver  who can provide assistance. Length of need 12 months . Accessories: elevating leg rests (ELRs), wheel locks, extensions and anti-tippers.   12/21/23 1115            Contact information for after-discharge care     Destination     [facility] .   Service: Skilled Nursing Contact information: [address] [place]  [phone]             Home Medical Care     [facility] .   Service: Home Health Services Contact information: [phone] [place]  [phone]                    Allergies  Allergen Reactions   Codeine Itching    Can take with benadryl      Consultations: Podiatry    Procedures/Studies: DG Ankle 2 Views Left Result Date: 12/20/2023 CLINICAL DATA:  Left ankle open reduction and internal fixation EXAM: LEFT ANKLE - 2 VIEW COMPARISON:  Left ankle radiograph dated 12/18/2023 FINDINGS: Six fluoroscopic images obtained during open reduction internal fixation of the left ankle. 1 minute 10 seconds fluoro time utilized. Radiation dose 2.97 mGy Kerma. Please see performing physicians operative report for full details. IMPRESSION: Fluoroscopic images were obtained for intraoperative guidance of open reduction internal fixation of the left ankle. Electronically Signed   By: Limin  Xu M.D.   On: 12/20/2023 17:04   DG C-Arm 1-60 Min-No Report Result Date: 12/20/2023 Fluoroscopy was utilized by the requesting physician.  No radiographic interpretation.   DG C-Arm 1-60 Min-No Report Result Date: 12/20/2023 Fluoroscopy was utilized by the requesting physician.  No radiographic interpretation.   US  OR NERVE BLOCK-IMAGE ONLY [facility]) Result Date: 12/20/2023 There is no interpretation for this exam.  This order is for images obtained during a surgical procedure.  Please See Surgeries Tab  for more information regarding the procedure.   CT HEAD WO CONTRAST ( ) Result Date: 12/19/2023 EXAM: CT HEAD WITHOUT CONTRAST 12/19/2023 12:45:06 AM TECHNIQUE: CT of the head was performed without the administration of intravenous contrast. Automated exposure control, iterative reconstruction, and/or weight based adjustment of the mA/kV was utilized to reduce the radiation dose to as low as reasonably achievable. COMPARISON: 04/05/2023 CLINICAL HISTORY: Head trauma, moderate-severe. FINDINGS: BRAIN AND VENTRICLES: No acute hemorrhage. No evidence of acute infarct. No hydrocephalus. No extra-axial collection. No mass effect or midline shift. ORBITS: No acute abnormality. SINUSES: No acute abnormality. SOFT TISSUES AND SKULL: No acute soft tissue abnormality. No skull fracture. IMPRESSION: 1. No acute intracranial abnormality. Electronically signed by: Franky Crease MD 12/19/2023 12:50 AM EST RP Workstation: [id number]   CT ANKLE LEFT WO CONTRAST Result Date: 12/19/2023 EXAM: CT LEFT ANKLE, WITHOUT IV CONTRAST 12/19/2023 12:45:06 AM TECHNIQUE: Axial images were acquired through the left ankle without IV contrast. Reformatted images were reviewed. Automated exposure control, iterative reconstruction, and/or weight based adjustment of the mA/kV was utilized to reduce the radiation dose to as low as reasonably achievable. COMPARISON: Plain films 12/18/2023. CLINICAL HISTORY: Ankle trauma, fracture, xray done (Age >= 5y). FINDINGS: BONES: Oblique fractures noted through the distal fibula at the level of the ankle mortise and at the base of the medial malleolus. Coronal fracture through the posterior malleolus with slight displacement. JOINTS: No subluxation or dislocation. The joint spaces are normal. SOFT TISSUES: The soft tissues are unremarkable. IMPRESSION: 1. Trimalleolar left ankle fracture. Electronically signed by: Franky Crease MD 12/19/2023 12:49 AM  EST RP Workstation: [id number]   DG Ankle Complete  Left Result Date: 12/18/2023 EXAM: 3 OR MORE VIEW(S) XRAY OF THE LEFT ANKLE 12/18/2023 09:11:00 PM CLINICAL HISTORY: fall, ankle pain COMPARISON: None available. FINDINGS: BONES AND JOINTS: Oblique fractures of the distal fibula with fracture lines extending to the tibiofibular joint. Mild lateral displacement of distal fracture fragments. Fracture of the base of the medial malleolus extending to the articular surface. Mildly displaced coronal fracture of the posterior malleolus. Degenerative changes in the intertarsal joints. No joint dislocation. SOFT TISSUES: Soft tissue swelling. IMPRESSION: 1. Oblique distal fibular fracture with extension to the tibiofibular joint and mild lateral displacement of distal fragments. 2. Fracture of the base of the medial malleolus extending to the articular surface. 3. Mildly displaced coronal fracture of the posterior malleolus. 4. Soft tissue swelling. Electronically signed by: Elsie Gravely MD 12/18/2023 09:18 PM EST RP Workstation: [id number]   (Echo, Carotid, EGD, Colonoscopy, ERCP)    Subjective: Pt c/o intermittent ankle pain    Discharge Exam: Vitals:   12/23/23 0446 12/23/23 0800  BP: 104/80 116/82  Pulse: 73 78  Resp: 16 17  Temp: (!) 97.1 F (36.2 C) 98.1 F (36.7 C)  SpO2: 98% 99%   Vitals:   12/22/23 1632 12/22/23 2030 12/23/23 0446 12/23/23 0800  BP: 127/84 119/85 104/80 116/82  Pulse: 89 84 73 78  Resp: 16 16 16 17   Temp: 98.4 F (36.9 C) 98.1 F (36.7 C) (!) 97.1 F (36.2 C) 98.1 F (36.7 C)  TempSrc: Oral     SpO2: 97% 95% 98% 99%  Weight:      Height:        General: Pt is alert, awake, not in acute distress Cardiovascular:  S1/S2 +, no rubs, no gallops Respiratory: CTA bilaterally, no wheezing, no rhonchi Abdominal: Soft, NT, obese, bowel sounds + Extremities:  no cyanosis    The results of significant diagnostics from this hospitalization (including imaging, microbiology, ancillary and laboratory) are listed below  for reference.     Microbiology: No results found for this or any previous visit (from the past 240 hours).   Labs: BNP (last 3 results) No results for input(s): BNP in the last 8760 hours. Basic Metabolic Panel: Recent Labs  Lab 12/19/23 0453 12/20/23 0516 12/21/23 0625 12/22/23 0551 12/23/23 0354  NA 138 141 141 144 141  K 4.5 3.8 3.5 4.0 3.5  CL 103 105 106 102 102  CO2 24 21* 26 31 30   GLUCOSE 117* 122* 141* 124* 126*  BUN 5* 6 6 8 8   CREATININE 0.93 0.78 0.70 0.75 0.67  CALCIUM 8.8* 9.0 8.7* 9.4 8.7*  MG 2.1  --   --   --   --   PHOS 3.6  --   --   --   --    Liver Function Tests: Recent Labs  Lab 12/16/23 1432 12/18/23 2013  AST 16 16  ALT 13 12  ALKPHOS 87 78  BILITOT 0.8 0.6  PROT 6.4* 5.8*  ALBUMIN 4.3 4.0   No results for input(s): LIPASE, AMYLASE in the last 168 hours. No results for input(s): AMMONIA in the last 168 hours. CBC: Recent Labs  Lab 12/16/23 1432 12/18/23 2013 12/19/23 0453  WBC 6.3 9.1 6.9  HGB 13.9 12.9 12.8  HCT 40.7 38.2 37.0  MCV 87.2 88.2 86.7  PLT 179 187 193   Cardiac Enzymes: No results for input(s): CKTOTAL, CKMB, CKMBINDEX, TROPONINI in the last 168 hours. BNP: Invalid input(s): POCBNP  CBG: Recent Labs  Lab 12/18/23 2017  GLUCAP 156*   D-Dimer No results for input(s): DDIMER in the last 72 hours. Hgb A1c No results for input(s): HGBA1C in the last 72 hours. Lipid Profile No results for input(s): CHOL, HDL, LDLCALC, TRIG, CHOLHDL, LDLDIRECT in the last 72 hours. Thyroid  function studies No results for input(s): TSH, T4TOTAL, T3FREE, THYROIDAB in the last 72 hours.  Invalid input(s): FREET3 Anemia work up No results for input(s): VITAMINB12, FOLATE, FERRITIN, TIBC, IRON, RETICCTPCT in the last 72 hours. Urinalysis    Component Value Date/Time   COLORURINE YELLOW (A) 12/16/2023 1432   APPEARANCEUR HAZY (A) 12/16/2023 1432   APPEARANCEUR Cloudy (A)  08/07/2019 1436   LABSPEC 1.004 (L) 12/16/2023 1432   PHURINE 6.0 12/16/2023 1432   GLUCOSEU NEGATIVE 12/16/2023 1432   HGBUR NEGATIVE 12/16/2023 1432   BILIRUBINUR NEGATIVE 12/16/2023 1432   BILIRUBINUR Negative 08/07/2019 1436   KETONESUR NEGATIVE 12/16/2023 1432   PROTEINUR NEGATIVE 12/16/2023 1432   UROBILINOGEN 0.2 03/02/2009 1415   NITRITE NEGATIVE 12/16/2023 1432   LEUKOCYTESUR SMALL (A) 12/16/2023 1432   Sepsis Labs Recent Labs  Lab 12/16/23 1432 12/18/23 2013 12/19/23 0453  WBC 6.3 9.1 6.9   Microbiology No results found for this or any previous visit (from the past 240 hours).   Time coordinating discharge: 37 minutes  SIGNED:   Anthony CHRISTELLA Pouch, MD  [facility] 12/23/2023, 10:05 AM Pager   If 7PM-7AM, please contact night-coverage [email or web address]

## 2023-12-23 NOTE — TOC CM/SW Note (Signed)
 Transition of Care (TOC) CM/SW Note   To Whom It May Concern:   Please be advised that the above-named patient will require a short-term nursing home stay - anticipated 30 days or less for rehabilitation and strengthening.  The plan is for return home

## 2023-12-24 DIAGNOSIS — S82852A Displaced trimalleolar fracture of left lower leg, initial encounter for closed fracture: Secondary | ICD-10-CM

## 2023-12-24 LAB — BASIC METABOLIC PANEL WITH GFR
Anion gap: 10 (ref 5–15)
BUN: 11 mg/dL (ref 6–20)
CO2: 31 mmol/L (ref 22–32)
Calcium: 8.9 mg/dL (ref 8.9–10.3)
Chloride: 99 mmol/L (ref 98–111)
Creatinine, Ser: 0.7 mg/dL (ref 0.44–1.00)
GFR, Estimated: >60 mL/min (ref >60)
Glucose, Bld: 87 mg/dL (ref 70–99)
Potassium: 3.6 mmol/L (ref 3.5–5.1)
Sodium: 140 mmol/L (ref 135–145)

## 2023-12-24 NOTE — Plan of Care (Signed)
   Problem: Education: Goal: Knowledge of General Education information will improve Description Including pain rating scale, medication(s)/side effects and non-pharmacologic comfort measures Outcome: Progressing

## 2023-12-24 NOTE — Plan of Care (Signed)

## 2023-12-24 NOTE — TOC Transition Note (Signed)
 Transition of Care [facility]) - Discharge Note   Patient Details  Name: Katie Neal MRN: [id number] Date of Birth: [birth year]  [facility] CM/SW Contact:  Alvaro Louder, LCSW Phone Number: 12/24/2023, 9:26 AM   Clinical Narrative:   Pending pasarr number has been received. [phone] E Start 12/24/23- End 01/23/23.   LCSWA received insurance approval for patient to admit to SNF [facility]. LCSWA confirmed with MD that patient is stable for discharge. LCSWA notified the patient and they are in agreement with discharge. LCSWA confirmed bed is available at SNF. Transport arranged with [facility] for next available.  RM 506 number to call report: [phone]   TOC signing off  Final next level of care: Skilled Nursing Facility Barriers to Discharge: No Barriers Identified   Patient Goals and CMS Choice            Discharge Placement              Patient chooses bed at: [facility] Patient to be transferred to facility by: [facility] Name of family member notified: Self Patient and family notified of of transfer: 12/24/23  Discharge Plan and Services Additional resources added to the After Visit Summary for                                       Social Drivers of Health (SDOH) Interventions SDOH Screenings   Food Insecurity: No Food Insecurity (12/19/2023)  Housing: Unknown (12/19/2023)  Transportation Needs: No Transportation Needs (12/19/2023)  Tobacco Use: Medium Risk (12/16/2023)   Received from [facility]     Readmission Risk Interventions    12/21/2023    4:00 PM  Readmission Risk Prevention Plan  Transportation Screening Complete  Medication Review (RN Care Manager) Complete  PCP or Specialist appointment within 3-5 days of discharge Complete  HRI or Home Care Consult Complete  SW Recovery Care/Counseling Consult Complete  Palliative Care Screening Not Applicable  Skilled Nursing Facility Not  Applicable

## 2024-01-01 ENCOUNTER — Ambulatory Visit: Admitting: Podiatry

## 2024-01-02 ENCOUNTER — Ambulatory Visit

## 2024-01-02 DIAGNOSIS — S82892A Other fracture of left lower leg, initial encounter for closed fracture: Secondary | ICD-10-CM

## 2024-01-02 DIAGNOSIS — Z9889 Other specified postprocedural states: Secondary | ICD-10-CM

## 2024-01-02 DIAGNOSIS — Z8781 Personal history of (healed) traumatic fracture: Secondary | ICD-10-CM

## 2024-01-02 NOTE — Progress Notes (Signed)
 Subjective:  Patient ID: Katie Neal, female    DOB: 09-Feb-1970,  MRN: [id number]  Chief Complaint  Patient presents with   Routine Post Op     hospital follow up Diane from [facility] called on 12/30/23 to verify pt's appointment, not on dpr, was asked to have pt call to conrifm appointment--Called LM on VM per Dr Verta pt to see Dr Magdalen. scheduled her on Thursday at 2:30pm     DOS: 12/20/23 Procedure: left ankle trimalleolar ORIF  53 y.o. female returns for post-op check.  She is almost 2 weeks status post left ankle trimalleolar ORIF without fixation of the posterior malleolus.  She has been at the rehab facility and states that they have recently gotten her pain well under control.  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  She has been compliant with nonweightbearing in cam walker.  Review of Systems: Negative except as noted in the HPI. Denies N/V/F/Ch.  Past Medical History:  Diagnosis Date   Arthritis    Asthma    Cardiomegaly    Cerebral atrophy    Complex regional pain syndrome type 1    COPD (chronic obstructive pulmonary disease) (HCC)    CRPS (complex regional pain syndrome type I)    Depression    Diaphragmatic hernia    Dyspnea    Excessive falling    Headache    History of chronic sinusitis    History of kidney stones    Hyperlipidemia    Hypothyroidism    IBS (irritable bowel syndrome)    Insomnia    Memory difficulty    Osteoarthritis of knee    Pneumonia    Post concussion syndrome    PTSD (post-traumatic stress disorder)    Seizure-like activity (HCC)    Sleep apnea    not on Cpap machine but is on oxygen    Urinary incontinence    Vasovagal syncope    Current Medications[1]  Tobacco Use History[2]  Allergies[3] Objective:  There were no vitals filed for this visit. There is no height or weight on file to calculate BMI. Constitutional Well developed. Well nourished.  Vascular Foot warm and well perfused. Capillary refill  normal to all digits.  Calf supple, nontender, no erythema, negative Homan.  Neurologic Normal speech. Oriented to person, place, and time. Epicritic sensation to light touch grossly present bilaterally.  Dermatologic Skin healing well without signs of infection.  Sutures intact.  Skin edges well coapted without signs of infection. Mild edema around surgical site.   Orthopedic: Mild tenderness to palpation noted about the surgical site within normal limits.  Medial malleoli are screws unable to be palpated.   Radiographs: 3 views of the left ankle were taken that show stable hardware after medial malleolus and lateral malleolus ORIF.  Restoration of mortise.  There are 2 screws in the medial malleolus.  Screw and plate and screw fixation in the lateral malleolus.  Syndesmosis is reduced.  No complications identified. Assessment:   1. Closed fracture of left ankle, initial encounter   2. Status post ORIF of fracture of ankle    Plan:  Patient was evaluated and treated and all questions answered. Doing well in post operative recovery.   S/p Left ankle surgery  -Progressing as expected post-operatively.  Doing well in her recovery. -XR: As above, expected postoperative changes. No callus formation yet.  -WB Status: Non weight bearing in CAM walker. -Sutures: intact, will be removed at next post-op visit.  Return to clinic in 1 week for suture removal -Medications: Not needed, continue to work on pain control at her rehab facility.  She states that this has recently improved significantly. -Foot redressed and she was placed back into CAM walker.  -XR at next visit: not needed  Return to clinic 1 week for suture removal  Prentice Ovens, D.P.M.     [1]  Current Outpatient Medications:    albuterol  (PROVENTIL ) (2.5 MG/3ML) 0.083% nebulizer solution, Take 2.5 mg by nebulization every 6 (six) hours as needed for wheezing or shortness of breath. , Disp: , Rfl:    amitriptyline  (ELAVIL ) 25 MG  tablet, Take 25 mg by mouth at bedtime. , Disp: , Rfl:    aspirin  EC 81 MG tablet, Take 1 tablet (81 mg total) by mouth 2 (two) times daily. Swallow whole., Disp: 60 tablet, Rfl: 0   budesonide (PULMICORT) 0.25 MG/2ML nebulizer solution, Take 0.25 mg by nebulization 2 (two) times daily., Disp: , Rfl:    budesonide-formoterol  (SYMBICORT) 80-4.5 MCG/ACT inhaler, Inhale 2 puffs into the lungs 2 (two) times daily., Disp: , Rfl:    dicyclomine  (BENTYL ) 20 MG tablet, Take 20 mg by mouth 3 (three) times daily before meals., Disp: , Rfl:    doxepin  (SINEQUAN ) 10 MG capsule, Take 10 mg by mouth at bedtime as needed., Disp: , Rfl:    EMGALITY  120 MG/ML SOAJ, Inject 120 mg into the skin every 30 (thirty) days., Disp: , Rfl:    fluticasone  (FLONASE ) 50 MCG/ACT nasal spray, Place 2 sprays into both nostrils daily as needed. , Disp: , Rfl:    gabapentin  (NEURONTIN ) 100 MG capsule, Take 100 mg by mouth 3 (three) times daily., Disp: , Rfl:    hydrOXYzine  (ATARAX ) 25 MG tablet, Take 25 mg by mouth 3 (three) times daily as needed., Disp: , Rfl:    lamoTRIgine  (LAMICTAL ) 100 MG tablet, Take 100-200 mg by mouth See admin instructions. Take 1 tablet (100mg ) by mouth every morning and take 2 tablets (200mg ) by mouth at bedtime, Disp: , Rfl:    levocetirizine (XYZAL ) 5 MG tablet, SMARTSIG:1 Tablet(s) By Mouth Every Evening, Disp: , Rfl:    levothyroxine  (SYNTHROID , LEVOTHROID) 137 MCG tablet, Take 1 tablet by mouth 6 days/week. Take half tablet (68.5 mcg) on Sunday. Take on an empty stomach with a glass of water at least 30-60 minutes before breakfast., Disp: , Rfl:    linaclotide  (LINZESS ) 290 MCG CAPS capsule, Take 1 capsule (290 mcg total) by mouth at bedtime for 1 day., Disp: 1 capsule, Rfl: 0   [START ON 01/18/2024] Mepolizumab  (NUCALA ) 100 MG/ML SOAJ, Inject 1 mL (100 mg total) into the skin every 28 (twenty-eight) days., Disp: , Rfl:    metoprolol  succinate (TOPROL -XL) 25 MG 24 hr tablet, Take 25 mg by mouth daily.,  Disp: , Rfl:    montelukast  (SINGULAIR ) 10 MG tablet, Take 10 mg by mouth daily., Disp: , Rfl:    Multiple Vitamin (MULTIVITAMIN WITH MINERALS) TABS tablet, Take 1 tablet by mouth daily., Disp: , Rfl:    Olopatadine  HCl 0.7 % SOLN, Place 1 drop into both eyes 2 (two) times daily as needed (allergy symptoms). , Disp: , Rfl:    ondansetron  (ZOFRAN -ODT) 4 MG disintegrating tablet, Take 4 mg by mouth every 8 (eight) hours as needed for nausea., Disp: , Rfl:    oxyCODONE -acetaminophen  (PERCOCET) 7.5-325 MG tablet, Take 1 tablet by mouth every 4 (four) hours as needed for up to 28 doses for severe pain (pain score  7-10)., Disp: 28 tablet, Rfl: 0   pantoprazole  (PROTONIX ) 40 MG tablet, Take 40 mg by mouth daily., Disp: , Rfl:    potassium chloride  (KLOR-CON ) 10 MEQ tablet, Take 10 mEq by mouth once., Disp: , Rfl:    predniSONE  (DELTASONE ) 20 MG tablet, Take 20 mg by mouth daily., Disp: , Rfl:    QUEtiapine  (SEROQUEL ) 25 MG tablet, Take 25 mg by mouth at bedtime., Disp: , Rfl:    simvastatin  (ZOCOR ) 10 MG tablet, Take 10 mg by mouth at bedtime. , Disp: , Rfl:    tiotropium (SPIRIVA ) 18 MCG inhalation capsule, Place 18 mcg into inhaler and inhale daily., Disp: , Rfl:    tirzepatide  (ZEPBOUND ) 5 MG/0.5ML Pen, Inject 5 mg into the skin once a week., Disp: , Rfl:    tiZANidine  (ZANAFLEX ) 4 MG tablet, Take 4 mg by mouth every 8 (eight) hours as needed for muscle spasms., Disp: , Rfl:    traZODone  (DESYREL ) 100 MG tablet, Take 2 tablets (200 mg total) by mouth at bedtime. Needs office visit, Disp: 60 tablet, Rfl: 0   venlafaxine  XR (EFFEXOR -XR) 75 MG 24 hr capsule, Take 225 mg by mouth daily with breakfast., Disp: , Rfl:    VENTOLIN  HFA 108 (90 Base) MCG/ACT inhaler, Inhale 2 puffs into the lungs every 6 (six) hours as needed for wheezing., Disp: , Rfl:    vitamin C (ASCORBIC ACID ) 500 MG tablet, Take 1,000 mg by mouth daily., Disp: , Rfl:    Vitamin D , Ergocalciferol , (DRISDOL ) 1.25 MG (50000 UNIT) CAPS  capsule, Take 1 capsule (50,000 Units total) by mouth every 7 (seven) days for 12 doses., Disp: 12 capsule, Rfl: 0 [2]  Social History Tobacco Use  Smoking Status Former   Current packs/day: 0.00   Types: Cigarettes   Quit date: 10/22/2013   Years since quitting: 10.2  Smokeless Tobacco Never  [3]  Allergies Allergen Reactions   Codeine Itching    Can take with benadryl 

## 2024-01-09 ENCOUNTER — Ambulatory Visit

## 2024-01-09 DIAGNOSIS — Z8781 Personal history of (healed) traumatic fracture: Secondary | ICD-10-CM

## 2024-01-09 DIAGNOSIS — Z9889 Other specified postprocedural states: Secondary | ICD-10-CM

## 2024-01-09 DIAGNOSIS — S82892A Other fracture of left lower leg, initial encounter for closed fracture: Secondary | ICD-10-CM

## 2024-01-09 NOTE — Progress Notes (Signed)
 Subjective:  Patient ID: Katie Neal, female    DOB: 05/07/1970,  MRN: [id number]  Chief Complaint  Patient presents with   Routine Post Op     Follow up left foot- painful and swollen     DOS: 12/20/23 Procedure: left ankle trimalleolar ORIF  53 y.o. female returns for post-op check.  She is 3 weeks status post left ankle trimalleolar ORIF without fixation of the posterior malleolus.  She has been at the rehab facility and states that she is doing well. She will be leaving the facility on Sunday.  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  She has been compliant with nonweightbearing in cam walker.  Review of Systems: Negative except as noted in the HPI. Denies N/V/F/Ch.  Past Medical History:  Diagnosis Date   Arthritis    Asthma    Cardiomegaly    Cerebral atrophy    Complex regional pain syndrome type 1    COPD (chronic obstructive pulmonary disease) (HCC)    CRPS (complex regional pain syndrome type I)    Depression    Diaphragmatic hernia    Dyspnea    Excessive falling    Headache    History of chronic sinusitis    History of kidney stones    Hyperlipidemia    Hypothyroidism    IBS (irritable bowel syndrome)    Insomnia    Memory difficulty    Osteoarthritis of knee    Pneumonia    Post concussion syndrome    PTSD (post-traumatic stress disorder)    Seizure-like activity (HCC)    Sleep apnea    not on Cpap machine but is on oxygen    Urinary incontinence    Vasovagal syncope    Current Medications[1]  Tobacco Use History[2]  Allergies[3] Objective:  There were no vitals filed for this visit. There is no height or weight on file to calculate BMI. Constitutional Well developed. Well nourished.  Vascular Foot warm and well perfused. Capillary refill normal to all digits.  Calf supple, nontender, no erythema, negative Homan.  Neurologic Normal speech. Oriented to person, place, and time. Epicritic sensation to light touch grossly present  bilaterally. No hypersensitivity to incisional sites.   Dermatologic Skin well healed without signs of infection.  Sutures intact.  Skin edges well coapted without signs of infection. Mild edema around surgical site. Ecchymosis present, consistent with post-operative state.   Orthopedic: Mild tenderness to palpation noted about the surgical site within normal limits.  Medial malleoli are screws unable to be palpated.   Assessment:   1. Closed fracture of left ankle, initial encounter   2. Status post ORIF of fracture of ankle     Plan:  Patient was evaluated and treated and all questions answered. Doing well in post operative recovery.   S/p Left ankle surgery  -Progressing as expected post-operatively.  Doing well in her recovery. -XR: Not taken today -WB Status: Transition from NWB to WBAT in CAM walker. Patient and I discussed gradual transition to her tolerance beginning with ~10-20% body weight. We discussed the importance of her doing this when she feels stable, she is to avoid this if she feels at risk for falling -She can begin showering tomorrow. Avoid soaking foot in bath or hot tub.  -Sutures: Removed today without incident. No dehiscence -Medications: Not needed, continue to work on pain control at her rehab facility.   -Foot redressed and she was placed back into CAM walker.  -XR  at next visit: left ankle  Return to clinic 4 weeks with pre-clinical XR  Prentice Ovens, D.P.M.     [1]  Current Outpatient Medications:    albuterol  (PROVENTIL ) (2.5 MG/3ML) 0.083% nebulizer solution, Take 2.5 mg by nebulization every 6 (six) hours as needed for wheezing or shortness of breath. , Disp: , Rfl:    amitriptyline  (ELAVIL ) 25 MG tablet, Take 25 mg by mouth at bedtime. , Disp: , Rfl:    aspirin  EC 81 MG tablet, Take 1 tablet (81 mg total) by mouth 2 (two) times daily. Swallow whole., Disp: 60 tablet, Rfl: 0   budesonide (PULMICORT) 0.25 MG/2ML nebulizer solution, Take 0.25 mg by  nebulization 2 (two) times daily., Disp: , Rfl:    budesonide-formoterol  (SYMBICORT) 80-4.5 MCG/ACT inhaler, Inhale 2 puffs into the lungs 2 (two) times daily., Disp: , Rfl:    dicyclomine  (BENTYL ) 20 MG tablet, Take 20 mg by mouth 3 (three) times daily before meals., Disp: , Rfl:    doxepin  (SINEQUAN ) 10 MG capsule, Take 10 mg by mouth at bedtime as needed., Disp: , Rfl:    EMGALITY  120 MG/ML SOAJ, Inject 120 mg into the skin every 30 (thirty) days., Disp: , Rfl:    fluticasone  (FLONASE ) 50 MCG/ACT nasal spray, Place 2 sprays into both nostrils daily as needed. , Disp: , Rfl:    gabapentin  (NEURONTIN ) 100 MG capsule, Take 100 mg by mouth 3 (three) times daily., Disp: , Rfl:    hydrOXYzine  (ATARAX ) 25 MG tablet, Take 25 mg by mouth 3 (three) times daily as needed., Disp: , Rfl:    lamoTRIgine  (LAMICTAL ) 100 MG tablet, Take 100-200 mg by mouth See admin instructions. Take 1 tablet (100mg ) by mouth every morning and take 2 tablets (200mg ) by mouth at bedtime, Disp: , Rfl:    levocetirizine (XYZAL ) 5 MG tablet, SMARTSIG:1 Tablet(s) By Mouth Every Evening, Disp: , Rfl:    levothyroxine  (SYNTHROID , LEVOTHROID) 137 MCG tablet, Take 1 tablet by mouth 6 days/week. Take half tablet (68.5 mcg) on Sunday. Take on an empty stomach with a glass of water at least 30-60 minutes before breakfast., Disp: , Rfl:    linaclotide  (LINZESS ) 290 MCG CAPS capsule, Take 1 capsule (290 mcg total) by mouth at bedtime for 1 day., Disp: 1 capsule, Rfl: 0   [START ON 01/18/2024] Mepolizumab  (NUCALA ) 100 MG/ML SOAJ, Inject 1 mL (100 mg total) into the skin every 28 (twenty-eight) days., Disp: , Rfl:    metoprolol  succinate (TOPROL -XL) 25 MG 24 hr tablet, Take 25 mg by mouth daily., Disp: , Rfl:    montelukast  (SINGULAIR ) 10 MG tablet, Take 10 mg by mouth daily., Disp: , Rfl:    Multiple Vitamin (MULTIVITAMIN WITH MINERALS) TABS tablet, Take 1 tablet by mouth daily., Disp: , Rfl:    Olopatadine  HCl 0.7 % SOLN, Place 1 drop into  both eyes 2 (two) times daily as needed (allergy symptoms). , Disp: , Rfl:    ondansetron  (ZOFRAN -ODT) 4 MG disintegrating tablet, Take 4 mg by mouth every 8 (eight) hours as needed for nausea., Disp: , Rfl:    oxyCODONE -acetaminophen  (PERCOCET) 7.5-325 MG tablet, Take 1 tablet by mouth every 4 (four) hours as needed for up to 28 doses for severe pain (pain score 7-10)., Disp: 28 tablet, Rfl: 0   pantoprazole  (PROTONIX ) 40 MG tablet, Take 40 mg by mouth daily., Disp: , Rfl:    potassium chloride  (KLOR-CON ) 10 MEQ tablet, Take 10 mEq by mouth once., Disp: , Rfl:  predniSONE  (DELTASONE ) 20 MG tablet, Take 20 mg by mouth daily., Disp: , Rfl:    QUEtiapine  (SEROQUEL ) 25 MG tablet, Take 25 mg by mouth at bedtime., Disp: , Rfl:    simvastatin  (ZOCOR ) 10 MG tablet, Take 10 mg by mouth at bedtime. , Disp: , Rfl:    tiotropium (SPIRIVA ) 18 MCG inhalation capsule, Place 18 mcg into inhaler and inhale daily., Disp: , Rfl:    tirzepatide  (ZEPBOUND ) 5 MG/0.5ML Pen, Inject 5 mg into the skin once a week., Disp: , Rfl:    tiZANidine  (ZANAFLEX ) 4 MG tablet, Take 4 mg by mouth every 8 (eight) hours as needed for muscle spasms., Disp: , Rfl:    traZODone  (DESYREL ) 100 MG tablet, Take 2 tablets (200 mg total) by mouth at bedtime. Needs office visit, Disp: 60 tablet, Rfl: 0   venlafaxine  XR (EFFEXOR -XR) 75 MG 24 hr capsule, Take 225 mg by mouth daily with breakfast., Disp: , Rfl:    VENTOLIN  HFA 108 (90 Base) MCG/ACT inhaler, Inhale 2 puffs into the lungs every 6 (six) hours as needed for wheezing., Disp: , Rfl:    vitamin C (ASCORBIC ACID ) 500 MG tablet, Take 1,000 mg by mouth daily., Disp: , Rfl:    Vitamin D , Ergocalciferol , (DRISDOL ) 1.25 MG (50000 UNIT) CAPS capsule, Take 1 capsule (50,000 Units total) by mouth every 7 (seven) days for 12 doses., Disp: 12 capsule, Rfl: 0 [2]  Social History Tobacco Use  Smoking Status Former   Current packs/day: 0.00   Types: Cigarettes   Quit date: 10/22/2013   Years since  quitting: 10.2  Smokeless Tobacco Never  [3]  Allergies Allergen Reactions   Codeine Itching    Can take with benadryl 

## 2024-01-14 ENCOUNTER — Emergency Department: Admission: EM | Admit: 2024-01-14 | Discharge: 2024-01-14 | Disposition: A | Discharge status: Home / Self Care

## 2024-01-14 ENCOUNTER — Other Ambulatory Visit: Payer: Self-pay

## 2024-01-14 ENCOUNTER — Emergency Department

## 2024-01-14 ENCOUNTER — Telehealth: Payer: Self-pay | Admitting: Lab

## 2024-01-14 ENCOUNTER — Encounter: Payer: Self-pay | Admitting: Emergency Medicine

## 2024-01-14 DIAGNOSIS — Z79899 Other long term (current) drug therapy: Secondary | ICD-10-CM | POA: Diagnosis not present

## 2024-01-14 DIAGNOSIS — G8929 Other chronic pain: Secondary | ICD-10-CM

## 2024-01-14 DIAGNOSIS — M25572 Pain in left ankle and joints of left foot: Secondary | ICD-10-CM | POA: Insufficient documentation

## 2024-01-14 DIAGNOSIS — M79672 Pain in left foot: Secondary | ICD-10-CM | POA: Diagnosis not present

## 2024-01-14 DIAGNOSIS — E039 Hypothyroidism, unspecified: Secondary | ICD-10-CM | POA: Diagnosis not present

## 2024-01-14 DIAGNOSIS — Z7951 Long term (current) use of inhaled steroids: Secondary | ICD-10-CM | POA: Insufficient documentation

## 2024-01-14 DIAGNOSIS — J45909 Unspecified asthma, uncomplicated: Secondary | ICD-10-CM | POA: Insufficient documentation

## 2024-01-14 DIAGNOSIS — J111 Influenza due to unidentified influenza virus with other respiratory manifestations: Secondary | ICD-10-CM

## 2024-01-14 DIAGNOSIS — R000 Tachycardia, unspecified: Secondary | ICD-10-CM | POA: Diagnosis not present

## 2024-01-14 DIAGNOSIS — R509 Fever, unspecified: Secondary | ICD-10-CM | POA: Insufficient documentation

## 2024-01-14 DIAGNOSIS — G9050 Complex regional pain syndrome I, unspecified: Secondary | ICD-10-CM | POA: Insufficient documentation

## 2024-01-14 LAB — RESP PANEL BY RT-PCR (RSV, FLU A&B, COVID)  RVPGX2
Influenza A by PCR: POSITIVE — AB
Influenza B by PCR: NEGATIVE
Resp Syncytial Virus by PCR: NEGATIVE
SARS Coronavirus 2 by RT PCR: NEGATIVE

## 2024-01-14 LAB — URINALYSIS, COMPLETE (UACMP) WITH MICROSCOPIC
Bacteria, UA: NONE SEEN
Bilirubin Urine: NEGATIVE
Glucose, UA: NEGATIVE mg/dL
Hgb urine dipstick: NEGATIVE
Ketones, ur: NEGATIVE mg/dL
Nitrite: NEGATIVE
Protein, ur: NEGATIVE mg/dL
Specific Gravity, Urine: 1.013 (ref 1.005–1.030)
pH: 5 (ref 5.0–8.0)

## 2024-01-14 LAB — CBC WITH DIFFERENTIAL/PLATELET
Abs Immature Granulocytes: 0.11 K/uL — ABNORMAL HIGH (ref 0.00–0.07)
Basophils Absolute: 0 K/uL (ref 0.0–0.1)
Basophils Relative: 0 %
Eosinophils Absolute: 0 K/uL (ref 0.0–0.5)
Eosinophils Relative: 1 %
HCT: 38.1 % (ref 36.0–46.0)
Hemoglobin: 12 g/dL (ref 12.0–15.0)
Immature Granulocytes: 2 %
Lymphocytes Relative: 18 %
Lymphs Abs: 1.3 K/uL (ref 0.7–4.0)
MCH: 29.5 pg (ref 26.0–34.0)
MCHC: 31.5 g/dL (ref 30.0–36.0)
MCV: 93.6 fL (ref 80.0–100.0)
Monocytes Absolute: 1 K/uL (ref 0.1–1.0)
Monocytes Relative: 13 %
Neutro Abs: 4.8 K/uL (ref 1.7–7.7)
Neutrophils Relative %: 66 %
Platelets: 152 K/uL (ref 150–400)
RBC: 4.07 MIL/uL (ref 3.87–5.11)
RDW: 14 % (ref 11.5–15.5)
WBC: 7.3 K/uL (ref 4.0–10.5)
nRBC: 0 % (ref 0.0–0.2)

## 2024-01-14 LAB — BASIC METABOLIC PANEL WITH GFR
Anion gap: 13 (ref 5–15)
BUN: 12 mg/dL (ref 6–20)
CO2: 26 mmol/L (ref 22–32)
Calcium: 8.6 mg/dL — ABNORMAL LOW (ref 8.9–10.3)
Chloride: 95 mmol/L — ABNORMAL LOW (ref 98–111)
Creatinine, Ser: 1.22 mg/dL — ABNORMAL HIGH (ref 0.44–1.00)
GFR, Estimated: 53 mL/min — ABNORMAL LOW
Glucose, Bld: 100 mg/dL — ABNORMAL HIGH (ref 70–99)
Potassium: 4.2 mmol/L (ref 3.5–5.1)
Sodium: 134 mmol/L — ABNORMAL LOW (ref 135–145)

## 2024-01-14 LAB — LACTIC ACID, PLASMA
Lactic Acid, Venous: 2.9 mmol/L (ref 0.5–1.9)
Lactic Acid, Venous: 1.4 mmol/L (ref 0.5–1.9)

## 2024-01-14 LAB — PROCALCITONIN: Procalcitonin: 0.26 ng/mL

## 2024-01-14 MED ORDER — ONDANSETRON HCL 4 MG/2ML IJ SOLN
4.0000 mg | Freq: Once | INTRAMUSCULAR | Status: AC
  Administered 2024-01-14: 4 mg via INTRAVENOUS
  Filled 2024-01-14: qty 2

## 2024-01-14 MED ORDER — KETOROLAC TROMETHAMINE 15 MG/ML IJ SOLN
15.0000 mg | Freq: Once | INTRAMUSCULAR | Status: AC
  Administered 2024-01-14: 15 mg via INTRAVENOUS
  Filled 2024-01-14: qty 1

## 2024-01-14 MED ORDER — HYDROMORPHONE HCL 1 MG/ML IJ SOLN
1.0000 mg | Freq: Once | INTRAMUSCULAR | Status: AC
  Administered 2024-01-14: 1 mg via INTRAVENOUS
  Filled 2024-01-14: qty 1

## 2024-01-14 MED ORDER — SODIUM CHLORIDE 0.9 % IV BOLUS
1000.0000 mL | Freq: Once | INTRAVENOUS | Status: AC
  Administered 2024-01-14: 1000 mL via INTRAVENOUS

## 2024-01-14 MED ORDER — OXYCODONE HCL 5 MG PO TABS
7.5000 mg | ORAL_TABLET | Freq: Once | ORAL | Status: AC
  Administered 2024-01-14: 7.5 mg via ORAL
  Filled 2024-01-14: qty 2

## 2024-01-14 MED ORDER — FENTANYL CITRATE (PF) 50 MCG/ML IJ SOSY
50.0000 ug | PREFILLED_SYRINGE | Freq: Once | INTRAMUSCULAR | Status: AC
  Administered 2024-01-14: 50 ug via INTRAVENOUS
  Filled 2024-01-14: qty 1

## 2024-01-14 MED ORDER — IPRATROPIUM-ALBUTEROL 0.5-2.5 (3) MG/3ML IN SOLN
3.0000 mL | Freq: Once | RESPIRATORY_TRACT | Status: AC
  Administered 2024-01-14: 3 mL via RESPIRATORY_TRACT
  Filled 2024-01-14: qty 3

## 2024-01-14 MED ORDER — OXYCODONE-ACETAMINOPHEN 5-325 MG PO TABS: 1.0000 | ORAL_TABLET | ORAL | 0 refills | Status: AC | PRN

## 2024-01-14 MED ORDER — METOPROLOL SUCCINATE ER 25 MG PO TB24
25.0000 mg | ORAL_TABLET | Freq: Every day | ORAL | Status: DC
  Administered 2024-01-14: 25 mg via ORAL
  Filled 2024-01-14: qty 1

## 2024-01-14 NOTE — ED Triage Notes (Signed)
 Pt arrived via [facility] from home with unmanageable pain in her left ankle. Pt had surgery on 12/20/23 for left ankle fracture. Pt reports since she was discharged from Rehab facility on 01/13/24 she has not been able to manage the pain.

## 2024-01-14 NOTE — Discharge Instructions (Addendum)
 Your workup was reassuring did show some slight dehydration which I suspect between and having the flu that could have flared up your pain.  Your orthopedic doctor has prescribed you additional pain medications but you will need to get additional pain medication through the pain clinic.  Return to the ER if you develop worsening symptoms shortness of breath or any other concerns otherwise you have been cleared for discharge home.

## 2024-01-14 NOTE — ED Provider Notes (Addendum)
 "  Katie Neal Provider Note    Event Date/Time   First MD Initiated Contact with Patient 01/14/24 [phone]     (approximate)   History   Ankle Pain   HPI  Katie Neal is a 53 y.o. female with medical history significant of migraine headaches, CRPS (complex regional pain syndrome type I, involving internal organs including bladder), hyperlipidemia, asthma, GERD, hypothyroidism, depression with anxiety, PTSD, IBS, , insomnia, seizure who presents from home with intractable left ankle pain.  Patient was recently in a skilled nursing facility for several weeks after ORIF of left ankle which was done by podiatry (Dr. Magdalen).  She recovered well at the skilled nursing facility and was discharged to home on 01/13/2024.  Patient states that she is on pain meds at baseline but she was not prescribed any additional pain meds for her left ankle and has had intractable pain since.  Denies any new trauma to the area.     Physical Exam   Triage Vital Signs: ED Triage Vitals  Encounter Vitals Group     BP --      Girls Systolic BP Percentile --      Girls Diastolic BP Percentile --      Boys Systolic BP Percentile --      Boys Diastolic BP Percentile --      Pulse --      Resp --      Temp 01/14/24 0611 (!) 100.6 F (38.1 C)     Temp Source 01/14/24 0611 Oral     SpO2 01/14/24 0607 97 %     Weight 01/14/24 0608 185 lb 3 oz (84 kg)     Height 01/14/24 0608 5' 5 (1.651 m)     Head Circumference --      Peak Flow --      Pain Score 01/14/24 0608 10     Pain Loc --      Pain Education --      Exclude from Growth Chart --     Most recent vital signs: Vitals:   01/14/24 0611 01/14/24 0615  BP:  (!) 103/90  Pulse:  (!) 114  Resp:  17  Temp: (!) 100.6 F (38.1 C)   SpO2:  92%    Nursing Triage Note reviewed. Vital signs reviewed and patients oxygen saturation is normoxic  General: Patient is well nourished, well developed, awake and alert, appears  uncomfortable Head: Normocephalic and atraumatic Eyes: Normal inspection, extraocular muscles intact, no conjunctival pallor Ear, nose, throat: Normal external exam Neck: Normal range of motion Respiratory: Patient is in no respiratory distress, lungs CTAB Cardiovascular: Patient is not tachycardic, RRR without murmur appreciated GI: Abd SNT with no guarding or rebound  Back: Normal inspection of the back with good strength and range of motion throughout all ext Extremities: pulses intact with good cap refills, no LE pitting edema or calf tenderness   Neuro: The patient is alert and oriented to person, place, and time, appropriately conversive, with 5/5 bilat UE/LE strength, no gross motor or sensory defects noted. Coordination appears to be adequate. Skin: Warm, dry, and intact Psych: normal mood and affect, no SI or HI  ED Results / Procedures / Treatments   Labs (all labs ordered are listed, but only abnormal results are displayed) Labs Reviewed  RESP PANEL BY RT-PCR (RSV, FLU A&B, COVID)  RVPGX2 - Abnormal; Notable for the following components:      Result Value   Influenza A by  PCR POSITIVE (*)    All other components within normal limits  CBC WITH DIFFERENTIAL/PLATELET - Abnormal; Notable for the following components:   Abs Immature Granulocytes 0.11 (*)    All other components within normal limits  BASIC METABOLIC PANEL WITH GFR - Abnormal; Notable for the following components:   Sodium 134 (*)    Chloride 95 (*)    Glucose, Bld 100 (*)    Creatinine, Ser 1.22 (*)    Calcium 8.6 (*)    GFR, Estimated 53 (*)    All other components within normal limits  LACTIC ACID, PLASMA - Abnormal; Notable for the following components:   Lactic Acid, Venous 2.9 (*)    All other components within normal limits  PROCALCITONIN  URINALYSIS, COMPLETE (UACMP) WITH MICROSCOPIC     EKG EKG and rhythm strip are interpreted by myself:   EKG: [Normal sinus rhythm] at heart rate of 114,  normal QRS duration, QTc 447, nonspecific ST segments and T waves no ectopy EKG not consistent with Acute STEMI Rhythm strip: Sinus tachycardia in lead II   RADIOLOGY Pending    PROCEDURES:  Critical Care performed: No  Procedures   MEDICATIONS ORDERED IN ED: Medications  ondansetron  (ZOFRAN ) injection 4 mg (4 mg Intravenous Given 01/14/24 0639)  HYDROmorphone  (DILAUDID ) injection 1 mg (1 mg Intravenous Given 01/14/24 0640)  ketorolac  (TORADOL ) 15 MG/ML injection 15 mg (15 mg Intravenous Given 01/14/24 0639)  ipratropium-albuterol  (DUONEB) 0.5-2.5 (3) MG/3ML nebulizer solution 3 mL (3 mLs Nebulization Given 01/14/24 9361)  sodium chloride  0.9 % bolus 1,000 mL (1,000 mLs Intravenous New Bag/Given 01/14/24 0742)     IMPRESSION / MDM / ASSESSMENT AND PLAN / ED COURSE                                Differential diagnosis includes, but is not limited to, CRPS, anemia, electrolyte derangement, fracture, DVT, URI, sepsis   ED course: Patient presents via EMS acutely screaming in pain.  She was surprisingly febrile and consequently blood work was obtained which is currently pending except for a CBC which demonstrated no leukocytosis.  Exam of her ankle demonstrates no evidence of purulence or surrounding cellulitis and her foot is well-perfused with less than 2-second cap refill throughout.  Given her fever, I did order a Doppler ultrasounds for DVT however suspicion is low based on exam.  To treat her pain I have given 1 mg of Dilaudid , Toradol .  She does have a history of asthma and I have written her for a DuoNeb as well.  Patient signed out to oncoming physician pending remainder of diagnostics and reassessment at 7:10 AM     -- Risk: 5 This patient has a high risk of morbidity due to further diagnostic testing or treatment. Rationale: This patients evaluation and management involve a high risk of morbidity due to the potential severity of presenting symptoms, need for  diagnostic testing, and/or initiation of treatment that may require close monitoring. The differential includes conditions with potential for significant deterioration or requiring escalation of care. Treatment decisions in the ED, including medication administration, procedural interventions, or disposition planning, reflect this level of risk. COPA: 5 The patient has the following acute or chronic illness/injury that poses a possible threat to life or bodily function: [X] : The patient has a potentially serious acute condition or an acute exacerbation of a chronic illness requiring urgent evaluation and management in the Emergency Department. The clinical  presentation necessitates immediate consideration of life-threatening or function-threatening diagnoses, even if they are ultimately ruled out.   FINAL CLINICAL IMPRESSION(S) / ED DIAGNOSES   Final diagnoses:  Chronic pain of left ankle  Fever, unspecified fever cause  Complex regional pain syndrome type 1, affecting unspecified site     Rx / DC Orders   ED Discharge Orders     None        Note:  This document was prepared using Dragon voice recognition software and may include unintentional dictation errors.   Nicholaus Rolland BRAVO, MD 01/14/24 9282    Nicholaus Rolland BRAVO, MD 01/14/24 [phone]  "

## 2024-01-14 NOTE — Telephone Encounter (Signed)
 Patient is request pain medication for break through pain patient is at emergency room now please review and advise.

## 2024-01-14 NOTE — Progress Notes (Signed)
 Patient presented to ED with acute pain to her left ankle. She underwent ORIF of the left ankle 12/20/23 and has existing CRPS. She feels that the pain is in a flare up. I prescribed oxycodone /acetaminophen  5/325 mg x 28 tabs every 4 hours as needed.  She is to maintain elevation, icing of the extremity.  Follow-up as scheduled.  Prentice Ovens, D.P.M.

## 2024-01-14 NOTE — ED Provider Notes (Addendum)
 7:07 AM Assumed care for off going team.   Blood pressure (!) 103/90, pulse (!) 114, temperature (!) 100.6 F (38.1 C), temperature source Oral, resp. rate 17, height 5' 5 (1.651 m), weight 84 kg, SpO2 92%.  See their HPI for full report but in brief pending blood workup.    I reviewed the discharge summary from 12/24/2023 where patient was admitted for a [place] left ankle fracture.  Pt was on levorphanol and continue on percocet 7.5. Continue on home dose of gabapentin .   Pt lactic elevated-- will start with some fluids.  Patient does have chronic tachycardia patient's home metoprolol  was also ordered.   7:56 AM reevaluated patient.  She reports continued pain in her foot secondary to her CRPS.  Discussed with patient her positive flu status.  Most likely the cause of patient's fever, tachycardia.  Reassessment patient has no significant wheezing feels better after breathing treatment.  States that at baseline she wears 3 L at home especially when she feels like she is going to be resting in bed (Which she is doing now)  She report that without oxygen her levels typically run between 90 and 94%.  Patient was placed on her home 3 L.  She really denies any symptoms related to the fluid knowing that she even had the flu.  12:19 PM discussed with patient she is requesting pain medications for home.  Patient's podiatrist actually reached out to me and he wrote her a prescription for pain medications I did alert her that this was the last 1 that he would be doing as per Dr Magdalen and additional medications would have to be done through her pain clinic.  She expressed understanding.  At we discussed Tamiflu but patient declined.  Patient reports feeling improved.  Patient will be discharged home.   Considered admission for patient but given she is on her home oxygen with oxygen levels that were around her baseline, she has had control of her pain, blood pressures improved with fluids I think is  reasonable for patient to be discharged home and patient also felt comfortable and preferred this plan.   Pt declined zofran  for flu.          Ernest Ronal BRAVO, MD 01/14/24 1221    Ernest Ronal BRAVO, MD 01/14/24 [phone]

## 2024-01-15 NOTE — Telephone Encounter (Signed)
 This is Katie Neal patient. Not sure what can be done for her. Needs to be sent to him

## 2024-01-22 ENCOUNTER — Emergency Department

## 2024-01-22 ENCOUNTER — Inpatient Hospital Stay
Admission: EM | Admit: 2024-01-22 | Discharge: 2024-01-25 | DRG: 193 | Disposition: A | Discharge status: Home / Self Care | Attending: Internal Medicine | Admitting: Internal Medicine

## 2024-01-22 ENCOUNTER — Other Ambulatory Visit: Payer: Self-pay

## 2024-01-22 DIAGNOSIS — Z79891 Long term (current) use of opiate analgesic: Secondary | ICD-10-CM

## 2024-01-22 DIAGNOSIS — G9059 Complex regional pain syndrome I of other specified site: Secondary | ICD-10-CM | POA: Diagnosis present

## 2024-01-22 DIAGNOSIS — Z79890 Hormone replacement therapy (postmenopausal): Secondary | ICD-10-CM

## 2024-01-22 DIAGNOSIS — E039 Hypothyroidism, unspecified: Secondary | ICD-10-CM | POA: Diagnosis present

## 2024-01-22 DIAGNOSIS — F32A Depression, unspecified: Secondary | ICD-10-CM | POA: Diagnosis present

## 2024-01-22 DIAGNOSIS — Z833 Family history of diabetes mellitus: Secondary | ICD-10-CM

## 2024-01-22 DIAGNOSIS — Z79899 Other long term (current) drug therapy: Secondary | ICD-10-CM

## 2024-01-22 DIAGNOSIS — J4489 Other specified chronic obstructive pulmonary disease: Secondary | ICD-10-CM | POA: Diagnosis present

## 2024-01-22 DIAGNOSIS — J440 Chronic obstructive pulmonary disease with acute lower respiratory infection: Secondary | ICD-10-CM | POA: Diagnosis present

## 2024-01-22 DIAGNOSIS — Z7985 Long-term (current) use of injectable non-insulin antidiabetic drugs: Secondary | ICD-10-CM

## 2024-01-22 DIAGNOSIS — J9621 Acute and chronic respiratory failure with hypoxia: Secondary | ICD-10-CM | POA: Diagnosis not present

## 2024-01-22 DIAGNOSIS — G90A Postural orthostatic tachycardia syndrome (POTS): Secondary | ICD-10-CM | POA: Diagnosis present

## 2024-01-22 DIAGNOSIS — Z9981 Dependence on supplemental oxygen: Secondary | ICD-10-CM

## 2024-01-22 DIAGNOSIS — J189 Pneumonia, unspecified organism: Secondary | ICD-10-CM | POA: Diagnosis present

## 2024-01-22 DIAGNOSIS — E785 Hyperlipidemia, unspecified: Secondary | ICD-10-CM | POA: Diagnosis present

## 2024-01-22 DIAGNOSIS — G9050 Complex regional pain syndrome I, unspecified: Secondary | ICD-10-CM | POA: Diagnosis present

## 2024-01-22 DIAGNOSIS — J9601 Acute respiratory failure with hypoxia: Secondary | ICD-10-CM | POA: Diagnosis present

## 2024-01-22 DIAGNOSIS — F418 Other specified anxiety disorders: Secondary | ICD-10-CM | POA: Diagnosis present

## 2024-01-22 DIAGNOSIS — Z8051 Family history of malignant neoplasm of kidney: Secondary | ICD-10-CM

## 2024-01-22 DIAGNOSIS — J111 Influenza due to unidentified influenza virus with other respiratory manifestations: Principal | ICD-10-CM

## 2024-01-22 DIAGNOSIS — Z7951 Long term (current) use of inhaled steroids: Secondary | ICD-10-CM

## 2024-01-22 DIAGNOSIS — E876 Hypokalemia: Secondary | ICD-10-CM | POA: Diagnosis present

## 2024-01-22 DIAGNOSIS — Z87442 Personal history of urinary calculi: Secondary | ICD-10-CM

## 2024-01-22 DIAGNOSIS — J8283 Eosinophilic asthma: Secondary | ICD-10-CM | POA: Diagnosis present

## 2024-01-22 DIAGNOSIS — F419 Anxiety disorder, unspecified: Secondary | ICD-10-CM | POA: Diagnosis present

## 2024-01-22 DIAGNOSIS — Z9181 History of falling: Secondary | ICD-10-CM

## 2024-01-22 DIAGNOSIS — J1100 Influenza due to unidentified influenza virus with unspecified type of pneumonia: Principal | ICD-10-CM | POA: Diagnosis present

## 2024-01-22 DIAGNOSIS — Z90710 Acquired absence of both cervix and uterus: Secondary | ICD-10-CM

## 2024-01-22 DIAGNOSIS — Z87891 Personal history of nicotine dependence: Secondary | ICD-10-CM

## 2024-01-22 DIAGNOSIS — G43909 Migraine, unspecified, not intractable, without status migrainosus: Secondary | ICD-10-CM | POA: Diagnosis present

## 2024-01-22 DIAGNOSIS — Z8619 Personal history of other infectious and parasitic diseases: Secondary | ICD-10-CM

## 2024-01-22 LAB — BASIC METABOLIC PANEL WITH GFR
Anion gap: 10 (ref 5–15)
BUN: 6 mg/dL (ref 6–20)
CO2: 28 mmol/L (ref 22–32)
Calcium: 8.3 mg/dL — ABNORMAL LOW (ref 8.9–10.3)
Chloride: 99 mmol/L (ref 98–111)
Creatinine, Ser: 0.55 mg/dL (ref 0.44–1.00)
GFR, Estimated: >60 mL/min
Glucose, Bld: 143 mg/dL — ABNORMAL HIGH (ref 70–99)
Potassium: 3.2 mmol/L — ABNORMAL LOW (ref 3.5–5.1)
Sodium: 136 mmol/L (ref 135–145)

## 2024-01-22 LAB — CBC
HCT: 34.3 % — ABNORMAL LOW (ref 36.0–46.0)
Hemoglobin: 11.4 g/dL — ABNORMAL LOW (ref 12.0–15.0)
MCH: 29.7 pg (ref 26.0–34.0)
MCHC: 33.2 g/dL (ref 30.0–36.0)
MCV: 89.3 fL (ref 80.0–100.0)
Platelets: 281 K/uL (ref 150–400)
RBC: 3.84 MIL/uL — ABNORMAL LOW (ref 3.87–5.11)
RDW: 14.1 % (ref 11.5–15.5)
WBC: 9.1 K/uL (ref 4.0–10.5)
nRBC: 0.2 % (ref 0.0–0.2)

## 2024-01-22 LAB — TROPONIN T, HIGH SENSITIVITY
Troponin T High Sensitivity: <15 ng/L (ref 0–19)
Troponin T High Sensitivity: <15 ng/L (ref 0–19)

## 2024-01-22 LAB — MAGNESIUM: Magnesium: 2.3 mg/dL (ref 1.7–2.4)

## 2024-01-22 LAB — PRO BRAIN NATRIURETIC PEPTIDE: Pro Brain Natriuretic Peptide: 258 pg/mL

## 2024-01-22 MED ORDER — LAMOTRIGINE 100 MG PO TABS
100.0000 mg | ORAL_TABLET | Freq: Every morning | ORAL | Status: DC
  Administered 2024-01-23 – 2024-01-25 (×3): 100 mg via ORAL
  Filled 2024-01-22 (×3): qty 1

## 2024-01-22 MED ORDER — LAMOTRIGINE 100 MG PO TABS: 100.0000 mg | ORAL_TABLET | ORAL | Status: DC

## 2024-01-22 MED ORDER — BUDESONIDE 0.25 MG/2ML IN SUSP
0.2500 mg | Freq: Two times a day (BID) | RESPIRATORY_TRACT | Status: DC
  Administered 2024-01-22 – 2024-01-25 (×6): 0.25 mg via RESPIRATORY_TRACT
  Filled 2024-01-22 (×6): qty 2

## 2024-01-22 MED ORDER — DOXEPIN HCL 10 MG PO CAPS
10.0000 mg | ORAL_CAPSULE | Freq: Every evening | ORAL | Status: DC | PRN
  Filled 2024-01-22: qty 1

## 2024-01-22 MED ORDER — OLOPATADINE HCL 0.1 % OP SOLN: 1.0000 [drp] | Freq: Two times a day (BID) | OPHTHALMIC | Status: DC | PRN

## 2024-01-22 MED ORDER — HYDROXYZINE HCL 25 MG PO TABS
25.0000 mg | ORAL_TABLET | Freq: Three times a day (TID) | ORAL | Status: DC | PRN
  Administered 2024-01-25: 25 mg via ORAL
  Filled 2024-01-22 (×2): qty 1

## 2024-01-22 MED ORDER — GUAIFENESIN ER 600 MG PO TB12
600.0000 mg | ORAL_TABLET | Freq: Two times a day (BID) | ORAL | Status: DC
  Administered 2024-01-22 – 2024-01-25 (×6): 600 mg via ORAL
  Filled 2024-01-22 (×5): qty 1

## 2024-01-22 MED ORDER — LINACLOTIDE 145 MCG PO CAPS
290.0000 ug | ORAL_CAPSULE | Freq: Every day | ORAL | Status: DC
  Administered 2024-01-22 – 2024-01-24 (×3): 290 ug via ORAL
  Filled 2024-01-22 (×3): qty 2

## 2024-01-22 MED ORDER — GUAIFENESIN-DM 100-10 MG/5ML PO SYRP: 10.0000 mL | ORAL_SOLUTION | ORAL | Status: DC | PRN

## 2024-01-22 MED ORDER — ACETAMINOPHEN 325 MG PO TABS: 650.0000 mg | ORAL_TABLET | Freq: Four times a day (QID) | ORAL | Status: DC | PRN

## 2024-01-22 MED ORDER — FLUTICASONE PROPIONATE 50 MCG/ACT NA SUSP: 2.0000 | Freq: Every day | NASAL | Status: DC | PRN

## 2024-01-22 MED ORDER — VANCOMYCIN HCL IN DEXTROSE 1-5 GM/200ML-% IV SOLN
1000.0000 mg | Freq: Once | INTRAVENOUS | Status: AC
  Administered 2024-01-22: 1000 mg via INTRAVENOUS
  Filled 2024-01-22: qty 200

## 2024-01-22 MED ORDER — POTASSIUM CHLORIDE CRYS ER 20 MEQ PO TBCR
40.0000 meq | EXTENDED_RELEASE_TABLET | Freq: Once | ORAL | Status: AC
  Administered 2024-01-22: 40 meq via ORAL
  Filled 2024-01-22: qty 2

## 2024-01-22 MED ORDER — SODIUM CHLORIDE 0.9 % IV SOLN
500.0000 mg | Freq: Once | INTRAVENOUS | Status: AC
  Administered 2024-01-22: 500 mg via INTRAVENOUS
  Filled 2024-01-22: qty 5

## 2024-01-22 MED ORDER — SODIUM CHLORIDE 0.9 % IV SOLN
1.0000 g | Freq: Once | INTRAVENOUS | Status: AC
  Administered 2024-01-22: 1 g via INTRAVENOUS
  Filled 2024-01-22: qty 10

## 2024-01-22 MED ORDER — ENOXAPARIN SODIUM 40 MG/0.4ML IJ SOSY
40.0000 mg | PREFILLED_SYRINGE | INTRAMUSCULAR | Status: DC
  Administered 2024-01-22 – 2024-01-24 (×3): 40 mg via SUBCUTANEOUS
  Filled 2024-01-22 (×2): qty 0.4

## 2024-01-22 MED ORDER — MORPHINE SULFATE (PF) 2 MG/ML IV SOLN: 2.0000 mg | INTRAVENOUS | Status: DC | PRN

## 2024-01-22 MED ORDER — ALBUTEROL SULFATE (2.5 MG/3ML) 0.083% IN NEBU
2.5000 mg | INHALATION_SOLUTION | RESPIRATORY_TRACT | Status: DC | PRN
  Administered 2024-01-23 – 2024-01-24 (×2): 2.5 mg via RESPIRATORY_TRACT
  Filled 2024-01-22 (×2): qty 3

## 2024-01-22 MED ORDER — SODIUM CHLORIDE 0.9 % IV SOLN
2.0000 g | INTRAVENOUS | Status: DC
  Administered 2024-01-22 – 2024-01-23 (×2): 2 g via INTRAVENOUS
  Filled 2024-01-22 (×3): qty 20

## 2024-01-22 MED ORDER — ACETAMINOPHEN 650 MG RE SUPP: 650.0000 mg | Freq: Four times a day (QID) | RECTAL | Status: DC | PRN

## 2024-01-22 MED ORDER — SODIUM CHLORIDE 0.9 % IV SOLN
500.0000 mg | INTRAVENOUS | Status: DC
  Administered 2024-01-22 – 2024-01-23 (×2): 500 mg via INTRAVENOUS
  Filled 2024-01-22 (×3): qty 5

## 2024-01-22 MED ORDER — LAMOTRIGINE 100 MG PO TABS
200.0000 mg | ORAL_TABLET | Freq: Every day | ORAL | Status: DC
  Administered 2024-01-22 – 2024-01-24 (×3): 200 mg via ORAL
  Filled 2024-01-22 (×2): qty 2

## 2024-01-22 MED ORDER — OXYCODONE-ACETAMINOPHEN 5-325 MG PO TABS: 1.0000 | ORAL_TABLET | ORAL | Status: DC | PRN

## 2024-01-22 MED ORDER — TIZANIDINE HCL 4 MG PO TABS
4.0000 mg | ORAL_TABLET | Freq: Three times a day (TID) | ORAL | Status: DC | PRN
  Administered 2024-01-23 – 2024-01-25 (×3): 4 mg via ORAL
  Filled 2024-01-22 (×4): qty 1

## 2024-01-22 MED ORDER — ONDANSETRON HCL 4 MG/2ML IJ SOLN: 4.0000 mg | Freq: Four times a day (QID) | INTRAMUSCULAR | Status: DC | PRN

## 2024-01-22 MED ORDER — ONDANSETRON HCL 4 MG PO TABS
4.0000 mg | ORAL_TABLET | Freq: Four times a day (QID) | ORAL | Status: DC | PRN
  Administered 2024-01-25: 4 mg via ORAL
  Filled 2024-01-22: qty 1

## 2024-01-22 NOTE — ED Triage Notes (Addendum)
 Pt has the flu since the 23rd but states her oxygen was 81-82% on 3 liter's [place] at home and she has [facility] and dizziness and chronic CP. Pt wears oxygen chronically when laying down/sleeping normally. Pt has taken full course of antibiotics. Unproductive, wet cough noted

## 2024-01-22 NOTE — ED Triage Notes (Signed)
 FIRST NURSE NOTE:   Pt BIB [facility] from home C/O SOB. Dx flu 1 week ago, reports worsening SOB. According to EMS, fire reported 82% on baseline 3L [place]. EMS gave duoneb and 125 mg Solumedrol en route. On arrival to ED, 92% on baseline 3L [place].    20 RAC 132/80 85 Cbg 174

## 2024-01-22 NOTE — ED Provider Notes (Signed)
 "  [facility] Provider Note    Event Date/Time   First MD Initiated Contact with Patient 01/22/24 1650     (approximate)   History   Chief Complaint Shortness of Breath   HPI  Katie Neal is a 53 y.o. female with past medical history of CRPS, hyperlipidemia, migraines, asthma, seizure, COPD, interstitial lung disease, chronic hypoxic respiratory failure on 3 L, and hypothyroidism who presents to the ED complaining of shortness of breath.  Patient reports that she initially tested positive for influenza 8 days ago here in the ED.  Since then, she has developed a productive cough with increasing difficulty breathing as well as generalized weakness and malaise.  She is not aware of any fevers and denies any nausea, vomiting, or diarrhea.  She was at physical therapy today and noted to have oxygen saturations dipping down into the 80s on her usual 3 L, subsequently referred to the ED.     Physical Exam   Triage Vital Signs: ED Triage Vitals  Encounter Vitals Group     BP 01/22/24 1341 128/80     Girls Systolic BP Percentile --      Girls Diastolic BP Percentile --      Boys Systolic BP Percentile --      Boys Diastolic BP Percentile --      Pulse Rate 01/22/24 1341 86     Resp 01/22/24 1341 19     Temp 01/22/24 1341 98.1 F (36.7 C)     Temp Source 01/22/24 1341 Oral     SpO2 01/22/24 1340 (!) 88 %     Weight 01/22/24 1342 185 lb (83.9 kg)     Height 01/22/24 1342 5' 5 (1.651 m)     Head Circumference --      Peak Flow --      Pain Score 01/22/24 1342 5     Pain Loc --      Pain Education --      Exclude from Growth Chart --     Most recent vital signs: Vitals:   01/22/24 1850 01/22/24 1945  BP: 113/75 119/82  Pulse: 70 69  Resp:    Temp:  98.6 F (37 C)  SpO2: 93% 92%    Constitutional: Alert and oriented. Eyes: Conjunctivae are normal. Head: Atraumatic. Nose: No congestion/rhinnorhea. Mouth/Throat: Mucous membranes are moist.   Cardiovascular: Normal rate, regular rhythm. Grossly normal heart sounds.  2+ radial pulses bilaterally. Respiratory: Normal respiratory effort.  No retractions. Lungs with coarse crackles throughout. Gastrointestinal: Soft and nontender. No distention. Musculoskeletal: No lower extremity tenderness nor edema.  Neurologic:  Normal speech and language. No gross focal neurologic deficits are appreciated.    ED Results / Procedures / Treatments   Labs (all labs ordered are listed, but only abnormal results are displayed) Labs Reviewed  BASIC METABOLIC PANEL WITH GFR - Abnormal; Notable for the following components:      Result Value   Potassium 3.2 (*)    Glucose, Bld 143 (*)    Calcium 8.3 (*)    All other components within normal limits  CBC - Abnormal; Notable for the following components:   RBC 3.84 (*)    Hemoglobin 11.4 (*)    HCT 34.3 (*)    All other components within normal limits  PRO BRAIN NATRIURETIC PEPTIDE  MAGNESIUM  TROPONIN T, HIGH SENSITIVITY  TROPONIN T, HIGH SENSITIVITY     EKG  ED ECG REPORT I, Carlin Palin, the attending  physician, personally viewed and interpreted this ECG.   Date: 01/22/2024  EKG Time: 13:40  Rate: 91  Rhythm: normal sinus rhythm  Axis: Normal  Intervals:Prolonged QT  ST&T Change: None  RADIOLOGY Chest x-ray reviewed and interpreted by me with bilateral hilar infiltrates concerning for pneumonia.  PROCEDURES:  Critical Care performed: No  Procedures   MEDICATIONS ORDERED IN ED: Medications  potassium chloride  SA (KLOR-CON  M) CR tablet 40 mEq (40 mEq Oral Given 01/22/24 1817)  cefTRIAXone  (ROCEPHIN ) 1 g in sodium chloride  0.9 % 100 mL IVPB (0 g Intravenous Stopped 01/22/24 1847)  azithromycin  (ZITHROMAX ) 500 mg in sodium chloride  0.9 % 250 mL IVPB (0 mg Intravenous Stopped 01/22/24 1922)  vancomycin  (VANCOCIN ) IVPB 1000 mg/200 mL premix (0 mg Intravenous Stopped 01/22/24 1922)     IMPRESSION / MDM / ASSESSMENT AND  PLAN / ED COURSE  I reviewed the triage vital signs and the nursing notes.                              53 y.o. female with past medical history of CRPS, hyperlipidemia, migraines, asthma, COPD, interstitial lung disease, hypothyroidism, seizures, and chronic hypoxic respiratory failure on 3 L who presents to the ED complaining of increasing shortness of breath and productive cough following recent diagnosis of influenza.  Patient's presentation is most consistent with acute presentation with potential threat to life or bodily function.  Differential diagnosis includes, but is not limited to, influenza, pneumonia, CHF, COPD exacerbation, ACS, PE, anemia, electrolyte abnormality, AKI.  Patient chronically ill but nontoxic-appearing and in no acute distress, vital signs are reassuring.  She is not in any respiratory distress and maintaining oxygen saturations at 92 to 93% on 3 L at the time of my evaluation, but did drop down into the 80s in triage.  EKG shows no evidence of arrhythmia or ischemia and troponin within normal limits, doubt ACS or PE.  Additional labs without significant anemia, leukocytosis, electrolyte abnormality, or AKI.  Chest x-ray does appear concerning for pneumonia, will cover for post flu pneumonia with ceftriaxone , azithromycin , and vancomycin .  CHF also considered but seems less likely at this time as patient does not appear clinically fluid overloaded.  BNP within normal limits and low suspicion for CHF.  Patient has significant dyspnea with any movement and case discussed with hospitalist for admission.      FINAL CLINICAL IMPRESSION(S) / ED DIAGNOSES   Final diagnoses:  Influenza  Pneumonia of both lungs due to infectious organism, unspecified part of lung     Rx / DC Orders   ED Discharge Orders     None        Note:  This document was prepared using Dragon voice recognition software and may include unintentional dictation errors.   Willo Dunnings,  MD 01/22/24 2007  "

## 2024-01-22 NOTE — ED Notes (Addendum)
 Pt moved to rm 38 via wheelchair by this tech. Pt taken to use restroom. Pt belongings left on bedside table.

## 2024-01-22 NOTE — Assessment & Plan Note (Signed)
 No acute issues Most recent fall was ago 11/2023 suffering ankle fracture Fall precautions and ask for assistance when getting up

## 2024-01-22 NOTE — Assessment & Plan Note (Addendum)
 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 

## 2024-01-22 NOTE — Assessment & Plan Note (Addendum)
 Symptomatic for pain, did dizziness, bladder dysfunction On chronic pain medicine: Oxycodone  and levorphanol No acute issues

## 2024-01-22 NOTE — Assessment & Plan Note (Signed)
 Continue hydroxyzine , doxepin 

## 2024-01-22 NOTE — H&P (Signed)
 " History and Physical    Patient: Katie Neal FMW:[id number] DOB: Dec 09, 1970 DOA: 01/22/2024 DOS: the patient was seen and examined on 01/22/2024 PCP: Rudolpho Norleen BIRCH, MD  Patient coming from: Home  Chief Complaint:  Chief Complaint  Patient presents with   Shortness of Breath    HPI: Katie Neal is a 53 y.o. 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  She was diagnosed with flu on 12/23 and treated however since then she has had increased shortness of breath.  While at physical therapy today she was desaturating to the mid 80s on her home flow rate of 3 L.  She was sent to the ED for evaluation On arrival in the ED satting at 88% on her home O2 with minimal exertion but she eventually settled to the low 90s once at rest.  Other vitals unremarkable.  Labs unremarkable except for potassium of 3.2 And a hemoglobin of 11.4 which is her baseline EKG nonacute and chest x-ray showing bilateral perihilar opacities concerning for pneumonia and possibly edema. Patient treated with Rocephin  azithromycin  and vancomycin  and given a dose of potassium Admission requested.     Past Medical History:  Diagnosis Date   Arthritis    Asthma    Cardiomegaly    Cerebral atrophy    Complex regional pain syndrome type 1    COPD (chronic obstructive pulmonary disease) (HCC)    CRPS (complex regional pain syndrome type I)    Depression    Diaphragmatic hernia    Dyspnea    Excessive falling    Headache    History of chronic sinusitis    History of kidney stones    Hyperlipidemia    Hypothyroidism    IBS (irritable bowel syndrome)    Insomnia    Memory difficulty    Osteoarthritis of knee    Pneumonia    Post concussion  syndrome    PTSD (post-traumatic stress disorder)    Seizure-like activity (HCC)    Sleep apnea    not on Cpap machine but is on oxygen    Urinary incontinence    Vasovagal syncope    Past Surgical History:  Procedure Laterality Date   ABDOMINAL HYSTERECTOMY     ACHILLES TENDON REPAIR  1982   APPENDECTOMY     CYSTOSCOPY WITH STENT PLACEMENT Right 06/27/2019   Procedure: CYSTOSCOPY WITH STENT PLACEMENT;  Surgeon: Carolee Sherwood BIRCH DOUGLAS, MD;  Location: [facility];  Service: Urology;  Laterality: Right;   CYSTOSCOPY/RETROGRADE/URETEROSCOPY  07/14/2019   Procedure: CYSTOSCOPY/RETROGRADE/URETEROSCOPY;  Surgeon: Twylla Glendia BROCKS, MD;  Location: [facility];  Service: Urology;;   CYSTOSCOPY/URETEROSCOPY/HOLMIUM LASER/STENT PLACEMENT Right 07/14/2019   Procedure: CYSTOSCOPY/URETEROSCOPY/HOLMIUM LASER/STENT Exchange;  Surgeon: Twylla Glendia BROCKS, MD;  Location: [facility];  Service: Urology;  Laterality: Right;   ESOPHAGOGASTRODUODENOSCOPY N/A 10/21/2020   Procedure: ESOPHAGOGASTRODUODENOSCOPY (EGD);  Surgeon: Maryruth Ole DASEN, MD;  Location[facility] ENDOSCOPY;  Service: Endoscopy;  Laterality: N/A;   FOOT SURGERY  2002   due to deformity   HIP SURGERY  2012   KNEE ARTHROSCOPY     LAPAROSCOPIC HYSTERECTOMY  2000   ORIF ANKLE FRACTURE Left 12/20/2023   Procedure: OPEN REDUCTION INTERNAL FIXATION (ORIF) ANKLE FRACTURE;  Surgeon: Magdalen Prentice PARAS, DPM;  Location: [facility] OR[facility];  Service: Orthopedics/Podiatry;  Laterality: Left;  General with pre-operative popliteal/saphenous nerve block   REPAIR TENDONS LEG     x2   Social History:  reports that she quit smoking about 10 years ago. Her smoking use included cigarettes. She has never used smokeless tobacco. She reports that she does not drink alcohol and does not use drugs.  Allergies[1]  Family History  Problem Relation Age of Onset   Diabetes Mellitus II Father    Kidney cancer Maternal Grandmother     Prior to Admission medications  Medication Sig Start Date  End Date Taking? Authorizing Provider  albuterol  (PROVENTIL ) (2.5 MG/3ML) 0.083% nebulizer solution Take 2.5 mg by nebulization every 6 (six) hours as needed for wheezing or shortness of breath.     [provider]  amitriptyline  (ELAVIL ) 25 MG tablet Take 25 mg by mouth at bedtime.     [provider]  budesonide (PULMICORT) 0.25 MG/2ML nebulizer solution Take 0.25 mg by nebulization 2 (two) times daily.    [provider]  budesonide-formoterol  (SYMBICORT) 80-4.5 MCG/ACT inhaler Inhale 2 puffs into the lungs 2 (two) times daily.    [provider]  dicyclomine  (BENTYL ) 20 MG tablet Take 20 mg by mouth 3 (three) times daily before meals.    [provider]  doxepin  (SINEQUAN ) 10 MG capsule Take 10 mg by mouth at bedtime as needed. 12/18/23   [provider]  EMGALITY  120 MG/ML SOAJ Inject 120 mg into the skin every 30 (thirty) days. 10/16/23   [provider]  fluticasone  (FLONASE ) 50 MCG/ACT nasal spray Place 2 sprays into both nostrils daily as needed.     [provider]  gabapentin  (NEURONTIN ) 100 MG capsule Take 100 mg by mouth 3 (three) times daily.    [provider]  hydrOXYzine  (ATARAX ) 25 MG tablet Take 25 mg by mouth 3 (three) times daily as needed. 09/17/23   [provider]  lamoTRIgine  (LAMICTAL ) 100 MG tablet Take 100-200 mg by mouth See admin instructions. Take 1 tablet (100mg ) by mouth every morning and take 2 tablets (200mg ) by mouth at bedtime    [provider]  levocetirizine (XYZAL ) 5 MG tablet SMARTSIG:1 Tablet(s) By Mouth Every Evening 04/12/19   [provider]  levothyroxine  (SYNTHROID , LEVOTHROID) 137 MCG tablet Take 1 tablet by mouth 6 days/week. Take half tablet (68.5 mcg) on Sunday. Take on an empty stomach with a glass of water at least 30-60 minutes before breakfast. 11/10/15   [provider]  linaclotide  (LINZESS ) 290 MCG CAPS capsule Take 1 capsule (290  mcg total) by mouth at bedtime for 1 day. 12/23/23 01/09/24  Trudy Anthony HERO, MD  Mepolizumab  (NUCALA ) 100 MG/ML SOAJ Inject 1 mL (100 mg total) into the skin every 28 (twenty-eight) days. 01/18/24   Trudy Anthony HERO, MD  metoprolol  succinate (TOPROL -XL) 25 MG 24 hr tablet Take 25 mg by mouth daily.    [provider]  montelukast  (SINGULAIR ) 10 MG tablet Take 10 mg by mouth daily. 12/06/15   [provider]  Multiple Vitamin (MULTIVITAMIN WITH MINERALS) TABS tablet Take 1 tablet by mouth daily.    [provider]  Olopatadine  HCl 0.7 % SOLN Place 1 drop into both eyes 2 (two) times daily as needed (allergy symptoms).     [provider]  ondansetron  (ZOFRAN -ODT) 4 MG disintegrating tablet Take 4 mg by mouth every 8 (eight) hours as needed for nausea. 01/13/15   [provider]  oxyCODONE -acetaminophen  (ENDOCET) 5-325 MG tablet Take 1-2 tablets by mouth  every 4 (four) hours as needed for up to 28 doses for severe pain (pain score 7-10). 01/14/24   Magdalen Prentice PARAS, DPM  pantoprazole  (PROTONIX ) 40 MG tablet Take 40 mg by mouth daily.    [provider]  potassium chloride  (KLOR-CON ) 10 MEQ tablet Take 10 mEq by mouth once. 12/11/23 12/10/24  [provider]  predniSONE  (DELTASONE ) 20 MG tablet Take 20 mg by mouth daily. 12/16/23   [provider]  QUEtiapine  (SEROQUEL ) 25 MG tablet Take 25 mg by mouth at bedtime. 06/05/19   [provider]  simvastatin  (ZOCOR ) 10 MG tablet Take 10 mg by mouth at bedtime.     [provider]  tiotropium (SPIRIVA ) 18 MCG inhalation capsule Place 18 mcg into inhaler and inhale daily.    [provider]  tirzepatide  (ZEPBOUND ) 5 MG/0.5ML Pen Inject 5 mg into the skin once a week. 12/13/23   [provider]  tiZANidine  (ZANAFLEX ) 4 MG tablet Take 4 mg by mouth every 8 (eight) hours as needed for muscle spasms.    [provider]  traZODone  (DESYREL ) 100  MG tablet Take 2 tablets (200 mg total) by mouth at bedtime. Needs office visit 12/14/11   Hadassah Powell HERO, PA-C  venlafaxine  XR (EFFEXOR -XR) 75 MG 24 hr capsule Take 225 mg by mouth daily with breakfast.    [provider]  VENTOLIN  HFA 108 (90 Base) MCG/ACT inhaler Inhale 2 puffs into the lungs every 6 (six) hours as needed for wheezing. 11/18/23   [provider]  vitamin C (ASCORBIC ACID ) 500 MG tablet Take 1,000 mg by mouth daily.    [provider]  Vitamin D , Ergocalciferol , (DRISDOL ) 1.25 MG (50000 UNIT) CAPS capsule Take 1 capsule (50,000 Units total) by mouth every 7 (seven) days for 12 doses. 12/20/23 03/07/24  Magdalen Prentice PARAS, DPM    Physical Exam: Vitals:   01/22/24 1826 01/22/24 1830 01/22/24 1850 01/22/24 1945  BP:  114/68 113/75 119/82  Pulse:  70 70 69  Resp:      Temp: 98.4 F (36.9 C)   98.6 F (37 C)  TempSrc: Oral   Oral  SpO2:  93% 93% 92%  Weight:      Height:       Physical Exam Vitals and nursing note reviewed.  Constitutional:      General: She is not in acute distress. HENT:     Head: Normocephalic and atraumatic.  Cardiovascular:     Rate and Rhythm: Normal rate and regular rhythm.     Heart sounds: Normal heart sounds.  Pulmonary:     Effort: Tachypnea present.     Breath sounds: Normal breath sounds.  Abdominal:     Palpations: Abdomen is soft.     Tenderness: There is no abdominal tenderness.  Neurological:     Mental Status: Mental status is at baseline.     Labs on Admission: I have personally reviewed following labs and imaging studies  CBC: Recent Labs  Lab 01/22/24 1346  WBC 9.1  HGB 11.4*  HCT 34.3*  MCV 89.3  PLT 281   Basic Metabolic Panel: Recent Labs  Lab 01/22/24 1346 01/22/24 1546  NA 136  --   K 3.2*  --   CL 99  --   CO2 28  --   GLUCOSE 143*  --   BUN 6  --   CREATININE 0.55  --   CALCIUM 8.3*  --   MG  --  2.3  GFR: Estimated Creatinine Clearance: 87 mL/min (by C-G formula  based on SCr of 0.55 mg/dL). Liver Function Tests: No results for input(s): AST, ALT, ALKPHOS, BILITOT, PROT, ALBUMIN in the last 168 hours. No results for input(s): LIPASE, AMYLASE in the last 168 hours. No results for input(s): AMMONIA in the last 168 hours. Coagulation Profile: No results for input(s): INR, PROTIME in the last 168 hours. Cardiac Enzymes: No results for input(s): CKTOTAL, CKMB, CKMBINDEX, TROPONINI in the last 168 hours. BNP (last 3 results) Recent Labs    01/22/24 1546  PROBNP 258.0   HbA1C: No results for input(s): HGBA1C in the last 72 hours. CBG: No results for input(s): GLUCAP in the last 168 hours. Lipid Profile: No results for input(s): CHOL, HDL, LDLCALC, TRIG, CHOLHDL, LDLDIRECT in the last 72 hours. Thyroid  Function Tests: No results for input(s): TSH, T4TOTAL, FREET4, T3FREE, THYROIDAB in the last 72 hours. Anemia Panel: No results for input(s): VITAMINB12, FOLATE, FERRITIN, TIBC, IRON, RETICCTPCT in the last 72 hours. Urine analysis:    Component Value Date/Time   COLORURINE YELLOW (A) 01/14/2024 1115   APPEARANCEUR HAZY (A) 01/14/2024 1115   APPEARANCEUR Cloudy (A) 08/07/2019 1436   LABSPEC 1.013 01/14/2024 1115   PHURINE 5.0 01/14/2024 1115   GLUCOSEU NEGATIVE 01/14/2024 1115   HGBUR NEGATIVE 01/14/2024 1115   BILIRUBINUR NEGATIVE 01/14/2024 1115   BILIRUBINUR Negative 08/07/2019 1436   KETONESUR NEGATIVE 01/14/2024 1115   PROTEINUR NEGATIVE 01/14/2024 1115   UROBILINOGEN 0.2 03/02/2009 1415   NITRITE NEGATIVE 01/14/2024 1115   LEUKOCYTESUR SMALL (A) 01/14/2024 1115    Radiological Exams on Admission: DG Chest 2 View Result Date: 01/22/2024 CLINICAL DATA:  Shortness of breath EXAM: DG CHEST 2V COMPARISON:  Eight days ago FINDINGS: Stable cardiomediastinal silhouette. Interval development of bilateral perihilar opacities concerning for pneumonia or possibly edema. Bony  thorax is unremarkable. IMPRESSION: Interval development of bilateral perihilar opacities concerning for pneumonia or possibly edema. Electronically Signed   By: Lynwood Landy Raddle M.D.   On: 01/22/2024 14:20   Data Reviewed for HPI: Relevant notes from primary care and specialist visits, past discharge summaries as available in EHR, including Care Everywhere. 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 Acute on chronic respiratory failure with hypoxia Presents with increased shortness of breath in the setting of recent influenza, chest x-ray concerning for pneumonia O2 sats 88% on home flow rate of 3 L Rocephin  and azithromycin  Continue home Pulmicort  DuoNebs as needed Continue Flonase  and Xyzal   and Spiriva  Also on monthly Nucala   Personal history of falls/POTS No acute issues Most recent fall was ago 11/2023 suffering ankle fracture Fall precautions and ask for assistance when getting up  CRPS (complex regional pain syndrome type I) Symptomatic for pain, did dizziness, bladder dysfunction On chronic pain medicine: Oxycodone  and levorphanol No acute issues  Hypothyroidism Continue levothyroxine   Migraine No acute issues Currently on Emgality   Depression with anxiety Continue hydroxyzine , doxepin       DVT prophylaxis: Lovenox   Consults: none  Advance Care Planning:   Code Status: Prior   Family Communication: none  Disposition Plan: Back to previous home environment  Severity of Illness: The appropriate patient status for  this patient is OBSERVATION. Observation status  is judged to be reasonable and necessary in order to provide the required intensity of service to ensure the patient's safety. The patient's presenting symptoms, physical exam findings, and initial radiographic and laboratory data in the context of their medical condition is felt to place them at decreased risk for further clinical deterioration. Furthermore, it is anticipated that the patient will be medically stable for discharge from the hospital within 2 midnights of admission.   Author: Delayne LULLA Solian, MD 01/22/2024 8:02 PM  For on call review [email or web address].      [1]  Allergies Allergen Reactions   Codeine Itching    Can take with benadryl     "

## 2024-01-22 NOTE — Assessment & Plan Note (Signed)
 No acute issues Currently on Emgality 

## 2024-01-22 NOTE — Assessment & Plan Note (Signed)
"   Continue levothyroxine          "

## 2024-01-23 DIAGNOSIS — E876 Hypokalemia: Secondary | ICD-10-CM | POA: Diagnosis present

## 2024-01-23 DIAGNOSIS — J9621 Acute and chronic respiratory failure with hypoxia: Secondary | ICD-10-CM | POA: Diagnosis present

## 2024-01-23 DIAGNOSIS — G90A Postural orthostatic tachycardia syndrome (POTS): Secondary | ICD-10-CM | POA: Diagnosis present

## 2024-01-23 DIAGNOSIS — Z79899 Other long term (current) drug therapy: Secondary | ICD-10-CM | POA: Diagnosis not present

## 2024-01-23 DIAGNOSIS — G43909 Migraine, unspecified, not intractable, without status migrainosus: Secondary | ICD-10-CM | POA: Diagnosis present

## 2024-01-23 DIAGNOSIS — J9601 Acute respiratory failure with hypoxia: Secondary | ICD-10-CM | POA: Diagnosis present

## 2024-01-23 DIAGNOSIS — E039 Hypothyroidism, unspecified: Secondary | ICD-10-CM | POA: Diagnosis present

## 2024-01-23 DIAGNOSIS — Z9981 Dependence on supplemental oxygen: Secondary | ICD-10-CM | POA: Diagnosis not present

## 2024-01-23 DIAGNOSIS — Z90710 Acquired absence of both cervix and uterus: Secondary | ICD-10-CM | POA: Diagnosis not present

## 2024-01-23 DIAGNOSIS — Z833 Family history of diabetes mellitus: Secondary | ICD-10-CM | POA: Diagnosis not present

## 2024-01-23 DIAGNOSIS — Z79890 Hormone replacement therapy (postmenopausal): Secondary | ICD-10-CM | POA: Diagnosis not present

## 2024-01-23 DIAGNOSIS — F419 Anxiety disorder, unspecified: Secondary | ICD-10-CM | POA: Diagnosis present

## 2024-01-23 DIAGNOSIS — J440 Chronic obstructive pulmonary disease with acute lower respiratory infection: Secondary | ICD-10-CM | POA: Diagnosis present

## 2024-01-23 DIAGNOSIS — Z7985 Long-term (current) use of injectable non-insulin antidiabetic drugs: Secondary | ICD-10-CM | POA: Diagnosis not present

## 2024-01-23 DIAGNOSIS — Z79891 Long term (current) use of opiate analgesic: Secondary | ICD-10-CM | POA: Diagnosis not present

## 2024-01-23 DIAGNOSIS — G9059 Complex regional pain syndrome I of other specified site: Secondary | ICD-10-CM | POA: Diagnosis present

## 2024-01-23 DIAGNOSIS — J8283 Eosinophilic asthma: Secondary | ICD-10-CM | POA: Diagnosis present

## 2024-01-23 DIAGNOSIS — Z87891 Personal history of nicotine dependence: Secondary | ICD-10-CM | POA: Diagnosis not present

## 2024-01-23 DIAGNOSIS — F32A Depression, unspecified: Secondary | ICD-10-CM | POA: Diagnosis present

## 2024-01-23 DIAGNOSIS — Z87442 Personal history of urinary calculi: Secondary | ICD-10-CM | POA: Diagnosis not present

## 2024-01-23 DIAGNOSIS — Z8051 Family history of malignant neoplasm of kidney: Secondary | ICD-10-CM | POA: Diagnosis not present

## 2024-01-23 DIAGNOSIS — E785 Hyperlipidemia, unspecified: Secondary | ICD-10-CM | POA: Diagnosis present

## 2024-01-23 DIAGNOSIS — J1100 Influenza due to unidentified influenza virus with unspecified type of pneumonia: Secondary | ICD-10-CM | POA: Diagnosis present

## 2024-01-23 DIAGNOSIS — Z7951 Long term (current) use of inhaled steroids: Secondary | ICD-10-CM | POA: Diagnosis not present

## 2024-01-23 DIAGNOSIS — R0602 Shortness of breath: Secondary | ICD-10-CM | POA: Diagnosis present

## 2024-01-23 DIAGNOSIS — Z9181 History of falling: Secondary | ICD-10-CM | POA: Diagnosis not present

## 2024-01-23 LAB — BASIC METABOLIC PANEL WITH GFR
Anion gap: 11 (ref 5–15)
BUN: 10 mg/dL (ref 6–20)
CO2: 27 mmol/L (ref 22–32)
Calcium: 8.5 mg/dL — ABNORMAL LOW (ref 8.9–10.3)
Chloride: 100 mmol/L (ref 98–111)
Creatinine, Ser: 0.62 mg/dL (ref 0.44–1.00)
GFR, Estimated: >60 mL/min
Glucose, Bld: 231 mg/dL — ABNORMAL HIGH (ref 70–99)
Potassium: 3.4 mmol/L — ABNORMAL LOW (ref 3.5–5.1)
Sodium: 137 mmol/L (ref 135–145)

## 2024-01-23 MED ORDER — QUETIAPINE FUMARATE 25 MG PO TABS
25.0000 mg | ORAL_TABLET | Freq: Every day | ORAL | Status: DC
  Administered 2024-01-23 – 2024-01-24 (×2): 25 mg via ORAL
  Filled 2024-01-23: qty 1

## 2024-01-23 MED ORDER — TRAZODONE HCL 100 MG PO TABS
200.0000 mg | ORAL_TABLET | Freq: Every day | ORAL | Status: DC
  Administered 2024-01-23 – 2024-01-24 (×2): 200 mg via ORAL
  Filled 2024-01-23: qty 2

## 2024-01-23 MED ORDER — AMITRIPTYLINE HCL 25 MG PO TABS
25.0000 mg | ORAL_TABLET | Freq: Every day | ORAL | Status: DC
  Administered 2024-01-23 – 2024-01-24 (×2): 25 mg via ORAL
  Filled 2024-01-23 (×3): qty 1

## 2024-01-23 NOTE — Plan of Care (Signed)
  Problem: Clinical Measurements: Goal: Ability to maintain clinical measurements within normal limits will improve Outcome: Progressing   Problem: Pain Managment: Goal: General experience of comfort will improve and/or be controlled Outcome: Progressing   Problem: Safety: Goal: Ability to remain free from injury will improve Outcome: Progressing   Problem: Skin Integrity: Goal: Risk for impaired skin integrity will decrease Outcome: Progressing

## 2024-01-23 NOTE — Progress Notes (Signed)
 " Progress Note   Patient: CELESE BANNER FMW:[id number] DOB: 1970/10/08 DOA: 01/22/2024     0 DOS: the patient was seen and examined on 01/23/2024   Brief hospital course: From HPI Katie Neal is a 54 y.o. 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  She was diagnosed with flu on 12/23 and treated however since then she has had increased shortness of breath.  While at physical therapy today she was desaturating to the mid 80s on her home flow rate of 3 L.  She was sent to the ED for evaluation On arrival in the ED satting at 88% on her home O2 with minimal exertion but she eventually settled to the low 90s once at rest.  Other vitals unremarkable.  Labs unremarkable except for potassium of 3.2 And a hemoglobin of 11.4 which is her baseline EKG nonacute and chest x-ray showing bilateral perihilar opacities concerning for pneumonia and possibly edema. Patient treated with Rocephin  azithromycin  and vancomycin  and given a dose of potassium Admission requested.      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 Acute on chronic respiratory failure with hypoxia Presents with increased shortness of breath in the setting of recent influenza, chest x-ray concerning for pneumonia O2 sats 88% on home flow rate of 3 L Rocephin  and azithromycin  Continue home Pulmicort  DuoNebs as needed Continue Flonase  and Xyzal   and Spiriva  Also on monthly Nucala    Hypokalemia-continue repletion and monitoring  Personal history of falls/POTS No acute issues Most recent fall was ago 11/2023 suffering ankle fracture Fall precautions and ask for assistance when  getting up   CRPS (complex regional pain syndrome type I) Symptomatic for pain, did dizziness, bladder dysfunction On chronic pain medicine: Oxycodone  and levorphanol No acute issues   Hypothyroidism Continue levothyroxine    Migraine No acute issues Currently on Emgality    Depression with anxiety Continue hydroxyzine , doxepin    DVT prophylaxis: Lovenox    Consults: none   Advance Care Planning:   Code Status: Prior    Family Communication: none   Disposition Plan: Back to previous home environment  Subjective:  Patient seen and examined at bedside this morning Still requiring 4 L of oxygen which is above her baseline Denies chest pain or abdominal pain  Physical Exam: Vitals and nursing note reviewed.  Constitutional:      General: She is not in acute distress. HENT:     Head: Normocephalic and atraumatic.  Cardiovascular:     Rate and Rhythm: Normal rate and regular rhythm.     Heart sounds: Normal heart sounds.  Pulmonary:     Effort: Tachypnea present.     Breath sounds: Normal breath sounds.  Abdominal:     Palpations: Abdomen is soft.     Tenderness: There is no abdominal tenderness.  Neurological:     Mental Status: Mental status is at baseline.    Data Reviewed:    Latest Ref Rng & Units 01/22/2024    1:46 PM 01/14/2024    6:28 AM 12/19/2023    4:53 AM  CBC  WBC 4.0 - 10.5 K/uL 9.1  7.3  6.9   Hemoglobin 12.0 - 15.0 g/dL 88.5  87.9  87.1   Hematocrit 36.0 - 46.0 % 34.3  38.1  37.0   Platelets 150 - 400 K/uL 281  152  193        Latest Ref Rng & Units 01/23/2024   11:08 AM 01/22/2024    1:46 PM 01/14/2024    6:28 AM  BMP  Glucose 70 - 99 mg/dL 768  856  899   BUN 6 - 20 mg/dL 10  6  12    Creatinine 0.44 - 1.00 mg/dL 9.37  9.44  8.77   Sodium 135 - 145 mmol/L 137  136  134   Potassium 3.5 - 5.1 mmol/L 3.4  3.2  4.2   Chloride 98 - 111 mmol/L 100  99  95   CO2 22 - 32 mmol/L 27  28  26    Calcium 8.9 - 10.3 mg/dL 8.5  8.3  8.6       Vitals:   01/23/24 0130 01/23/24 0207 01/23/24 0700 01/23/24 0859  BP: 133/87 137/77  138/82  Pulse: 68 67  (!) 54  Resp: 20 17  20   Temp:  98.2 F (36.8 C)  98.2 F (36.8 C)  TempSrc:  Oral  Oral  SpO2: 93% 94% 91% 96%  Weight:      Height:         Author: Drue ONEIDA Potter, MD 01/23/2024 1:40 PM  For on call review [email or web address].  "

## 2024-01-23 NOTE — Plan of Care (Signed)
   Problem: Clinical Measurements: Goal: Ability to maintain clinical measurements within normal limits will improve Outcome: Progressing Goal: Will remain free from infection Outcome: Progressing

## 2024-01-24 ENCOUNTER — Other Ambulatory Visit: Payer: Self-pay

## 2024-01-24 MED ORDER — VENLAFAXINE HCL ER 75 MG PO CP24
225.0000 mg | ORAL_CAPSULE | Freq: Every day | ORAL | Status: DC
  Administered 2024-01-25: 225 mg via ORAL
  Filled 2024-01-24: qty 3

## 2024-01-24 MED ORDER — METOPROLOL SUCCINATE ER 25 MG PO TB24
25.0000 mg | ORAL_TABLET | Freq: Every day | ORAL | Status: DC
  Administered 2024-01-24 – 2024-01-25 (×2): 25 mg via ORAL
  Filled 2024-01-24 (×2): qty 1

## 2024-01-24 MED ORDER — ADULT MULTIVITAMIN W/MINERALS CH
1.0000 | ORAL_TABLET | Freq: Every day | ORAL | Status: DC
  Administered 2024-01-24 – 2024-01-25 (×2): 1 via ORAL
  Filled 2024-01-24 (×2): qty 1

## 2024-01-24 MED ORDER — DICYCLOMINE HCL 20 MG PO TABS
20.0000 mg | ORAL_TABLET | Freq: Three times a day (TID) | ORAL | Status: DC
  Administered 2024-01-24 – 2024-01-25 (×2): 20 mg via ORAL
  Filled 2024-01-24 (×6): qty 1

## 2024-01-24 MED ORDER — VITAMIN D (ERGOCALCIFEROL) 1.25 MG (50000 UNIT) PO CAPS: 50000.0000 [IU] | ORAL_CAPSULE | ORAL | Status: DC

## 2024-01-24 MED ORDER — PANTOPRAZOLE SODIUM 40 MG PO TBEC
40.0000 mg | DELAYED_RELEASE_TABLET | Freq: Two times a day (BID) | ORAL | Status: DC
  Administered 2024-01-24 – 2024-01-25 (×3): 40 mg via ORAL
  Filled 2024-01-24 (×2): qty 1

## 2024-01-24 MED ORDER — GABAPENTIN 100 MG PO CAPS
100.0000 mg | ORAL_CAPSULE | Freq: Every day | ORAL | Status: DC
  Administered 2024-01-24 – 2024-01-25 (×2): 100 mg via ORAL
  Filled 2024-01-24 (×2): qty 1

## 2024-01-24 MED ORDER — VITAMIN C 500 MG PO TABS
1000.0000 mg | ORAL_TABLET | Freq: Every day | ORAL | Status: DC
  Administered 2024-01-24 – 2024-01-25 (×2): 1000 mg via ORAL
  Filled 2024-01-24 (×2): qty 2

## 2024-01-24 MED ORDER — SIMVASTATIN 20 MG PO TABS
10.0000 mg | ORAL_TABLET | Freq: Every day | ORAL | Status: DC
  Administered 2024-01-24: 10 mg via ORAL

## 2024-01-24 MED ORDER — POTASSIUM CHLORIDE CRYS ER 20 MEQ PO TBCR
40.0000 meq | EXTENDED_RELEASE_TABLET | Freq: Once | ORAL | Status: AC
  Administered 2024-01-24: 40 meq via ORAL
  Filled 2024-01-24: qty 2

## 2024-01-24 MED ORDER — LORATADINE 10 MG PO TABS: 5.0000 mg | ORAL_TABLET | Freq: Every evening | ORAL | Status: DC

## 2024-01-24 MED ORDER — LEVOTHYROXINE SODIUM 137 MCG PO TABS
137.0000 ug | ORAL_TABLET | Freq: Every day | ORAL | Status: DC
  Administered 2024-01-25: 137 ug via ORAL
  Filled 2024-01-24: qty 1

## 2024-01-24 MED ORDER — AMOXICILLIN-POT CLAVULANATE 875-125 MG PO TABS
1.0000 | ORAL_TABLET | Freq: Two times a day (BID) | ORAL | 0 refills | Status: AC
  Filled 2024-01-24: qty 10, 5d supply, fill #0

## 2024-01-24 NOTE — TOC Transition Note (Signed)
 Transition of Care [name]) - Discharge Note   Patient Details  Name: Katie Neal MRN: [id number] Date of Birth: 26-Dec-1970  [facility] CM/SW Contact:  Alfonso Rummer, LCSW Phone Number: 01/24/2024, 4:07 PM   Clinical Narrative:     Pt is discharge home with self care. TOC ordered o2 with [employer] [facility]. TOC communicated with pt daughter of discharge. Daughter reports she was in th room waiting for discharge. No further TOC needs identified.   Final next level of care: Home/Self Care Barriers to Discharge: Barriers Resolved   Patient Goals and CMS Choice            Discharge Placement                       Discharge Plan and Services Additional resources added to the After Visit Summary for                  DME Arranged: Oxygen DME Agency: [employer] Date DME Agency Contacted: 01/24/24   Representative spoke with at DME Agency: Thomasina [place]            Social Drivers of Health (SDOH) Interventions SDOH Screenings   Food Insecurity: No Food Insecurity (01/23/2024)  Housing: Low Risk (01/23/2024)  Transportation Needs: Unmet Transportation Needs (01/23/2024)  Utilities: Not At Risk (01/23/2024)  Tobacco Use: Medium Risk (01/22/2024)     Readmission Risk Interventions    12/21/2023    4:00 PM  Readmission Risk Prevention Plan  Transportation Screening Complete  Medication Review (RN Care Manager) Complete  PCP or Specialist appointment within 3-5 days of discharge Complete  HRI or Home Care Consult Complete  SW Recovery Care/Counseling Consult Complete  Palliative Care Screening Not Applicable  Skilled Nursing Facility Not Applicable

## 2024-01-24 NOTE — Progress Notes (Signed)
 SATURATION QUALIFICATIONS:   Patient Saturations on Room Air at Rest = 90%   Patient Saturations on [employer] while Stand, [employer], Transfer to the Bedside Commode = 85%   Patient Saturations on 4 Liters of oxygen while Transferring = 95%   Patient needs 4 L of oxygen continuously.

## 2024-01-24 NOTE — Discharge Summary (Signed)
 " Physician Discharge Summary   Patient: Katie Neal MRN: [id number] DOB: 10/07/1970  Admit date:     01/22/2024  Discharge date: 01/24/2024  Discharge Physician: Drue ONEIDA Potter   PCP: Rudolpho Norleen BIRCH, MD   Recommendations at discharge:    Discharge Diagnoses: Principal Problem:   Acute on chronic respiratory failure with hypoxia [facility]) Active Problems:   CAP, post influenza (community acquired pneumonia)   COPD with asthma (HCC)   Personal history of falls/POTS   CRPS (complex regional pain syndrome type I)   Hypothyroidism   Migraine   Depression with anxiety   Acute respiratory failure with hypoxia (HCC)  Resolved Problems:   * No resolved hospital problems. [place] [facility] Course:  From HPI Katie Neal is a 54 y.o. 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  She was diagnosed with flu on 12/23 and treated however since then she has had increased shortness of breath.  While at physical therapy today she was desaturating to the mid 80s on her home flow rate of 3 L.  She was sent to the ED for evaluation On arrival in the ED satting at 88% on her home O2 with minimal exertion but she eventually settled to the low 90s once at rest.  Other vitals unremarkable.  Labs unremarkable except for potassium of 3.2 And a hemoglobin of 11.4 which is her baseline EKG nonacute and chest x-ray showing bilateral perihilar opacities concerning for pneumonia and possibly edema. Patient treated with Rocephin  azithromycin  and vancomycin  and given a dose of potassium Admission requested.     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 Acute on  chronic respiratory failure with hypoxia Surgery requirement have improved to her home oxygen requirement of 3/4 L Patient is therefore being discharged today to complete antibiotic course Continue home Pulmicort  DuoNebs as needed Continue Flonase  and Xyzal   and Spiriva  Also on monthly Nucala    Hypokalemia-continue repletion and monitoring   Personal history of falls/POTS No acute issues Most recent fall was ago 11/2023 suffering ankle fracture Fall precautions and ask for assistance when getting up   CRPS (complex regional pain syndrome type I) Symptomatic for pain, did dizziness, bladder dysfunction On chronic pain medicine: Oxycodone  and levorphanol No acute issues   Hypothyroidism Continue levothyroxine    Migraine No acute issues Currently on Emgality    Depression with anxiety Continue hydroxyzine , doxepin    Consultants: None Procedures performed: None Disposition: Home Diet recommendation:  Cardiac diet DISCHARGE MEDICATION: Allergies as of 01/24/2024       Reactions   Codeine Itching   Can take with benadryl         Medication List     TAKE these medications    albuterol  (2.5 MG/3ML) 0.083% nebulizer solution Commonly known as: PROVENTIL  Take 2.5 mg by nebulization every 6 (six) hours as needed for wheezing or shortness of breath.   Ventolin  HFA 108 (90 Base) MCG/ACT inhaler Generic drug: albuterol  Inhale 2 puffs into the lungs every 6 (six) hours as needed for wheezing.   amitriptyline  25 MG tablet Commonly known as: ELAVIL  Take 25 mg by mouth at bedtime.   amoxicillin-clavulanate 875-125 MG tablet Commonly known as: AUGMENTIN Take 1 tablet by mouth 2 (two) times daily for  5 days.   ascorbic acid  500 MG tablet Commonly known as: VITAMIN C Take 1,000 mg by mouth daily.   budesonide 0.25 MG/2ML nebulizer solution Commonly known as: PULMICORT Take 0.25 mg by nebulization 2 (two) times daily.   budesonide-formoterol  80-4.5 MCG/ACT  inhaler Commonly known as: SYMBICORT Inhale 2 puffs into the lungs 2 (two) times daily.   dicyclomine  20 MG tablet Commonly known as: BENTYL  Take 20 mg by mouth 3 (three) times daily before meals.   doxepin  10 MG capsule Commonly known as: SINEQUAN  Take 10 mg by mouth at bedtime as needed.   Emgality  120 MG/ML Soaj Generic drug: Galcanezumab -gnlm Inject 120 mg into the skin every 30 (thirty) days.   fluticasone  50 MCG/ACT nasal spray Commonly known as: FLONASE  Place 2 sprays into both nostrils daily as needed.   gabapentin  100 MG capsule Commonly known as: NEURONTIN  Take 100 mg by mouth daily.   gabapentin  100 MG capsule Commonly known as: NEURONTIN  Take 300 mg by mouth at bedtime.   guaiFENesin  600 MG 12 hr tablet Commonly known as: MUCINEX  Take 600 mg by mouth 2 (two) times daily.   hydrOXYzine  25 MG tablet Commonly known as: ATARAX  Take 25 mg by mouth 3 (three) times daily as needed.   lamoTRIgine  100 MG tablet Commonly known as: LAMICTAL  Take 100-200 mg by mouth See admin instructions. Take 1 tablet (100mg ) by mouth every morning and take 2 tablets (200mg ) by mouth at bedtime   levocetirizine 5 MG tablet Commonly known as: XYZAL  SMARTSIG:1 Tablet(s) By Mouth Every Evening   levorphanol 2 MG tablet Commonly known as: LEVODROMORAN Take 2 mg by mouth every 8 (eight) hours as needed.   levothyroxine  137 MCG tablet Commonly known as: SYNTHROID  Take 1 tablet by mouth 6 days/week. Take half tablet (68.5 mcg) on Sunday. Take on an empty stomach with a glass of water at least 30-60 minutes before breakfast.   linaclotide  290 MCG Caps capsule Commonly known as: LINZESS  Take 1 capsule (290 mcg total) by mouth at bedtime for 1 day.   metoprolol  succinate 25 MG 24 hr tablet Commonly known as: TOPROL -XL Take 25 mg by mouth daily.   montelukast  10 MG tablet Commonly known as: SINGULAIR  Take 10 mg by mouth daily.   multivitamin with minerals Tabs tablet Take 1  tablet by mouth daily.   Nucala  100 MG/ML Soaj Generic drug: Mepolizumab  Inject 1 mL (100 mg total) into the skin every 28 (twenty-eight) days.   Olopatadine  HCl 0.7 % Soln Place 1 drop into both eyes 2 (two) times daily as needed (allergy symptoms).   ondansetron  4 MG disintegrating tablet Commonly known as: ZOFRAN -ODT Take 4 mg by mouth every 8 (eight) hours as needed for nausea.   oxyCODONE -acetaminophen  5-325 MG tablet Commonly known as: Endocet Take 1-2 tablets by mouth every 4 (four) hours as needed for up to 28 doses for severe pain (pain score 7-10).   pantoprazole  40 MG tablet Commonly known as: PROTONIX  Take 40 mg by mouth 2 (two) times daily.   potassium chloride  10 MEQ tablet Commonly known as: KLOR-CON  Take 10 mEq by mouth once.   predniSONE  20 MG tablet Commonly known as: DELTASONE  Take 20 mg by mouth daily.   QUEtiapine  25 MG tablet Commonly known as: SEROQUEL  Take 25 mg by mouth at bedtime.   simvastatin  10 MG tablet Commonly known as: ZOCOR  Take 10 mg by mouth at bedtime.   tiotropium 18 MCG inhalation capsule Commonly known as: SPIRIVA  Place 18 mcg into  inhaler and inhale daily.   tirzepatide  5 MG/0.5ML Pen Commonly known as: ZEPBOUND  Inject 5 mg into the skin once a week.   tiZANidine  4 MG tablet Commonly known as: ZANAFLEX  Take 4 mg by mouth every 8 (eight) hours as needed for muscle spasms.   traZODone  100 MG tablet Commonly known as: DESYREL  Take 2 tablets (200 mg total) by mouth at bedtime. Needs office visit   venlafaxine  XR 75 MG 24 hr capsule Commonly known as: EFFEXOR -XR Take 225 mg by mouth daily with breakfast.   Vitamin D  (Ergocalciferol ) 1.25 MG (50000 UNIT) Caps capsule Commonly known as: DRISDOL  Take 1 capsule (50,000 Units total) by mouth every 7 (seven) days for 12 doses.               Durable Medical Equipment  (From admission, onward)           Start     Ordered   01/24/24 1521  For home use only DME  oxygen  Once       Question Answer Comment  Length of Need 6 Months   Mode or (Route) Nasal cannula   Liters per Minute 4   Frequency Continuous (stationary and portable oxygen unit needed)   Oxygen conserving device Yes   Oxygen delivery system: Gas      01/24/24 1521            Discharge Exam: Filed Weights   01/22/24 1342  Weight: 83.9 kg   Constitutional:      General: She is not in acute distress. HENT:     Head: Normocephalic and atraumatic.  Cardiovascular:     Rate and Rhythm: Normal rate and regular rhythm.     Heart sounds: Normal heart sounds.  Pulmonary: Wheezing resolved Abdominal:     Palpations: Abdomen is soft.     Tenderness: There is no abdominal tenderness.  Neurological:     Mental Status: Mental status is at baseline.  Condition at discharge: good  The results of significant diagnostics from this hospitalization (including imaging, microbiology, ancillary and laboratory) are listed below for reference.   Imaging Studies: DG Chest 2 View Result Date: 01/22/2024 CLINICAL DATA:  Shortness of breath EXAM: DG CHEST 2V COMPARISON:  Eight days ago FINDINGS: Stable cardiomediastinal silhouette. Interval development of bilateral perihilar opacities concerning for pneumonia or possibly edema. Bony thorax is unremarkable. IMPRESSION: Interval development of bilateral perihilar opacities concerning for pneumonia or possibly edema. Electronically Signed   By: Lynwood Landy Raddle M.D.   On: 01/22/2024 14:20   US  Venous Img Lower Bilateral Result Date: 01/14/2024 CLINICAL DATA:  Immobile postop (surgery 12/20/2023) patient with pain and fever as well as edema of the lower extremities. EXAM: Bilateral LOWER EXTREMITY VENOUS DOPPLER ULTRASOUND TECHNIQUE: Gray-scale sonography with compression, as well as color and duplex ultrasound, were performed to evaluate the deep venous system(s) from the level of the common femoral vein through the popliteal and proximal calf  veins. COMPARISON:  None Available. FINDINGS: VENOUS Normal compressibility of the bilateral common femoral, superficial femoral, and popliteal veins, as well as the visualized calf veins. Visualized portions of the bilateral profunda femoral vein and great saphenous vein are unremarkable. No bilateral filling defects to suggest DVT on grayscale or color Doppler imaging. Doppler waveforms show normal direction of venous flow, normal respiratory plasticity and response to augmentation bilaterally. OTHER None. Limitations: none IMPRESSION: Negative for DVT in the bilateral lower extremities. Electronically Signed   By: Cordella Banner   On: 01/14/2024  09:28   DG Ankle Complete Left Result Date: 01/14/2024 EXAM: 3 OR MORE VIEW(S) XRAY OF THE LEFT ANKLE 01/14/2024 06:58:00 AM CLINICAL HISTORY: Potentially septic, with recent left ankle surgery 12/20/2023. COMPARISON: Last post operative x-rays on 01/02/2024. FINDINGS: BONES AND JOINTS: There is a side plate ORIF hardware with threaded screws securing the plate against the outer distal left fibular shaft and malleolus. Two threaded screws into the medial malleolus obliquely. No hardware loosening or complication is seen. The fracture alignment is unchanged and near anatomic. Fracture lines are slightly less well visible today, probably due to progressive interval healing. Joint spaces are maintained. There are small dorsal and plantar calcaneal spurs. There is mild osteopenia. No destructive osseous lesion. SOFT TISSUES: There is mild circumferential soft tissue swelling which is also improved. IMPRESSION: 1. Status post left ankle ORIF with near anatomic alignment and no hardware complication. Fracture lines are less well visible, likely due to interval healing. 2. Mild circumferential soft tissue swelling, improved compared to prior study. Electronically signed by: Francis Quam MD 01/14/2024 07:18 AM EST RP Workstation: [id number]   DG Chest Portable 1  View Result Date: 01/14/2024 EXAM: 1 VIEW(S) XRAY OF THE CHEST 01/14/2024 06:58:00 AM COMPARISON: Portable chest 09/27/2019. CLINICAL HISTORY: potentially septic potentially septic FINDINGS: LINES, TUBES AND DEVICES: There are overlying monitor leads. LUNGS AND PLEURA: The lungs are clear of infiltrates, with postsurgical changes again noted in the right mid lung. No pleural effusion. No pneumothorax. HEART AND MEDIASTINUM: There is mild cardiomegaly, with no evidence of CHF. The mediastinum is normally outlined. BONES AND SOFT TISSUES: No acute osseous abnormality. IMPRESSION: 1. No acute findings. 2. Cardiomegaly. Electronically signed by: Francis Quam MD 01/14/2024 07:09 AM EST RP Workstation: [id number]   DG Ankle Complete Left Result Date: 01/06/2024 Please see detailed radiograph report in office note.   Microbiology: Results for orders placed or performed during the hospital encounter of 01/14/24  Resp panel by RT-PCR (RSV, Flu A&B, Covid) Anterior Nasal Swab     Status: Abnormal   Collection Time: 01/14/24  6:30 AM   Specimen: Anterior Nasal Swab  Result Value Ref Range Status   SARS Coronavirus 2 by RT PCR NEGATIVE NEGATIVE Final    Comment: (NOTE) SARS-CoV-2 target nucleic acids are NOT DETECTED.  The SARS-CoV-2 RNA is generally detectable in upper respiratory specimens during the acute phase of infection. The lowest concentration of SARS-CoV-2 viral copies this assay can detect is 138 copies/mL. A negative result does not preclude SARS-Cov-2 infection and should not be used as the sole basis for treatment or other patient management decisions. A negative result may occur with  improper specimen collection/handling, submission of specimen other than nasopharyngeal swab, presence of viral mutation(s) within the areas targeted by this assay, and inadequate number of viral copies(<138 copies/mL). A negative result must be combined with clinical observations, patient history, and  epidemiological information. The expected result is Negative.  Fact Sheet for Patients:  bloggercourse.com  Fact Sheet for Healthcare Providers:  seriousbroker.it  This test is no t yet approved or cleared by the [place]  FDA and  has been authorized for detection and/or diagnosis of SARS-CoV-2 by FDA under an Emergency Use Authorization (EUA). This EUA will remain  in effect (meaning this test can be used) for the duration of the COVID-19 declaration under Section 564(b)(1) of the Act, 21 U.S.C.section 360bbb-3(b)(1), unless the authorization is terminated  or revoked sooner.       Influenza A by PCR POSITIVE (A) NEGATIVE Final  Influenza B by PCR NEGATIVE NEGATIVE Final    Comment: (NOTE) The Xpert Xpress SARS-CoV-2/FLU/RSV plus assay is intended as an aid in the diagnosis of influenza from Nasopharyngeal swab specimens and should not be used as a sole basis for treatment. Nasal washings and aspirates are unacceptable for Xpert Xpress SARS-CoV-2/FLU/RSV testing.  Fact Sheet for Patients: bloggercourse.com  Fact Sheet for Healthcare Providers: seriousbroker.it  This test is not yet approved or cleared by the [facility]  FDA and has been authorized for detection and/or diagnosis of SARS-CoV-2 by FDA under an Emergency Use Authorization (EUA). This EUA will remain in effect (meaning this test can be used) for the duration of the COVID-19 declaration under Section 564(b)(1) of the Act, 21 U.S.C. section 360bbb-3(b)(1), unless the authorization is terminated or revoked.     Resp Syncytial Virus by PCR NEGATIVE NEGATIVE Final    Comment: (NOTE) Fact Sheet for Patients: bloggercourse.com  Fact Sheet for Healthcare Providers: seriousbroker.it  This test is not yet approved or cleared by the [place]  FDA and has  been authorized for detection and/or diagnosis of SARS-CoV-2 by FDA under an Emergency Use Authorization (EUA). This EUA will remain in effect (meaning this test can be used) for the duration of the COVID-19 declaration under Section 564(b)(1) of the Act, 21 U.S.C. section 360bbb-3(b)(1), unless the authorization is terminated or revoked.  Performed at [facility], [address] [place], [name] [address]     Labs: CBC: Recent Labs  Lab 01/22/24 1346  WBC 9.1  HGB 11.4*  HCT 34.3*  MCV 89.3  PLT 281   Basic Metabolic Panel: Recent Labs  Lab 01/22/24 1346 01/22/24 1546 01/23/24 1108  NA 136  --  137  K 3.2*  --  3.4*  CL 99  --  100  CO2 28  --  27  GLUCOSE 143*  --  231*  BUN 6  --  10  CREATININE 0.55  --  0.62  CALCIUM 8.3*  --  8.5*  MG  --  2.3  --    Liver Function Tests: No results for input(s): AST, ALT, ALKPHOS, BILITOT, PROT, ALBUMIN in the last 168 hours. CBG: No results for input(s): GLUCAP in the last 168 hours.  Discharge time spent:  37 minutes.  Signed: Drue ONEIDA Potter, MD [facility] 01/24/2024 "

## 2024-01-24 NOTE — Progress Notes (Signed)
 Patient does not have home O2 concentrator to go home with. She is on 4L at this time.

## 2024-01-24 NOTE — Plan of Care (Signed)

## 2024-01-24 NOTE — Progress Notes (Signed)
 SATURATION QUALIFICATIONS: (This note is used to comply with regulatory documentation for home oxygen)   Patient Saturations on Room Air at Rest = 90%   Patient Saturations on [employer] while Stand, Pivot, Transfer to the Bedside Commode = 85%   Patient Saturations on 4 Liters of oxygen while Transferring = 95%   Please briefly explain why patient needs home oxygen:The patient has been on home oxygen chronically, but she states she has not been using portable oxygen when she is in the car going places, she only has an oxygen concentrator. Patient is not safe to transport home on room air, she needs a portable oxygen cylinder for when she is transporting.   Katie Neal [name]

## 2024-01-25 LAB — CBC WITH DIFFERENTIAL/PLATELET
Abs Immature Granulocytes: 0.75 K/uL — ABNORMAL HIGH (ref 0.00–0.07)
Basophils Absolute: 0.1 K/uL (ref 0.0–0.1)
Basophils Relative: 1 %
Eosinophils Absolute: 0 K/uL (ref 0.0–0.5)
Eosinophils Relative: 0 %
HCT: 35.2 % — ABNORMAL LOW (ref 36.0–46.0)
Hemoglobin: 11.8 g/dL — ABNORMAL LOW (ref 12.0–15.0)
Immature Granulocytes: 9 %
Lymphocytes Relative: 25 %
Lymphs Abs: 2.2 K/uL (ref 0.7–4.0)
MCH: 30.2 pg (ref 26.0–34.0)
MCHC: 33.5 g/dL (ref 30.0–36.0)
MCV: 90 fL (ref 80.0–100.0)
Monocytes Absolute: 1.2 K/uL — ABNORMAL HIGH (ref 0.1–1.0)
Monocytes Relative: 14 %
Neutro Abs: 4.6 K/uL (ref 1.7–7.7)
Neutrophils Relative %: 51 %
Platelets: 365 K/uL (ref 150–400)
RBC: 3.91 MIL/uL (ref 3.87–5.11)
RDW: 14.4 % (ref 11.5–15.5)
Smear Review: NORMAL
WBC: 8.9 K/uL (ref 4.0–10.5)
nRBC: 0.3 % — ABNORMAL HIGH (ref 0.0–0.2)

## 2024-01-25 LAB — BASIC METABOLIC PANEL WITH GFR
Anion gap: 8 (ref 5–15)
BUN: 6 mg/dL (ref 6–20)
CO2: 31 mmol/L (ref 22–32)
Calcium: 8.7 mg/dL — ABNORMAL LOW (ref 8.9–10.3)
Chloride: 98 mmol/L (ref 98–111)
Creatinine, Ser: 0.62 mg/dL (ref 0.44–1.00)
GFR, Estimated: >60 mL/min
Glucose, Bld: 118 mg/dL — ABNORMAL HIGH (ref 70–99)
Potassium: 3.8 mmol/L (ref 3.5–5.1)
Sodium: 137 mmol/L (ref 135–145)

## 2024-01-25 MED ORDER — LEVORPHANOL TARTRATE 2 MG PO TABS
2.0000 mg | ORAL_TABLET | Freq: Three times a day (TID) | ORAL | Status: DC | PRN
  Administered 2024-01-25: 2 mg via ORAL
  Filled 2024-01-25 (×2): qty 1

## 2024-01-25 MED ORDER — AZITHROMYCIN 250 MG PO TABS: 500.0000 mg | ORAL_TABLET | Freq: Every day | ORAL | Status: DC

## 2024-01-25 NOTE — TOC Progression Note (Signed)
 Transition of Care [facility]) - Progression Note    Patient Details  Name: Katie Neal MRN: [id number] Date of Birth: 1970/09/06  Transition of [facility]) CM/SW Contact  Victory Jackquline RAMAN, RN Phone Number: 01/25/2024, 11:46 AM  Clinical Narrative:    RNCM received a message via secure chat from charge nurse informing me that this patient's oxygen cylinders were leaking and to have DME company to come out to fix the tanks. She said Three nurses and two respiratory staff checked the tanks last night, including myself.   RNCM notified Mitch with [employer] that the patient's oxygen tank is leaking and he sent an email to the weekend intake requesting the tank be replaced. Charge Nurse, MD and bedside nurse made aware.      Barriers to Discharge: Barriers Resolved               Expected Discharge Plan and Services         Expected Discharge Date: 01/24/24               DME Arranged: Oxygen DME Agency: [employer] Date DME Agency Contacted: 01/24/24   Representative spoke with at DME Agency: Thomasina [place]             Social Drivers of Health (SDOH) Interventions SDOH Screenings   Food Insecurity: No Food Insecurity (01/23/2024)  Housing: Low Risk (01/23/2024)  Transportation Needs: Unmet Transportation Needs (01/23/2024)  Utilities: Not At Risk (01/23/2024)  Tobacco Use: Medium Risk (01/22/2024)    Readmission Risk Interventions    12/21/2023    4:00 PM  Readmission Risk Prevention Plan  Transportation Screening Complete  Medication Review (RN Care Manager) Complete  PCP or Specialist appointment within 3-5 days of discharge Complete  HRI or Home Care Consult Complete  SW Recovery Care/Counseling Consult Complete  Palliative Care Screening Not Applicable  Skilled Nursing Facility Not Applicable

## 2024-02-05 ENCOUNTER — Ambulatory Visit

## 2024-02-05 ENCOUNTER — Ambulatory Visit (INDEPENDENT_AMBULATORY_CARE_PROVIDER_SITE_OTHER)

## 2024-02-05 DIAGNOSIS — S82892A Other fracture of left lower leg, initial encounter for closed fracture: Secondary | ICD-10-CM

## 2024-02-06 ENCOUNTER — Encounter

## 2024-02-10 NOTE — Progress Notes (Signed)
 "    Subjective:  Patient ID: Katie Neal, female    DOB: March 05, 1970,  MRN: [id number]  Chief Complaint  Patient presents with   Routine Post Op    DOS: 12/20/23 Procedure: left ankle trimalleolar ORIF  54 y.o. female returns for post-op check.  She is 7 weeks status post left ankle trimalleolar ORIF without fixation of the posterior malleolus.  She left her rehab facility. She did present to the ED with severe pain since I last saw her. I prescribed oxycodone /acetaminophen  5/325 which patient has used sparingly.  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  She has been compliant with minimal weight bearing. She is using CAM walker when she tolerates it. She has been taking it off against instruction and does not have any shoegear on today.  Review of Systems: Negative except as noted in the HPI. Denies N/V/F/Ch.  Past Medical History:  Diagnosis Date   Arthritis    Asthma    Cardiomegaly    Cerebral atrophy    Complex regional pain syndrome type 1    COPD (chronic obstructive pulmonary disease) (HCC)    CRPS (complex regional pain syndrome type I)    Depression    Diaphragmatic hernia    Dyspnea    Excessive falling    Headache    History of chronic sinusitis    History of kidney stones    Hyperlipidemia    Hypothyroidism    IBS (irritable bowel syndrome)    Insomnia    Memory difficulty    Osteoarthritis of knee    Pneumonia    Post concussion syndrome    PTSD (post-traumatic stress disorder)    Seizure-like activity (HCC)    Sleep apnea    not on Cpap machine but is on oxygen    Urinary incontinence    Vasovagal syncope    Current Medications[1]  Tobacco Use History[2]  Allergies[3] Objective:  There were no vitals filed for this visit. There is no height or weight on file to calculate BMI. Constitutional Well developed. Well nourished.  Vascular Foot warm and well perfused. Capillary refill normal to all digits.  Calf supple, nontender, no erythema,  negative Homan.  Neurologic Normal speech. Oriented to person, place, and time. Epicritic sensation to light touch grossly present bilaterally. No hypersensitivity to incisional sites.   Dermatologic Skin well healed without signs of infection.  Mild edema around surgical site. Ecchymosis absent, improved.  Orthopedic: Mild tenderness to palpation noted about the surgical site within normal limits.  Medial malleoli are screws unable to be palpated. Patient is able to manipulate her ankle.     Radiographs: 3 views of the left ankle were taken today. These show stable hardware and good alignment status post ankle fracture. There is dulling of fracture lines at fibula and medial malleolus, consistent with early osseous healing. Fracture lines remain lucent.  Assessment:   1. Closed fracture of left ankle, initial encounter     Plan:  Patient was evaluated and treated and all questions answered. Doing well in post operative recovery.   S/p Left ankle surgery  -Progressing as expected post-operatively.  Doing well in her recovery. She states the pain she is experiencing feels like her CRPS/nerve pathology.  -XR: as above -WB Status: Transition to full weight bearing in CAM walker. - Emphasized importance of wearing her CAM walker while weight bearing over next few weeks. She does not tolerate lace up ankle braces.  -Medications: Not needed -Foot redressed  and she was placed back into CAM walker.  -XR at next visit: left ankle  Return to clinic 4 weeks with pre-clinical XR  Prentice Ovens, D.P.M.     [1]  Current Outpatient Medications:    albuterol  (PROVENTIL ) (2.5 MG/3ML) 0.083% nebulizer solution, Take 2.5 mg by nebulization every 6 (six) hours as needed for wheezing or shortness of breath. , Disp: , Rfl:    amitriptyline  (ELAVIL ) 25 MG tablet, Take 25 mg by mouth at bedtime. , Disp: , Rfl:    budesonide  (PULMICORT ) 0.25 MG/2ML nebulizer solution, Take 0.25 mg by nebulization 2 (two)  times daily., Disp: , Rfl:    budesonide -formoterol  (SYMBICORT) 80-4.5 MCG/ACT inhaler, Inhale 2 puffs into the lungs 2 (two) times daily., Disp: , Rfl:    dicyclomine  (BENTYL ) 20 MG tablet, Take 20 mg by mouth 3 (three) times daily before meals., Disp: , Rfl:    doxepin  (SINEQUAN ) 10 MG capsule, Take 10 mg by mouth at bedtime as needed., Disp: , Rfl:    EMGALITY  120 MG/ML SOAJ, Inject 120 mg into the skin every 30 (thirty) days., Disp: , Rfl:    fluticasone  (FLONASE ) 50 MCG/ACT nasal spray, Place 2 sprays into both nostrils daily as needed. , Disp: , Rfl:    gabapentin  (NEURONTIN ) 100 MG capsule, Take 100 mg by mouth daily., Disp: , Rfl:    gabapentin  (NEURONTIN ) 100 MG capsule, Take 300 mg by mouth at bedtime., Disp: , Rfl:    guaiFENesin  (MUCINEX ) 600 MG 12 hr tablet, Take 600 mg by mouth 2 (two) times daily., Disp: , Rfl:    hydrOXYzine  (ATARAX ) 25 MG tablet, Take 25 mg by mouth 3 (three) times daily as needed., Disp: , Rfl:    lamoTRIgine  (LAMICTAL ) 100 MG tablet, Take 100-200 mg by mouth See admin instructions. Take 1 tablet (100mg ) by mouth every morning and take 2 tablets (200mg ) by mouth at bedtime, Disp: , Rfl:    levocetirizine (XYZAL ) 5 MG tablet, SMARTSIG:1 Tablet(s) By Mouth Every Evening, Disp: , Rfl:    levorphanol (LEVODROMORAN) 2 MG tablet, Take 2 mg by mouth every 8 (eight) hours as needed., Disp: , Rfl:    levothyroxine  (SYNTHROID , LEVOTHROID) 137 MCG tablet, Take 1 tablet by mouth 6 days/week. Take half tablet (68.5 mcg) on Sunday. Take on an empty stomach with a glass of water at least 30-60 minutes before breakfast., Disp: , Rfl:    linaclotide  (LINZESS ) 290 MCG CAPS capsule, Take 1 capsule (290 mcg total) by mouth at bedtime for 1 day., Disp: 1 capsule, Rfl: 0   Mepolizumab  (NUCALA ) 100 MG/ML SOAJ, Inject 1 mL (100 mg total) into the skin every 28 (twenty-eight) days., Disp: , Rfl:    metoprolol  succinate (TOPROL -XL) 25 MG 24 hr tablet, Take 25 mg by mouth daily., Disp: , Rfl:     montelukast  (SINGULAIR ) 10 MG tablet, Take 10 mg by mouth daily., Disp: , Rfl:    Multiple Vitamin (MULTIVITAMIN WITH MINERALS) TABS tablet, Take 1 tablet by mouth daily., Disp: , Rfl:    Olopatadine  HCl 0.7 % SOLN, Place 1 drop into both eyes 2 (two) times daily as needed (allergy symptoms). , Disp: , Rfl:    ondansetron  (ZOFRAN -ODT) 4 MG disintegrating tablet, Take 4 mg by mouth every 8 (eight) hours as needed for nausea., Disp: , Rfl:    oxyCODONE -acetaminophen  (ENDOCET) 5-325 MG tablet, Take 1-2 tablets by mouth every 4 (four) hours as needed for up to 28 doses for severe pain (pain score 7-10)., Disp: 28  tablet, Rfl: 0   pantoprazole  (PROTONIX ) 40 MG tablet, Take 40 mg by mouth 2 (two) times daily., Disp: , Rfl:    potassium chloride  (KLOR-CON ) 10 MEQ tablet, Take 10 mEq by mouth once., Disp: , Rfl:    predniSONE  (DELTASONE ) 20 MG tablet, Take 20 mg by mouth daily., Disp: , Rfl:    QUEtiapine  (SEROQUEL ) 25 MG tablet, Take 25 mg by mouth at bedtime., Disp: , Rfl:    simvastatin  (ZOCOR ) 10 MG tablet, Take 10 mg by mouth at bedtime. , Disp: , Rfl:    tiotropium (SPIRIVA ) 18 MCG inhalation capsule, Place 18 mcg into inhaler and inhale daily., Disp: , Rfl:    tirzepatide  (ZEPBOUND ) 5 MG/0.5ML Pen, Inject 5 mg into the skin once a week., Disp: , Rfl:    tiZANidine  (ZANAFLEX ) 4 MG tablet, Take 4 mg by mouth every 8 (eight) hours as needed for muscle spasms., Disp: , Rfl:    traZODone  (DESYREL ) 100 MG tablet, Take 2 tablets (200 mg total) by mouth at bedtime. Needs office visit, Disp: 60 tablet, Rfl: 0   venlafaxine  XR (EFFEXOR -XR) 75 MG 24 hr capsule, Take 225 mg by mouth daily with breakfast., Disp: , Rfl:    VENTOLIN  HFA 108 (90 Base) MCG/ACT inhaler, Inhale 2 puffs into the lungs every 6 (six) hours as needed for wheezing., Disp: , Rfl:    vitamin C  (ASCORBIC ACID ) 500 MG tablet, Take 1,000 mg by mouth daily., Disp: , Rfl:    Vitamin D , Ergocalciferol , (DRISDOL ) 1.25 MG (50000 UNIT) CAPS  capsule, Take 1 capsule (50,000 Units total) by mouth every 7 (seven) days for 12 doses., Disp: 12 capsule, Rfl: 0 [2]  Social History Tobacco Use  Smoking Status Former   Current packs/day: 0.00   Types: Cigarettes   Quit date: 10/22/2013   Years since quitting: 10.3  Smokeless Tobacco Never  [3]  Allergies Allergen Reactions   Codeine Itching    Can take with benadryl     "

## 2024-03-11 ENCOUNTER — Ambulatory Visit
